# Patient Record
Sex: Female | Born: 1950 | Race: Black or African American | Hispanic: No | Marital: Married | Smoking: Former smoker
Health system: Southern US, Community
[De-identification: ages and names within clinical notes are randomized; demographics above are authoritative.]

## PROBLEM LIST (undated history)

## (undated) DIAGNOSIS — E785 Hyperlipidemia, unspecified: Secondary | ICD-10-CM

## (undated) DIAGNOSIS — I10 Essential (primary) hypertension: Secondary | ICD-10-CM

## (undated) DIAGNOSIS — I255 Ischemic cardiomyopathy: Secondary | ICD-10-CM

## (undated) DIAGNOSIS — I2510 Atherosclerotic heart disease of native coronary artery without angina pectoris: Secondary | ICD-10-CM

## (undated) DIAGNOSIS — D649 Anemia, unspecified: Secondary | ICD-10-CM

## (undated) HISTORY — DX: Essential (primary) hypertension: I10

## (undated) HISTORY — DX: Anemia, unspecified: D64.9

---

## 1998-01-05 HISTORY — PX: ANGIOPLASTY: SHX39

## 2000-05-30 ENCOUNTER — Emergency Department (HOSPITAL_COMMUNITY): Admission: EM | Admit: 2000-05-30 | Discharge: 2000-05-30 | Payer: Self-pay | Admitting: Emergency Medicine

## 2000-07-08 ENCOUNTER — Encounter: Payer: Self-pay | Admitting: Emergency Medicine

## 2000-07-08 ENCOUNTER — Emergency Department (HOSPITAL_COMMUNITY): Admission: EM | Admit: 2000-07-08 | Discharge: 2000-07-08 | Payer: Self-pay | Admitting: Emergency Medicine

## 2001-07-15 ENCOUNTER — Emergency Department (HOSPITAL_COMMUNITY): Admission: EM | Admit: 2001-07-15 | Discharge: 2001-07-15 | Payer: Self-pay | Admitting: Emergency Medicine

## 2001-07-15 ENCOUNTER — Encounter: Payer: Self-pay | Admitting: Emergency Medicine

## 2001-10-08 ENCOUNTER — Emergency Department (HOSPITAL_COMMUNITY): Admission: EM | Admit: 2001-10-08 | Discharge: 2001-10-08 | Payer: Self-pay | Admitting: Emergency Medicine

## 2002-10-24 ENCOUNTER — Encounter: Admission: RE | Admit: 2002-10-24 | Discharge: 2002-10-24 | Payer: Self-pay | Admitting: Internal Medicine

## 2002-10-24 ENCOUNTER — Encounter: Payer: Self-pay | Admitting: Internal Medicine

## 2003-12-18 ENCOUNTER — Emergency Department (HOSPITAL_COMMUNITY): Admission: EM | Admit: 2003-12-18 | Discharge: 2003-12-18 | Payer: Self-pay | Admitting: Emergency Medicine

## 2004-02-07 ENCOUNTER — Emergency Department (HOSPITAL_COMMUNITY): Admission: EM | Admit: 2004-02-07 | Discharge: 2004-02-07 | Payer: Self-pay | Admitting: Emergency Medicine

## 2004-02-08 ENCOUNTER — Ambulatory Visit (HOSPITAL_COMMUNITY): Admission: RE | Admit: 2004-02-08 | Discharge: 2004-02-08 | Payer: Self-pay | Admitting: Emergency Medicine

## 2004-08-26 ENCOUNTER — Emergency Department (HOSPITAL_COMMUNITY): Admission: EM | Admit: 2004-08-26 | Discharge: 2004-08-26 | Payer: Self-pay | Admitting: Emergency Medicine

## 2006-09-28 ENCOUNTER — Other Ambulatory Visit: Admission: RE | Admit: 2006-09-28 | Discharge: 2006-09-28 | Payer: Self-pay | Admitting: Obstetrics and Gynecology

## 2010-01-26 ENCOUNTER — Encounter: Payer: Self-pay | Admitting: Internal Medicine

## 2012-02-10 ENCOUNTER — Ambulatory Visit: Payer: BC Managed Care – PPO | Admitting: Family Medicine

## 2012-02-10 ENCOUNTER — Ambulatory Visit: Payer: BC Managed Care – PPO

## 2012-02-10 VITALS — BP 179/94 | HR 80 | Temp 98.4°F | Resp 17 | Ht 63.5 in | Wt 141.0 lb

## 2012-02-10 DIAGNOSIS — S92911A Unspecified fracture of right toe(s), initial encounter for closed fracture: Secondary | ICD-10-CM

## 2012-02-10 DIAGNOSIS — I10 Essential (primary) hypertension: Secondary | ICD-10-CM

## 2012-02-10 DIAGNOSIS — S92919A Unspecified fracture of unspecified toe(s), initial encounter for closed fracture: Secondary | ICD-10-CM

## 2012-02-10 DIAGNOSIS — M79674 Pain in right toe(s): Secondary | ICD-10-CM

## 2012-02-10 DIAGNOSIS — M79609 Pain in unspecified limb: Secondary | ICD-10-CM

## 2012-02-10 NOTE — Progress Notes (Signed)
Discussed with server Weber PA. The patient stubbed the toe pretty well. X-ray does reveal an apparent fracture of the proximal phalanx of the fifth toe. Treatment was discussed. Agree with the PAs assessment.

## 2012-02-10 NOTE — Progress Notes (Signed)
   [address], [place] [address]   Phone [phone]  Subjective:    Patient ID: Diamond Hughes, female    DOB: May 08, 1950, 62 y.o.   MRN: [id number]  HPI  Pt presents to clinic with R 5th toe pain after she ran it into her bed Sunday night at home - She was limping at work due to the pain and they sent her here because they stated her limping was a hazard.  She has done nothing for her toe. Pt has h/o HTN and is on daily medications.    Review of Systems  Musculoskeletal: Positive for gait problem (2nd to pain). Negative for joint swelling.      Objective:   Physical Exam  Constitutional: She is oriented to person, place, and time. She appears well-developed and well-nourished.  HENT:  Head: Normocephalic and atraumatic.  Right Ear: External ear normal.  Left Ear: External ear normal.  Musculoskeletal:       5th toe TTP without swelling or deformity.  Some TTP of 4th toe at the MTP.  Neurological: She is alert and oriented to person, place, and time.  Skin: Skin is warm and dry.    [facility] reading (PRIMARY) by  Dr. Alwyn Ren.  Fracture 5th proximal phalanx that does not involve the MTP joint.        Assessment & Plan:   1. Toe pain, right  DG Foot Complete Right  2. Fracture of right toe    3. HTN (hypertension)     1/2- post op shoe and buddy taping.  Note given for work today because pt is not allowed to wear open toe shoes and she is wearing rocker bottom shoes today.  She can use OTC NSAIDs. 3- pt to monitor her BP - she may need an increase in her medication.

## 2016-02-02 ENCOUNTER — Emergency Department (HOSPITAL_COMMUNITY): Payer: Self-pay

## 2016-02-02 ENCOUNTER — Encounter (HOSPITAL_COMMUNITY): Payer: Self-pay | Admitting: Emergency Medicine

## 2016-02-02 ENCOUNTER — Observation Stay (HOSPITAL_COMMUNITY)
Admission: EM | Admit: 2016-02-02 | Discharge: 2016-02-03 | Discharge status: Against Medical Advice | Payer: Self-pay | Attending: Internal Medicine | Admitting: Internal Medicine

## 2016-02-02 DIAGNOSIS — D649 Anemia, unspecified: Secondary | ICD-10-CM | POA: Insufficient documentation

## 2016-02-02 DIAGNOSIS — I10 Essential (primary) hypertension: Secondary | ICD-10-CM | POA: Insufficient documentation

## 2016-02-02 DIAGNOSIS — F17210 Nicotine dependence, cigarettes, uncomplicated: Secondary | ICD-10-CM | POA: Insufficient documentation

## 2016-02-02 DIAGNOSIS — E876 Hypokalemia: Secondary | ICD-10-CM | POA: Insufficient documentation

## 2016-02-02 DIAGNOSIS — I252 Old myocardial infarction: Secondary | ICD-10-CM | POA: Insufficient documentation

## 2016-02-02 DIAGNOSIS — R079 Chest pain, unspecified: Principal | ICD-10-CM | POA: Insufficient documentation

## 2016-02-02 DIAGNOSIS — E871 Hypo-osmolality and hyponatremia: Secondary | ICD-10-CM | POA: Insufficient documentation

## 2016-02-02 DIAGNOSIS — I2510 Atherosclerotic heart disease of native coronary artery without angina pectoris: Secondary | ICD-10-CM | POA: Insufficient documentation

## 2016-02-02 DIAGNOSIS — Z955 Presence of coronary angioplasty implant and graft: Secondary | ICD-10-CM | POA: Insufficient documentation

## 2016-02-02 LAB — CBC
HEMATOCRIT: 32.2 % — AB (ref 36.0–46.0)
HEMOGLOBIN: 10.6 g/dL — AB (ref 12.0–15.0)
MCH: 31.6 pg (ref 26.0–34.0)
MCHC: 32.9 g/dL (ref 30.0–36.0)
MCV: 96.1 fL (ref 78.0–100.0)
PLATELETS: 447 10*3/uL — AB (ref 150–400)
RBC: 3.35 MIL/uL — AB (ref 3.87–5.11)
RDW: 12.8 % (ref 11.5–15.5)
WBC: 7.6 10*3/uL (ref 4.0–10.5)

## 2016-02-02 LAB — HEPATIC FUNCTION PANEL
ALBUMIN: 4.3 g/dL (ref 3.5–5.0)
ALK PHOS: 35 U/L — AB (ref 38–126)
ALT: 14 U/L (ref 14–54)
AST: 25 U/L (ref 15–41)
Bilirubin, Direct: <0.1 mg/dL — ABNORMAL LOW (ref 0.1–0.5)
TOTAL PROTEIN: 7.8 g/dL (ref 6.5–8.1)
Total Bilirubin: <0.1 mg/dL — ABNORMAL LOW (ref 0.3–1.2)

## 2016-02-02 LAB — I-STAT TROPONIN, ED: Troponin i, poc: 0.04 ng/mL (ref 0.00–0.08)

## 2016-02-02 LAB — LIPASE, BLOOD: Lipase: 30 U/L (ref 11–51)

## 2016-02-02 LAB — BASIC METABOLIC PANEL
Anion gap: 10 (ref 5–15)
BUN: 15 mg/dL (ref 6–20)
CHLORIDE: 98 mmol/L — AB (ref 101–111)
CO2: 25 mmol/L (ref 22–32)
CREATININE: 0.78 mg/dL (ref 0.44–1.00)
Calcium: 8.9 mg/dL (ref 8.9–10.3)
GFR calc non Af Amer: >60 mL/min (ref >60)
Glucose, Bld: 103 mg/dL — ABNORMAL HIGH (ref 65–99)
POTASSIUM: 3.3 mmol/L — AB (ref 3.5–5.1)
Sodium: 133 mmol/L — ABNORMAL LOW (ref 135–145)

## 2016-02-02 MED ORDER — NITROGLYCERIN 2 % TD OINT
1.0000 [in_us] | TOPICAL_OINTMENT | Freq: Once | TRANSDERMAL | Status: AC
  Administered 2016-02-02: 1 [in_us] via TOPICAL
  Filled 2016-02-02: qty 1

## 2016-02-02 NOTE — ED Triage Notes (Addendum)
Pt reports chest pain that started yesterday. Pt reports tightness and pressure substernally that radiates into back. Pt stated she had prior stents placed after an MI 18 years ago. Pt also reported shortness of breath and dizziness. Pt reports taking 325mg  ASA at 2130

## 2016-02-02 NOTE — ED Notes (Signed)
Dr. James Kim, hospitalist, at bedside. 

## 2016-02-02 NOTE — H&P (Signed)
[facility] H&P   Patient Demographics:    Diamond Hughes, is a 66 y.o. female  MRN: [id number]   DOB - 07-Sep-1950  Admit Date - 02/02/2016  Outpatient Primary MD for the patient is No primary care provider on file.  Referring MD/NP/PA: Dr. Aileen PilotZammitt  Outpatient Specialists:   Patient coming from: home  Chief Complaint  Patient presents with  . Chest Pain      HPI:    Diamond Hughes  is a 66 y.o. female, w hx of CAD s/p stent  ([place], [place]), apparently c/o chest pain substernal, without radiation. "achy and pressure"   ? Slight back pain as well. ,pt not sure if related to chest pain.  Pt was sitting watching TV when the pain began.  Started about 9pm.  Lasted about 15 minutes. Aspirin might have helped.  Otherwise nothing appeared to make the pain better or worse. Slight dyspnea.  Denies fever, chill, cough, palp, n/v, heartburn.   In ED , CXR negative. EKG nsr at 75, nl axis, q in v1, v2, and slight st depression the lateral leads.  Trop negative.  Pt noted to have mild anemia with hgb 10.6  And hypokalemia, K=3.3.  Pt will be admitted for CP     Review of systems:    In addition to the HPI above,  No Fever-chills, No Headache, No changes with Vision or hearing, No problems swallowing food or Liquids, No Abdominal pain, No Nausea or Vommitting, Bowel movements are regular, No Blood in stool or Urine, No dysuria, No new skin rashes or bruises, No new joints pains-aches,  No new weakness, tingling, numbness in any extremity, No recent weight gain or loss, No polyuria, polydypsia or polyphagia, No significant Mental Stressors.  A full 10 point Review of Systems was done, except as stated above, all other Review of Systems were negative.   With Past History of the following :    Past Medical History:  Diagnosis Date  . Hypertension   . MI (myocardial infarction)        Past Surgical History:  Procedure Laterality Date  . ANGIOPLASTY  2000      Social History:     Social History  Substance Use Topics  . Smoking status: Current Every Day Smoker    Packs/day: 0.20    Years: 15.00    Types: Cigarettes  . Smokeless tobacco: Never Used  . Alcohol use Yes     Comment: 40oz beer     Lives - at home  Mobility - walks by self     Family History :     Family History  Problem Relation Age of Onset  . Heart disease Mother       Home Medications:   Prior to Admission medications   Not on File     Allergies:    No Known Allergies   Physical Exam:  Vitals  Blood pressure 130/78, pulse 69, temperature 97.8 F (36.6 C), temperature source Oral, resp. rate 14, height 5\' 3"  (1.6 m), weight 68.9 kg (152 lb), SpO2 95 %.   1. General  lying in bed in NAD,    2. Normal affect and insight, Not Suicidal or Homicidal, Awake Alert, Oriented X 3.  3. No F.N deficits, ALL C.Nerves Intact, Strength 5/5 all 4 extremities, Sensation intact all 4 extremities, Plantars down going.  4. Ears and Eyes appear Normal, Conjunctivae clear, PERRLA. Moist Oral Mucosa.  5. Supple Neck, No JVD, No cervical lymphadenopathy appriciated, No Carotid Bruits.  6. Symmetrical Chest wall movement, Good air movement bilaterally, CTAB.  7. RRR, No Gallops, Rubs or Murmurs, No Parasternal Heave.  8. Positive Bowel Sounds, Abdomen Soft, No tenderness, No organomegaly appriciated,No rebound -guarding or rigidity.  9.  No Cyanosis, Normal Skin Turgor, No Skin Rash or Bruise.  10. Good muscle tone,  joints appear normal , no effusions, Normal ROM.  11. No Palpable Lymph Nodes in Neck or Axillae    Data Review:    CBC  Recent Labs Lab 02/02/16 2154  WBC 7.6  HGB 10.6*  HCT 32.2*  PLT 447*  MCV 96.1  MCH 31.6  MCHC 32.9  RDW 12.8    ------------------------------------------------------------------------------------------------------------------  Chemistries   Recent Labs Lab 02/02/16 2154  NA 133*  K 3.3*  CL 98*  CO2 25  GLUCOSE 103*  BUN 15  CREATININE 0.78  CALCIUM 8.9  AST 25  ALT 14  ALKPHOS 35*  BILITOT <0.1*   ------------------------------------------------------------------------------------------------------------------ estimated creatinine clearance is 67 mL/min (by C-G formula based on SCr of 0.78 mg/dL). ------------------------------------------------------------------------------------------------------------------ No results for input(s): TSH, T4TOTAL, T3FREE, THYROIDAB in the last 72 hours.  Invalid input(s): FREET3  Coagulation profile No results for input(s): INR, PROTIME in the last 168 hours. ------------------------------------------------------------------------------------------------------------------- No results for input(s): DDIMER in the last 72 hours. -------------------------------------------------------------------------------------------------------------------  Cardiac Enzymes No results for input(s): CKMB, TROPONINI, MYOGLOBIN in the last 168 hours.  Invalid input(s): CK ------------------------------------------------------------------------------------------------------------------ No results found for: BNP   ---------------------------------------------------------------------------------------------------------------  Urinalysis No results found for: COLORURINE, APPEARANCEUR, LABSPEC, PHURINE, GLUCOSEU, HGBUR, BILIRUBINUR, KETONESUR, PROTEINUR, UROBILINOGEN, NITRITE, LEUKOCYTESUR  ----------------------------------------------------------------------------------------------------------------   Imaging Results:    Dg Chest 2 View  Result Date: 02/02/2016 CLINICAL DATA:  Shortness of breath with cough and congestion 2 days. EXAM: CHEST  2 VIEW  COMPARISON:  12/18/2003 FINDINGS: Lungs are adequately inflated without focal airspace consolidation or effusion. Borderline stable cardiomegaly. Mild degenerate change of the spine. IMPRESSION: No active cardiopulmonary disease. Electronically Signed   By: Elberta Fortis M.D.   On: 02/02/2016 22:28      Assessment & Plan:    Active Problems:   Chest pain    1. CP Tele Trop I q6h x3 Cardiac echo NPO  After midnite Nuclear stress test in am Start aspirin 325mg  poq day, carvedilol 3.125mg  po bid, lipitor 80mg  po qhs.  Check lipid panel  2. Hypokalemia Replete Check cmp in am  3. Anemia Check cbc in am  4.  Hyponatremia Check cmp in am   DVT Prophylaxis  Lovenox - SCDs   AM Labs Ordered, also please review Full Orders  Family Communication: Admission, patients condition and plan of care including tests being ordered have been discussed with the patient  who indicate understanding and agree with the plan and Code Status.  Code Status FULL CODE  Likely DC to  home  Condition GUARDED    Consults called:  Admission status: observation  Time spent in minutes :   Pearson Grippe M.D on 02/02/2016 at 11:18 PM  Between 7am to 7pm - Pager - [phone]  After 7pm go to [email or web address] - password [facility]  [facility] - Office  [phone]

## 2016-02-02 NOTE — ED Provider Notes (Signed)
[facility]-EMERGENCY DEPT Provider Note   CSN: [id number] Arrival date & time: 02/02/16  2143     History   Chief Complaint Chief Complaint  Patient presents with  . Chest Pain    HPI Diamond Hughes is a 66 y.o. female.  Patient states that she had some chest pain yesterday for a short period time and then today she had severe chest pressure for about 15 minutes. Patient states that that this was similar to the discomfort she had 18 years ago when she had a stent placed   The history is provided by the patient. No language interpreter was used.  Chest Pain   This is a new problem. The current episode started less than 1 hour ago. The problem occurs rarely. The problem has been resolved. The pain is associated with rest. The pain is present in the substernal region. The pain is at a severity of 8/10. The pain is severe. The quality of the pain is described as heavy. The pain does not radiate. Pertinent negatives include no abdominal pain, no back pain, no cough and no headaches.  Pertinent negatives for past medical history include no seizures.    Past Medical History:  Diagnosis Date  . Hypertension   . MI (myocardial infarction)     Patient Active Problem List   Diagnosis Date Noted  . Chest pain 02/02/2016  . HTN (hypertension) 02/10/2012    Past Surgical History:  Procedure Laterality Date  . ANGIOPLASTY  2000    OB History    No data available       Home Medications    Prior to Admission medications   Not on File    Family History Family History  Problem Relation Age of Onset  . Heart disease Mother     Social History Social History  Substance Use Topics  . Smoking status: Current Every Day Smoker    Packs/day: 0.20    Years: 15.00    Types: Cigarettes  . Smokeless tobacco: Never Used  . Alcohol use Yes     Comment: 40oz beer     Allergies   Patient has no known allergies.   Review of Systems Review of Systems  Constitutional: Negative  for appetite change and fatigue.  HENT: Negative for congestion, ear discharge and sinus pressure.   Eyes: Negative for discharge.  Respiratory: Negative for cough.   Cardiovascular: Positive for chest pain.  Gastrointestinal: Negative for abdominal pain and diarrhea.  Genitourinary: Negative for frequency and hematuria.  Musculoskeletal: Negative for back pain.  Skin: Negative for rash.  Neurological: Negative for seizures and headaches.  Psychiatric/Behavioral: Negative for hallucinations.     Physical Exam Updated Vital Signs BP 130/78   Pulse 69   Temp 97.8 F (36.6 C) (Oral)   Resp 14   Ht 5\' 3"  (1.6 m)   Wt 152 lb (68.9 kg)   SpO2 95%   BMI 26.93 kg/m   Physical Exam  Constitutional: She is oriented to person, place, and time. She appears well-developed.  HENT:  Head: Normocephalic.  Eyes: Conjunctivae and EOM are normal. No scleral icterus.  Neck: Neck supple. No thyromegaly present.  Cardiovascular: Normal rate and regular rhythm.  Exam reveals no gallop and no friction rub.   No murmur heard. Pulmonary/Chest: No stridor. She has no wheezes. She has no rales. She exhibits no tenderness.  Abdominal: She exhibits no distension. There is no tenderness. There is no rebound.  Musculoskeletal: Normal range of motion. She exhibits  no edema.  Lymphadenopathy:    She has no cervical adenopathy.  Neurological: She is oriented to person, place, and time. She exhibits normal muscle tone. Coordination normal.  Skin: No rash noted. No erythema.  Psychiatric: She has a normal mood and affect. Her behavior is normal.     ED Treatments / Results  Labs (all labs ordered are listed, but only abnormal results are displayed) Labs Reviewed  BASIC METABOLIC PANEL - Abnormal; Notable for the following:       Result Value   Sodium 133 (*)    Potassium 3.3 (*)    Chloride 98 (*)    Glucose, Bld 103 (*)    All other components within normal limits  CBC - Abnormal; Notable for  the following:    RBC 3.35 (*)    Hemoglobin 10.6 (*)    HCT 32.2 (*)    Platelets 447 (*)    All other components within normal limits  HEPATIC FUNCTION PANEL - Abnormal; Notable for the following:    Alkaline Phosphatase 35 (*)    Total Bilirubin <0.1 (*)    Bilirubin, Direct <0.1 (*)    All other components within normal limits  LIPASE, BLOOD  I-STAT TROPOININ, ED    EKG  EKG Interpretation  Date/Time:  Sunday February 02 2016 21:47:59 EST Ventricular Rate:  76 PR Interval:    QRS Duration: 92 QT Interval:  420 QTC Calculation: 473 R Axis:   77 Text Interpretation:  Sinus rhythm Minimal ST depression, anterolateral leads Confirmed by Pailynn Vahey  MD, Kamsiyochukwu Buist [phone]([id number]) on 02/02/2016 9:55:13 PM       Radiology Dg Chest 2 View  Result Date: 02/02/2016 CLINICAL DATA:  Shortness of breath with cough and congestion 2 days. EXAM: CHEST  2 VIEW COMPARISON:  12/18/2003 FINDINGS: Lungs are adequately inflated without focal airspace consolidation or effusion. Borderline stable cardiomegaly. Mild degenerate change of the spine. IMPRESSION: No active cardiopulmonary disease. Electronically Signed   By: Elberta Fortisaniel  Boyle M.D.   On: 02/02/2016 22:28    Procedures Procedures (including critical care time)  Medications Ordered in ED Medications  nitroGLYCERIN (NITROGLYN) 2 % ointment 1 inch (1 inch Topical Given 02/02/16 2203)     Initial Impression / Assessment and Plan / ED Course  I have reviewed the triage vital signs and the nursing notes.  Pertinent labs & imaging results that were available during my care of the patient were reviewed by me and considered in my medical decision making (see chart for details).     Patient with chest pain that has resolved. EKG shows minor ST depression V4 V5 labs normal. Patient will be admitted to observation for chest pain  Final Clinical Impressions(s) / ED Diagnoses   Final diagnoses:  Acute chest pain    New Prescriptions New Prescriptions     No medications on file     Bethann BerkshireJoseph Everest Brod, MD 02/02/16 2313

## 2016-02-02 NOTE — ED Notes (Signed)
Pt verbalized that she does not want to be hospitalized.  AMA explained--- pt insisted she wanted to go home.  Dr. Selena BattenKim was made aware of pt's wanting to go home AMA.

## 2016-02-08 ENCOUNTER — Inpatient Hospital Stay (HOSPITAL_COMMUNITY)
Admission: EM | Admit: 2016-02-08 | Discharge: 2016-02-12 | DRG: 247 | Disposition: A | Discharge status: Home / Self Care | Payer: Self-pay | Attending: Cardiology | Admitting: Cardiology

## 2016-02-08 ENCOUNTER — Encounter (HOSPITAL_COMMUNITY): Payer: Self-pay | Admitting: Emergency Medicine

## 2016-02-08 DIAGNOSIS — I10 Essential (primary) hypertension: Secondary | ICD-10-CM | POA: Diagnosis present

## 2016-02-08 DIAGNOSIS — D649 Anemia, unspecified: Secondary | ICD-10-CM

## 2016-02-08 DIAGNOSIS — Z79899 Other long term (current) drug therapy: Secondary | ICD-10-CM

## 2016-02-08 DIAGNOSIS — I2510 Atherosclerotic heart disease of native coronary artery without angina pectoris: Secondary | ICD-10-CM

## 2016-02-08 DIAGNOSIS — I214 Non-ST elevation (NSTEMI) myocardial infarction: Principal | ICD-10-CM | POA: Diagnosis present

## 2016-02-08 DIAGNOSIS — E1169 Type 2 diabetes mellitus with other specified complication: Secondary | ICD-10-CM

## 2016-02-08 DIAGNOSIS — F17210 Nicotine dependence, cigarettes, uncomplicated: Secondary | ICD-10-CM | POA: Diagnosis present

## 2016-02-08 DIAGNOSIS — I249 Acute ischemic heart disease, unspecified: Secondary | ICD-10-CM

## 2016-02-08 DIAGNOSIS — Z955 Presence of coronary angioplasty implant and graft: Secondary | ICD-10-CM

## 2016-02-08 DIAGNOSIS — I252 Old myocardial infarction: Secondary | ICD-10-CM

## 2016-02-08 DIAGNOSIS — E785 Hyperlipidemia, unspecified: Secondary | ICD-10-CM | POA: Diagnosis present

## 2016-02-08 DIAGNOSIS — I255 Ischemic cardiomyopathy: Secondary | ICD-10-CM | POA: Diagnosis present

## 2016-02-08 HISTORY — DX: Ischemic cardiomyopathy: I25.5

## 2016-02-08 HISTORY — DX: Hyperlipidemia, unspecified: E78.5

## 2016-02-08 HISTORY — DX: Atherosclerotic heart disease of native coronary artery without angina pectoris: I25.10

## 2016-02-08 LAB — I-STAT TROPONIN, ED: TROPONIN I, POC: 2.55 ng/mL — AB (ref 0.00–0.08)

## 2016-02-08 LAB — BASIC METABOLIC PANEL
Anion gap: 11 (ref 5–15)
BUN: 8 mg/dL (ref 6–20)
CALCIUM: 9.5 mg/dL (ref 8.9–10.3)
CO2: 25 mmol/L (ref 22–32)
Chloride: 97 mmol/L — ABNORMAL LOW (ref 101–111)
Creatinine, Ser: 0.81 mg/dL (ref 0.44–1.00)
GFR calc Af Amer: >60 mL/min (ref >60)
GLUCOSE: 120 mg/dL — AB (ref 65–99)
Potassium: 4.3 mmol/L (ref 3.5–5.1)
Sodium: 133 mmol/L — ABNORMAL LOW (ref 135–145)

## 2016-02-08 LAB — CBC
HCT: 35.1 % — ABNORMAL LOW (ref 36.0–46.0)
Hemoglobin: 11.6 g/dL — ABNORMAL LOW (ref 12.0–15.0)
MCH: 32 pg (ref 26.0–34.0)
MCHC: 33 g/dL (ref 30.0–36.0)
MCV: 96.7 fL (ref 78.0–100.0)
Platelets: 446 10*3/uL — ABNORMAL HIGH (ref 150–400)
RBC: 3.63 MIL/uL — ABNORMAL LOW (ref 3.87–5.11)
RDW: 13 % (ref 11.5–15.5)
WBC: 7.6 10*3/uL (ref 4.0–10.5)

## 2016-02-08 LAB — MRSA PCR SCREENING: MRSA by PCR: NEGATIVE

## 2016-02-08 LAB — TROPONIN I: Troponin I: 2.89 ng/mL (ref <0.03)

## 2016-02-08 LAB — HEPARIN LEVEL (UNFRACTIONATED): Heparin Unfractionated: 0.25 IU/mL — ABNORMAL LOW (ref 0.30–0.70)

## 2016-02-08 MED ORDER — NITROGLYCERIN IN D5W 200-5 MCG/ML-% IV SOLN
0.0000 ug/min | INTRAVENOUS | Status: DC
  Administered 2016-02-08: 5 ug/min via INTRAVENOUS
  Filled 2016-02-08 (×2): qty 250

## 2016-02-08 MED ORDER — ATORVASTATIN CALCIUM 80 MG PO TABS
80.0000 mg | ORAL_TABLET | Freq: Every day | ORAL | Status: DC
  Administered 2016-02-08 – 2016-02-11 (×4): 80 mg via ORAL
  Filled 2016-02-08 (×4): qty 1

## 2016-02-08 MED ORDER — HEPARIN BOLUS VIA INFUSION
3000.0000 [IU] | Freq: Once | INTRAVENOUS | Status: AC
  Administered 2016-02-08: 3000 [IU] via INTRAVENOUS
  Filled 2016-02-08 (×2): qty 3000

## 2016-02-08 MED ORDER — HEPARIN (PORCINE) IN NACL 100-0.45 UNIT/ML-% IJ SOLN
1100.0000 [IU]/h | INTRAMUSCULAR | Status: DC
  Administered 2016-02-09: 1100 [IU]/h via INTRAVENOUS
  Filled 2016-02-08 (×2): qty 250

## 2016-02-08 MED ORDER — NITROGLYCERIN 0.4 MG SL SUBL
0.4000 mg | SUBLINGUAL_TABLET | SUBLINGUAL | Status: DC | PRN
  Administered 2016-02-10: 0.4 mg via SUBLINGUAL
  Filled 2016-02-08: qty 1

## 2016-02-08 MED ORDER — NITROGLYCERIN IN D5W 200-5 MCG/ML-% IV SOLN: 0.0000 ug/min | INTRAVENOUS | Status: DC

## 2016-02-08 MED ORDER — NITROGLYCERIN 2 % TD OINT
1.0000 [in_us] | TOPICAL_OINTMENT | Freq: Once | TRANSDERMAL | Status: AC
  Administered 2016-02-08: 1 [in_us] via TOPICAL
  Filled 2016-02-08: qty 1

## 2016-02-08 MED ORDER — ASPIRIN EC 81 MG PO TBEC
81.0000 mg | DELAYED_RELEASE_TABLET | Freq: Every day | ORAL | Status: DC
  Administered 2016-02-09 – 2016-02-12 (×3): 81 mg via ORAL
  Filled 2016-02-08 (×4): qty 1

## 2016-02-08 MED ORDER — ONDANSETRON HCL 4 MG/2ML IJ SOLN
4.0000 mg | Freq: Four times a day (QID) | INTRAMUSCULAR | Status: DC | PRN
  Administered 2016-02-10: 4 mg via INTRAVENOUS
  Filled 2016-02-08: qty 2

## 2016-02-08 MED ORDER — HEPARIN (PORCINE) IN NACL 100-0.45 UNIT/ML-% IJ SOLN
800.0000 [IU]/h | INTRAMUSCULAR | Status: DC
  Administered 2016-02-08: 800 [IU]/h via INTRAVENOUS
  Filled 2016-02-08 (×2): qty 250

## 2016-02-08 MED ORDER — HYDROCHLOROTHIAZIDE 12.5 MG PO CAPS: 12.5000 mg | ORAL_CAPSULE | Freq: Every day | ORAL | Status: DC

## 2016-02-08 MED ORDER — NITROGLYCERIN 0.4 MG SL SUBL
0.4000 mg | SUBLINGUAL_TABLET | SUBLINGUAL | Status: AC | PRN
  Administered 2016-02-08 – 2016-02-10 (×3): 0.4 mg via SUBLINGUAL
  Filled 2016-02-08: qty 1

## 2016-02-08 MED ORDER — METOPROLOL TARTRATE 25 MG PO TABS
25.0000 mg | ORAL_TABLET | Freq: Two times a day (BID) | ORAL | Status: DC
  Administered 2016-02-08 – 2016-02-12 (×7): 25 mg via ORAL
  Filled 2016-02-08 (×9): qty 1

## 2016-02-08 MED ORDER — LISINOPRIL 20 MG PO TABS
20.0000 mg | ORAL_TABLET | Freq: Two times a day (BID) | ORAL | Status: DC
  Filled 2016-02-08: qty 1

## 2016-02-08 MED ORDER — ACETAMINOPHEN 325 MG PO TABS: 650.0000 mg | ORAL_TABLET | ORAL | Status: DC | PRN

## 2016-02-08 NOTE — ED Notes (Signed)
MD aware of patient complaints.

## 2016-02-08 NOTE — Progress Notes (Signed)
Pt arrives to rm 4n04 at this time.  Pt still c/o cp 5/10.  Applied o2 at 2l via [place] and increased ntg gtt.  Call bell in reach.

## 2016-02-08 NOTE — ED Notes (Signed)
Pt.I STAT TROPOINI 2.55 NG SHOWN TO DR.HAVILAND

## 2016-02-08 NOTE — ED Triage Notes (Signed)
Pt c/o center chest pain that radiates to back. [facility]

## 2016-02-08 NOTE — ED Notes (Signed)
Cardiology at bedside,  

## 2016-02-08 NOTE — ED Notes (Signed)
Pharmacy noted of meds needed

## 2016-02-08 NOTE — Progress Notes (Signed)
ANTICOAGULATION CONSULT NOTE  - follow up   Pharmacy Consult for Heparin Indication: chest pain/ACS  No Known Allergies  Patient Measurements: Height: 5\' 3"  (160 cm) Weight: 152 lb (68.9 kg) IBW/kg (Calculated) : 52.4  Vital Signs: Temp: 98.9 F (37.2 C) (02/03 1815) Temp Source: Oral (02/03 1815) BP: 120/82 (02/03 1815) Pulse Rate: 80 (02/03 1815)  Assessment: 66 yo F presents on 2/3 with radiating chest pain. Pharmacy consulted to start heparin gtt. No anticoag PTA. Hgb 11.6, plts 446. No s/s of bleed.   Heparin level = 0.25 on 800/hr but drawn only 4 hr after bolus & drip started - too early,  prefer 6-8 hour level .  No bleeding reported.    Goal of Therapy:  Heparin level 0.3-0.7 units/ml Monitor platelets by anticoagulation protocol: Yes   Plan:  Increase heparin gtt to 950 units/hr Monitor daily heparin level, CBC, s/s of bleed Check 6 hr heparin level  Noah Delaineuth Jessicalynn Deshong, RPh Clinical Pharmacist Pager: [phone] 02/08/2016 7:23 PM

## 2016-02-08 NOTE — Progress Notes (Signed)
ANTICOAGULATION CONSULT NOTE - Initial Consult  Pharmacy Consult for Heparin Indication: chest pain/ACS  No Known Allergies  Patient Measurements: Height: 5\' 3"  (160 cm) Weight: 152 lb (68.9 kg) IBW/kg (Calculated) : 52.4  Vital Signs: Temp: 98.2 F (36.8 C) (02/03 1030) Temp Source: Oral (02/03 1030) BP: 140/84 (02/03 1100) Pulse Rate: 71 (02/03 1115)  Assessment: 66 yo F presents on 2/3 with radiating chest pain. Pharmacy consulted to start heparin gtt. No anticoag PTA. Hgb 11.6, plts 446. No s/s of bleed.   Goal of Therapy:  Heparin level 0.3-0.7 units/ml Monitor platelets by anticoagulation protocol: Yes   Plan:  Give heparin 3,000 units bolus Start heparin gtt at 800 units/hr Monitor daily heparin level, CBC, s/s of bleed Check 6 hr heparin level  Diamond Hughes, PharmD, [facility] Clinical Pharmacist Pager [phone] 02/08/2016 11:39 AM

## 2016-02-08 NOTE — ED Provider Notes (Signed)
[facility]-EMERGENCY DEPT Provider Note   CSN: [id number] Arrival date & time: 02/08/16  1018     History   Chief Complaint Chief Complaint  Patient presents with  . Chest Pain    HPI Diamond Hughes is a 66 y.o. female.  Pt presents to the ED today with cp.  The pt was seen on 1/28 at [facility] for the same.  Admission was recommended and she initially agreed to stay.  However, pt does not have insurance and was concerned about cost, so she left AMA.  The pt said that she has had continued intermittent CP.  The pt denies sob.  The pt does have a hx of MI and this feels similar to how that felt.  Pt has not seen a cardiologist in "awhile."  The pt does see a free clinic in [place] where she gets her meds and said she's been taking them as directed.  She did quit smoking a few weeks ago.      Past Medical History:  Diagnosis Date  . Hypertension   . MI (myocardial infarction)     Patient Active Problem List   Diagnosis Date Noted  . Acute chest pain 02/02/2016  . HTN (hypertension) 02/10/2012    Past Surgical History:  Procedure Laterality Date  . ANGIOPLASTY  2000    OB History    No data available       Home Medications    Prior to Admission medications   Medication Sig Start Date End Date Taking? Authorizing Provider  aspirin 325 MG tablet Take 325 mg by mouth daily.   Yes Historical Provider, MD  hydrochlorothiazide (MICROZIDE) 12.5 MG capsule Take 12.5 mg by mouth daily.   Yes Historical Provider, MD  Ibuprofen-Diphenhydramine HCl (ADVIL PM) 200-25 MG CAPS Take 1 capsule by mouth at bedtime as needed (pain).   Yes Historical Provider, MD  lisinopril (PRINIVIL,ZESTRIL) 20 MG tablet Take 20 mg by mouth 2 (two) times daily.   Yes Historical Provider, MD  lovastatin (MEVACOR) 20 MG tablet Take 20 mg by mouth at bedtime.   Yes Historical Provider, MD  metoprolol tartrate (LOPRESSOR) 25 MG tablet Take 25 mg by mouth 2 (two) times daily.   Yes Historical  Provider, MD    Family History Family History  Problem Relation Age of Onset  . Heart disease Mother     Social History Social History  Substance Use Topics  . Smoking status: Current Every Day Smoker    Packs/day: 0.20    Years: 15.00    Types: Cigarettes  . Smokeless tobacco: Never Used  . Alcohol use Yes     Comment: 40oz beer     Allergies   Patient has no known allergies.   Review of Systems Review of Systems  Cardiovascular: Positive for chest pain.  All other systems reviewed and are negative.    Physical Exam Updated Vital Signs BP 129/86   Pulse 68   Temp 98.2 F (36.8 C) (Oral)   Resp 15   Ht 5\' 3"  (1.6 m)   Wt 152 lb (68.9 kg)   SpO2 100%   BMI 26.93 kg/m   Physical Exam  Constitutional: She is oriented to person, place, and time. She appears well-developed and well-nourished.  HENT:  Head: Normocephalic and atraumatic.  Right Ear: External ear normal.  Left Ear: External ear normal.  Nose: Nose normal.  Mouth/Throat: Oropharynx is clear and moist.  Eyes: Conjunctivae and EOM are normal. Pupils are equal, round,  and reactive to light.  Neck: Normal range of motion.  Cardiovascular: Normal rate, regular rhythm, normal heart sounds and intact distal pulses.   Pulmonary/Chest: Effort normal and breath sounds normal.  Abdominal: Soft. Bowel sounds are normal.  Musculoskeletal: Normal range of motion.  Neurological: She is alert and oriented to person, place, and time.  Skin: Skin is warm.  Psychiatric: She has a normal mood and affect. Her behavior is normal. Judgment and thought content normal.  Nursing note and vitals reviewed.    ED Treatments / Results  Labs (all labs ordered are listed, but only abnormal results are displayed) Labs Reviewed  BASIC METABOLIC PANEL - Abnormal; Notable for the following:       Result Value   Sodium 133 (*)    Chloride 97 (*)    Glucose, Bld 120 (*)    All other components within normal limits  CBC  - Abnormal; Notable for the following:    RBC 3.63 (*)    Hemoglobin 11.6 (*)    HCT 35.1 (*)    Platelets 446 (*)    All other components within normal limits  TROPONIN I - Abnormal; Notable for the following:    Troponin I 2.89 (*)    All other components within normal limits  I-STAT TROPOININ, ED - Abnormal; Notable for the following:    Troponin i, poc 2.55 (*)    All other components within normal limits  HEPARIN LEVEL (UNFRACTIONATED)    EKG  EKG Interpretation None      EKG not crossing through on MUSE:  HR:  72, t wave inversions inf and lat new since 1/28.  No st elevation.   Radiology No results found.  Procedures Procedures (including critical care time)  Medications Ordered in ED Medications  nitroGLYCERIN (NITROSTAT) SL tablet 0.4 mg (0.4 mg Sublingual Given 02/08/16 1111)  heparin bolus via infusion 3,000 Units (not administered)  heparin ADULT infusion 100 units/mL (25000 units/2750mL sodium chloride 0.45%) (not administered)  nitroGLYCERIN 50 mg in dextrose 5 % 250 mL (0.2 mg/mL) infusion (not administered)  nitroGLYCERIN (NITROGLYN) 2 % ointment 1 inch (1 inch Topical Given 02/08/16 1147)     Initial Impression / Assessment and Plan / ED Course  I have reviewed the triage vital signs and the nursing notes.  Pertinent labs & imaging results that were available during my care of the patient were reviewed by me and considered in my medical decision making (see chart for details).    Pain gone with 3rd nitro.  Pt has new EKG changes and elevated troponin.  Pt started on heparin.  Pt d/w cardiology (Dr. Shirlee LatchMcLean) who will see pt in the ED.  Cardiology will admit pt to the hospital.  They anticipate cath on the 5th.  Pt in agreement with plan.  Final Clinical Impressions(s) / ED Diagnoses   Final diagnoses:  ACS (acute coronary syndrome) [facility](HCC)    New Prescriptions New Prescriptions   No medications on file     Jacalyn LefevreJulie Magdelena Kinsella, MD 02/08/16 1302

## 2016-02-08 NOTE — H&P (Signed)
CARDIOLOGY CONSULT NOTE   Patient ID: BLAKELEIGH DOMEK MRN: [id number] DOB/AGE: 06-Apr-1950 66 y.o.  Admit date: 02/08/2016  Primary Physician   No PCP Per Patient Primary Cardiologist   None Reason for Consultation   NSTEMI Requesting Physician  Dr. Particia Nearing  HPI: Diamond Hughes is a 66 y.o. female with a history of CAD, HTN and ongoing tobacco abuse presents for chest pain.  History of MI in 2000 at [place], [place]. Status post stent placement. Unable to provide any further information. Patient has been followed by community clinic in [place], [place] for yearly checkup. No regular follow-up with cardiology. No stress test since then.  For the past few weeks patient has being having exertional substernal chest pressure with back pain. No associated shortness of breath. Patient came to the ER on 02/02/2016 for worsening symptoms. Recommended admission however left AMA. Since then, her symptoms has progressively gotten worse. Now constantly having chest pressure. Yesterday was worse today. This morning she woke up with the (worse episode) substernal chest pressure radiating to her back and shoulder. Associated with shortness of breath and diaphoresis. Chest pressure improved to 2 out of 10 currently after sublingual nitroglycerin x 2. EKG shows new T-wave inversion in inferior lateral lead. Point-of-care troponin 2.55. Hemoglobin 11.6. Serum creatinine and potassium normal. Started on IV heparin and Nitropaste.  She smokes ciggrate one pack a day for the past 20-25 years. Mother required CABG in her 67s.   Past Medical History:  Diagnosis Date  . Hypertension   . MI (myocardial infarction)      Past Surgical History:  Procedure Laterality Date  . ANGIOPLASTY  2000    No Known Allergies  I have reviewed the patient's current medications . heparin  3,000 Units Intravenous Once   . heparin     nitroGLYCERIN  Prior to Admission medications   Medication Sig  Start Date End Date Taking? Authorizing Provider  aspirin 325 MG tablet Take 325 mg by mouth daily.   Yes Historical Provider, MD  hydrochlorothiazide (MICROZIDE) 12.5 MG capsule Take 12.5 mg by mouth daily.   Yes Historical Provider, MD  Ibuprofen-Diphenhydramine HCl (ADVIL PM) 200-25 MG CAPS Take 1 capsule by mouth at bedtime as needed (pain).   Yes Historical Provider, MD  lisinopril (PRINIVIL,ZESTRIL) 20 MG tablet Take 20 mg by mouth 2 (two) times daily.   Yes Historical Provider, MD  lovastatin (MEVACOR) 20 MG tablet Take 20 mg by mouth at bedtime.   Yes Historical Provider, MD  metoprolol tartrate (LOPRESSOR) 25 MG tablet Take 25 mg by mouth 2 (two) times daily.   Yes Historical Provider, MD     Social History   Social History  . Marital status: Married    Spouse name: N/A  . Number of children: N/A  . Years of education: N/A   Occupational History  . Not on file.   Social History Main Topics  . Smoking status: Current Every Day Smoker    Packs/day: 0.20    Years: 15.00    Types: Cigarettes  . Smokeless tobacco: Never Used  . Alcohol use Yes     Comment: 40oz beer  . Drug use: No  . Sexual activity: Yes    Birth control/ protection: None   Other Topics Concern  . Not on file   Social History Narrative  . No narrative on file    Family Status  Relation Status  . Mother Alive  . Father Deceased  . Sister Alive  .  Brother Alive  . Daughter Alive  . Maternal Grandmother Deceased  . Maternal Grandfather Deceased  . Paternal Grandmother Deceased  . Paternal Grandfather Deceased   Family History  Problem Relation Age of Onset  . Heart disease Mother     ROS:  Full 14 point review of systems complete and found to be negative unless listed above.  Physical Exam: Blood pressure 113/78, pulse 71, temperature 98.2 F (36.8 C), temperature source Oral, resp. rate 13, height 5\' 3"  (1.6 m), weight 152 lb (68.9 kg), SpO2 98 %.  General: Well developed, well nourished,  female in no acute distress Head: Eyes PERRLA, No xanthomas. Normocephalic and atraumatic, oropharynx without edema or exudate.  Lungs: Resp regular and unlabored, CTA. Heart: RRR no s3, s4, or murmurs..   Neck: No carotid bruits. No lymphadenopathy.  No JVD. Abdomen: Bowel sounds present, abdomen soft and non-tender without masses or hernias noted. Msk:  No spine or cva tenderness. No weakness, no joint deformities or effusions. Extremities: No clubbing, cyanosis or edema. DP/PT/Radials 2+ and equal bilaterally. Neuro: Alert and oriented X 3. No focal deficits noted. Psych:  Good affect, responds appropriately Skin: No rashes or lesions noted.  Labs:   Lab Results  Component Value Date   WBC 7.6 02/08/2016   HGB 11.6 (L) 02/08/2016   HCT 35.1 (L) 02/08/2016   MCV 96.7 02/08/2016   PLT 446 (H) 02/08/2016   No results for input(s): INR in the last 72 hours.  Recent Labs Lab 02/02/16 2154 02/08/16 1101  NA 133* 133*  K 3.3* 4.3  CL 98* 97*  CO2 25 25  BUN 15 8  CREATININE 0.78 0.81  CALCIUM 8.9 9.5  PROT 7.8  --   BILITOT <0.1*  --   ALKPHOS 35*  --   ALT 14  --   AST 25  --   GLUCOSE 103* 120*  ALBUMIN 4.3  --    No results found for: MG No results for input(s): CKTOTAL, CKMB, TROPONINI in the last 72 hours.  Recent Labs  02/08/16 1058  TROPIPOC 2.55*   No results found for: PROBNP No results found for: CHOL, HDL, LDLCALC, TRIG No results found for: DDIMER Lipase  Date/Time Value Ref Range Status  02/02/2016 09:54 PM 30 11 - 51 U/L Final    Radiology:  No results found.  ASSESSMENT AND PLAN:     1. NSTEMI with prior history of stenting in 2000 - POC troponin of 2.55. Currently 2 out of 10 chest pressure. Continue IV heparin and Nitropaste. Admit and cycle troponin with serial EKG. Cath Monday. EKG with new TWI in inferior lateral leads compared to 02/02/16. Get echo.   2. HLD - No results found for requested labs within last 8760 hours. Will get lipid  panel. Start Lipitor 80mg  qd.  3. HTN - Stable on current regimen. Continue lopressor, lisinopril and HCTZ.   SignedManson Passey: Bhagat,Bhavinkumar, PA 02/08/2016, 12:03 PM Pager [phone]  Co-Sign MD  Patient seen with PA, agree with the above note.  Patient has prior history of CAD and is admitted with concerning chest pain.  ECG and troponin suggestive of NSTEMI.  She quit smoking 2 wks ago.  - Start heparin gtt.  - Start NTG gtt with ongoing mild pain.  - Continue metoprolol and lisinopril. - Start ASA + high dose statin.  - If pain subsides and she remains stable, cath on Monday.   Marca AnconaDalton Duan Scharnhorst 02/08/2016 12:39 PM

## 2016-02-08 NOTE — ED Notes (Signed)
Cards at bedside

## 2016-02-08 NOTE — ED Notes (Signed)
Patient refused 3rd dose.

## 2016-02-09 ENCOUNTER — Inpatient Hospital Stay (HOSPITAL_COMMUNITY): Payer: Self-pay

## 2016-02-09 DIAGNOSIS — R079 Chest pain, unspecified: Secondary | ICD-10-CM

## 2016-02-09 LAB — CBC
HCT: 30.9 % — ABNORMAL LOW (ref 36.0–46.0)
Hemoglobin: 10.2 g/dL — ABNORMAL LOW (ref 12.0–15.0)
MCH: 31.8 pg (ref 26.0–34.0)
MCHC: 33 g/dL (ref 30.0–36.0)
MCV: 96.3 fL (ref 78.0–100.0)
PLATELETS: 357 10*3/uL (ref 150–400)
RBC: 3.21 MIL/uL — ABNORMAL LOW (ref 3.87–5.11)
RDW: 12.9 % (ref 11.5–15.5)
WBC: 8.1 10*3/uL (ref 4.0–10.5)

## 2016-02-09 LAB — BASIC METABOLIC PANEL
Anion gap: 11 (ref 5–15)
BUN: 11 mg/dL (ref 6–20)
CALCIUM: 9.2 mg/dL (ref 8.9–10.3)
CO2: 26 mmol/L (ref 22–32)
CREATININE: 0.78 mg/dL (ref 0.44–1.00)
Chloride: 94 mmol/L — ABNORMAL LOW (ref 101–111)
GFR calc Af Amer: >60 mL/min (ref >60)
GFR calc non Af Amer: >60 mL/min (ref >60)
GLUCOSE: 134 mg/dL — AB (ref 65–99)
Potassium: 3.7 mmol/L (ref 3.5–5.1)
Sodium: 131 mmol/L — ABNORMAL LOW (ref 135–145)

## 2016-02-09 LAB — TROPONIN I: TROPONIN I: 6.99 ng/mL — AB (ref <0.03)

## 2016-02-09 LAB — ECHOCARDIOGRAM COMPLETE
HEIGHTINCHES: 63 in
WEIGHTICAEL: 2432 [oz_av]

## 2016-02-09 LAB — URINALYSIS, ROUTINE W REFLEX MICROSCOPIC
Bilirubin Urine: NEGATIVE
Glucose, UA: NEGATIVE mg/dL
Hgb urine dipstick: NEGATIVE
Ketones, ur: NEGATIVE mg/dL
Nitrite: NEGATIVE
PH: 8 (ref 5.0–8.0)
PROTEIN: NEGATIVE mg/dL
Specific Gravity, Urine: 1.011 (ref 1.005–1.030)

## 2016-02-09 LAB — HEPARIN LEVEL (UNFRACTIONATED)
HEPARIN UNFRACTIONATED: 0.21 [IU]/mL — AB (ref 0.30–0.70)
Heparin Unfractionated: 0.4 IU/mL (ref 0.30–0.70)
Heparin Unfractionated: 0.4 IU/mL (ref 0.30–0.70)

## 2016-02-09 LAB — LIPID PANEL
Cholesterol: 210 mg/dL — ABNORMAL HIGH (ref 0–200)
HDL: 81 mg/dL (ref >40)
LDL Cholesterol: 110 mg/dL — ABNORMAL HIGH (ref 0–99)
Total CHOL/HDL Ratio: 2.6 RATIO
Triglycerides: 95 mg/dL (ref <150)
VLDL: 19 mg/dL (ref 0–40)

## 2016-02-09 MED ORDER — LISINOPRIL 5 MG PO TABS
5.0000 mg | ORAL_TABLET | Freq: Every day | ORAL | Status: DC
  Administered 2016-02-09 – 2016-02-12 (×4): 5 mg via ORAL
  Filled 2016-02-09 (×4): qty 1

## 2016-02-09 MED ORDER — ASPIRIN 81 MG PO CHEW
81.0000 mg | CHEWABLE_TABLET | ORAL | Status: AC
  Administered 2016-02-10: 81 mg via ORAL
  Filled 2016-02-09: qty 1

## 2016-02-09 MED ORDER — SODIUM CHLORIDE 0.9 % WEIGHT BASED INFUSION
1.0000 mL/kg/h | INTRAVENOUS | Status: DC
  Administered 2016-02-10: 250 mL via INTRAVENOUS

## 2016-02-09 MED ORDER — SODIUM CHLORIDE 0.9 % IV SOLN: 250.0000 mL | INTRAVENOUS | Status: DC | PRN

## 2016-02-09 MED ORDER — SODIUM CHLORIDE 0.9% FLUSH
3.0000 mL | Freq: Two times a day (BID) | INTRAVENOUS | Status: DC
  Administered 2016-02-09 – 2016-02-10 (×2): 3 mL via INTRAVENOUS

## 2016-02-09 MED ORDER — SODIUM CHLORIDE 0.9% FLUSH: 3.0000 mL | INTRAVENOUS | Status: DC | PRN

## 2016-02-09 MED ORDER — SODIUM CHLORIDE 0.9 % WEIGHT BASED INFUSION
3.0000 mL/kg/h | INTRAVENOUS | Status: DC
  Administered 2016-02-10: 3 mL/kg/h via INTRAVENOUS

## 2016-02-09 NOTE — Progress Notes (Signed)
ANTICOAGULATION CONSULT NOTE - Follow Up Consult  Pharmacy Consult for Heparin  Indication: chest pain/ACS  No Known Allergies  Patient Measurements: Height: 5\' 3"  (160 cm) Weight: 152 lb (68.9 kg) IBW/kg (Calculated) : 52.4   Vital Signs: Temp: 97.9 F (36.6 C) (02/04 1100) Temp Source: Oral (02/04 1100) BP: 114/91 (02/04 1100) Pulse Rate: 72 (02/04 1100)  Labs:  Recent Labs  02/08/16 1101 02/08/16 1212 02/08/16 1826 02/09/16 0221 02/09/16 1104  HGB 11.6*  --   --  10.2*  --   HCT 35.1*  --   --  30.9*  --   PLT 446*  --   --  357  --   HEPARINUNFRC  --   --  0.25* 0.21* 0.40  CREATININE 0.81  --   --  0.78  --   TROPONINI  --  2.89*  --  6.99*  --     Estimated Creatinine Clearance: 67 mL/min (by C-G formula based on SCr of 0.78 mg/dL).  . heparin 1,100 Units/hr (02/09/16 1200)  . nitroGLYCERIN Stopped (02/09/16 0530)     Assessment: 66 yo female w/ hx MI on IV Heparin for NSTEMI.  Heparin level at goal.  Hgb with slight decline, but no bleeding or complications noted.  Awaiting cath tomorrow.  Goal of Therapy:  Heparin level 0.3-0.7 units/ml Monitor platelets by anticoagulation protocol: Yes   Plan:  Continue IV heparin at current rate. Confirm heparin level at 6 pm. Daily heparin level and CBC.  Tad MooreJessica Nichole Neyer, Pharm D, BCPS  Clinical Pharmacist Pager [phone]  02/09/2016 1:19 PM

## 2016-02-09 NOTE — Progress Notes (Signed)
Nitro titrated down to 10 mcg because of BP 82/48, patient resting, 0/10 chest pain.

## 2016-02-09 NOTE — Progress Notes (Signed)
Echocardiogram 2D Echocardiogram has been performed.  Diamond Hughes 02/09/2016, 1:30 PM

## 2016-02-09 NOTE — Progress Notes (Signed)
ANTICOAGULATION CONSULT NOTE - Follow Up Consult  Pharmacy Consult for Heparin  Indication: chest pain/ACS  No Known Allergies  Patient Measurements: Height: 5\' 3"  (160 cm) Weight: 152 lb (68.9 kg) IBW/kg (Calculated) : 52.4   Vital Signs: Temp: 97.8 F (36.6 C) (02/04 1500) Temp Source: Oral (02/04 1500) BP: 96/68 (02/04 1500) Pulse Rate: 85 (02/04 1500)  Labs:  Recent Labs  02/08/16 1101 02/08/16 1212  02/09/16 0221 02/09/16 1104 02/09/16 1805  HGB 11.6*  --   --  10.2*  --   --   HCT 35.1*  --   --  30.9*  --   --   PLT 446*  --   --  357  --   --   HEPARINUNFRC  --   --   < > 0.21* 0.40 0.40  CREATININE 0.81  --   --  0.78  --   --   TROPONINI  --  2.89*  --  6.99*  --   --   < > = values in this interval not displayed.  Estimated Creatinine Clearance: 67 mL/min (by C-G formula based on SCr of 0.78 mg/dL).  Diamond Hughes. [START ON 02/10/2016] sodium chloride     Followed by  . [START ON 02/10/2016] sodium chloride    . heparin 1,100 Units/hr (02/09/16 1800)  . nitroGLYCERIN Stopped (02/09/16 0530)     Assessment: 66 yo female w/ hx MI on IV Heparin for NSTEMI.  Heparin level confirmed at goal. Awaiting cath tomorrow.  Goal of Therapy:  Heparin level 0.3-0.7 units/ml Monitor platelets by anticoagulation protocol: Yes   Plan:  Continue IV heparin at current rate. Daily heparin level and CBC.  Harland GermanAndrew Mozell Haber, Pharm D 02/09/2016 7:31 PM

## 2016-02-09 NOTE — Progress Notes (Signed)
Nitro gtt turned off due to BP, 85/63, patient chest pain 0/10, resting comfortably.

## 2016-02-09 NOTE — Progress Notes (Signed)
C/o burning sensation on urination and urine is malodorous,NP made aware with order.

## 2016-02-09 NOTE — Progress Notes (Signed)
ANTICOAGULATION CONSULT NOTE - Follow Up Consult  Pharmacy Consult for Heparin  Indication: chest pain/ACS  No Known Allergies  Patient Measurements: Height: 5\' 3"  (160 cm) Weight: 152 lb (68.9 kg) IBW/kg (Calculated) : 52.4   Vital Signs: Temp: 97.9 F (36.6 C) (02/04 0300) Temp Source: Oral (02/04 0300) BP: 91/64 (02/04 0200) Pulse Rate: 64 (02/04 0200)  Labs:  Recent Labs  02/08/16 1101 02/08/16 1212 02/08/16 1826 02/09/16 0221  HGB 11.6*  --   --  10.2*  HCT 35.1*  --   --  30.9*  PLT 446*  --   --  357  HEPARINUNFRC  --   --  0.25* 0.21*  CREATININE 0.81  --   --  0.78  TROPONINI  --  2.89*  --   --     Estimated Creatinine Clearance: 67 mL/min (by C-G formula based on SCr of 0.78 mg/dL).   Assessment: Heparin for CP, heparin level sub-therapeutic despite rate increase, no issues per RN.   Goal of Therapy:  Heparin level 0.3-0.7 units/ml Monitor platelets by anticoagulation protocol: Yes   Plan:  -Inc heparin to 1100 units/hr -1200 HL  Abran DukeLedford, Samanth Mirkin 02/09/2016,4:17 AM

## 2016-02-09 NOTE — Progress Notes (Addendum)
Patient ID: Diamond Hughes, female   DOB: [birth year], 66 y.o.   MRN: [id number]   SUBJECTIVE: No further chest pain.  TnI up to 6.99.   ECG: NSR, inferior and anterolateral nonspecific changes  Scheduled Meds: . aspirin EC  81 mg Oral Daily  . atorvastatin  80 mg Oral q1800  . lisinopril  5 mg Oral Daily  . metoprolol tartrate  25 mg Oral BID   Continuous Infusions: . heparin 1,100 Units/hr (02/09/16 0700)  . nitroGLYCERIN Stopped (02/09/16 0530)   PRN Meds:.acetaminophen, nitroGLYCERIN, nitroGLYCERIN, ondansetron (ZOFRAN) IV    Vitals:   02/09/16 0528 02/09/16 0530 02/09/16 0621 02/09/16 0700  BP:   100/64 103/70  Pulse: 75 74 83 75  Resp: 13 13 16 15   Temp:    97.9 F (36.6 C)  TempSrc:    Oral  SpO2: 99% 99% 100% 100%  Weight:      Height:        Intake/Output Summary (Last 24 hours) at 02/09/16 0908 Last data filed at 02/09/16 0600  Gross per 24 hour  Intake           482.41 ml  Output              475 ml  Net             7.41 ml    LABS: Basic Metabolic Panel:  Recent Labs  40/98/1100/03/22 1101 02/09/16 0221  NA 133* 131*  K 4.3 3.7  CL 97* 94*  CO2 25 26  GLUCOSE 120* 134*  BUN 8 11  CREATININE 0.81 0.78  CALCIUM 9.5 9.2   Liver Function Tests: No results for input(s): AST, ALT, ALKPHOS, BILITOT, PROT, ALBUMIN in the last 72 hours. No results for input(s): LIPASE, AMYLASE in the last 72 hours. CBC:  Recent Labs  02/08/16 1101 02/09/16 0221  WBC 7.6 8.1  HGB 11.6* 10.2*  HCT 35.1* 30.9*  MCV 96.7 96.3  PLT 446* 357   Cardiac Enzymes:  Recent Labs  02/08/16 1212 02/09/16 0221  TROPONINI 2.89* 6.99*   BNP: Invalid input(s): POCBNP D-Dimer: No results for input(s): DDIMER in the last 72 hours. Hemoglobin A1C: No results for input(s): HGBA1C in the last 72 hours. Fasting Lipid Panel:  Recent Labs  02/09/16 0221  CHOL 210*  HDL 81  LDLCALC 110*  TRIG 95  CHOLHDL 2.6   Thyroid Function Tests: No results for input(s): TSH,  T4TOTAL, T3FREE, THYROIDAB in the last 72 hours.  Invalid input(s): FREET3 Anemia Panel: No results for input(s): VITAMINB12, FOLATE, FERRITIN, TIBC, IRON, RETICCTPCT in the last 72 hours.  RADIOLOGY: Dg Chest 2 View  Result Date: 02/02/2016 CLINICAL DATA:  Shortness of breath with cough and congestion 2 days. EXAM: CHEST  2 VIEW COMPARISON:  12/18/2003 FINDINGS: Lungs are adequately inflated without focal airspace consolidation or effusion. Borderline stable cardiomegaly. Mild degenerate change of the spine. IMPRESSION: No active cardiopulmonary disease. Electronically Signed   By: Elberta Fortisaniel  Boyle M.D.   On: 02/02/2016 22:28    PHYSICAL EXAM General: NAD Neck: No JVD, no thyromegaly or thyroid nodule.  Lungs: Clear to auscultation bilaterally with normal respiratory effort. CV: Nondisplaced PMI.  Heart regular S1/S2, no S3/S4, no murmur.  No peripheral edema.  Abdomen: Soft, nontender, no hepatosplenomegaly, no distention.  Neurologic: Alert and oriented x 3.  Psych: Normal affect. Extremities: No clubbing or cyanosis.   TELEMETRY: Reviewed telemetry pt in NSR  ASSESSMENT AND PLAN: 66 yo with history of CAD s/p  PCI in 2000 as well as HTN and smoking presented with NSTEMI.  1. NSTEMI: Troponin to 6.99.  Nonspecific inferior and anterolateral changes on ECG.  No further chest pain.  Prior PCI in 2000 in [place], no details.  - Continue heparin gtt, ASA, statin.  - Continue metoprolol.  - Off NTG gtt with fall in BP.  - Cath tomorrow.  Risks/benefits discussed with patient, she agrees to proceed.  2. HTN: BP lower today.  Will hold HCTZ and decrease lisinopril dose.  3. Smoking: She says she quit shortly before this event.   Marca Ancona 02/09/2016 9:15 AM

## 2016-02-10 ENCOUNTER — Encounter (HOSPITAL_COMMUNITY)
Admission: EM | Disposition: A | Discharge status: Home / Self Care | Payer: Self-pay | Source: Home / Self Care | Attending: Cardiology

## 2016-02-10 ENCOUNTER — Encounter (HOSPITAL_COMMUNITY): Payer: Self-pay | Admitting: Interventional Cardiology

## 2016-02-10 DIAGNOSIS — I2510 Atherosclerotic heart disease of native coronary artery without angina pectoris: Secondary | ICD-10-CM

## 2016-02-10 HISTORY — PX: LEFT HEART CATH AND CORONARY ANGIOGRAPHY: CATH118249

## 2016-02-10 HISTORY — PX: CORONARY STENT INTERVENTION: CATH118234

## 2016-02-10 LAB — CBC
HCT: 30.6 % — ABNORMAL LOW (ref 36.0–46.0)
HEMATOCRIT: 34.5 % — AB (ref 36.0–46.0)
HEMOGLOBIN: 10.2 g/dL — AB (ref 12.0–15.0)
Hemoglobin: 11.3 g/dL — ABNORMAL LOW (ref 12.0–15.0)
MCH: 31.9 pg (ref 26.0–34.0)
MCH: 32.1 pg (ref 26.0–34.0)
MCHC: 33.3 g/dL (ref 30.0–36.0)
MCHC: 32.8 g/dL (ref 30.0–36.0)
MCV: 95.6 fL (ref 78.0–100.0)
MCV: 98 fL (ref 78.0–100.0)
PLATELETS: 398 10*3/uL (ref 150–400)
Platelets: 400 10*3/uL (ref 150–400)
RBC: 3.2 MIL/uL — ABNORMAL LOW (ref 3.87–5.11)
RBC: 3.52 MIL/uL — ABNORMAL LOW (ref 3.87–5.11)
RDW: 12.8 % (ref 11.5–15.5)
RDW: 13 % (ref 11.5–15.5)
WBC: 7.8 10*3/uL (ref 4.0–10.5)
WBC: 8.7 10*3/uL (ref 4.0–10.5)

## 2016-02-10 LAB — URINE CULTURE

## 2016-02-10 LAB — HEMOGLOBIN A1C
HEMOGLOBIN A1C: 5.7 % — AB (ref 4.8–5.6)
Mean Plasma Glucose: 117 mg/dL

## 2016-02-10 LAB — PROTIME-INR
INR: 1
PROTHROMBIN TIME: 13.2 s (ref 11.4–15.2)

## 2016-02-10 LAB — POCT ACTIVATED CLOTTING TIME
ACTIVATED CLOTTING TIME: 313 s
Activated Clotting Time: 346 seconds
Activated Clotting Time: 423 seconds

## 2016-02-10 LAB — BASIC METABOLIC PANEL
ANION GAP: 8 (ref 5–15)
BUN: 12 mg/dL (ref 6–20)
CALCIUM: 9.1 mg/dL (ref 8.9–10.3)
CO2: 25 mmol/L (ref 22–32)
CREATININE: 0.77 mg/dL (ref 0.44–1.00)
Chloride: 98 mmol/L — ABNORMAL LOW (ref 101–111)
GFR calc Af Amer: >60 mL/min (ref >60)
GFR calc non Af Amer: >60 mL/min (ref >60)
Glucose, Bld: 125 mg/dL — ABNORMAL HIGH (ref 65–99)
Potassium: 3.8 mmol/L (ref 3.5–5.1)
SODIUM: 131 mmol/L — AB (ref 135–145)

## 2016-02-10 LAB — CREATININE, SERUM
Creatinine, Ser: 0.79 mg/dL (ref 0.44–1.00)
GFR calc Af Amer: >60 mL/min (ref >60)
GFR calc non Af Amer: >60 mL/min (ref >60)

## 2016-02-10 LAB — HEPARIN LEVEL (UNFRACTIONATED): HEPARIN UNFRACTIONATED: 0.55 [IU]/mL (ref 0.30–0.70)

## 2016-02-10 SURGERY — LEFT HEART CATH AND CORONARY ANGIOGRAPHY
Anesthesia: LOCAL

## 2016-02-10 MED ORDER — LIDOCAINE HCL (PF) 1 % IJ SOLN
INTRAMUSCULAR | Status: AC
  Filled 2016-02-10: qty 30

## 2016-02-10 MED ORDER — ANGIOPLASTY BOOK
Freq: Once | Status: AC
  Administered 2016-02-10: 23:00:00
  Filled 2016-02-10: qty 1

## 2016-02-10 MED ORDER — CLOPIDOGREL BISULFATE 75 MG PO TABS
75.0000 mg | ORAL_TABLET | Freq: Every day | ORAL | Status: DC
  Administered 2016-02-11 – 2016-02-12 (×2): 75 mg via ORAL
  Filled 2016-02-10 (×2): qty 1

## 2016-02-10 MED ORDER — SODIUM CHLORIDE 0.9% FLUSH
3.0000 mL | Freq: Two times a day (BID) | INTRAVENOUS | Status: DC
  Administered 2016-02-10 – 2016-02-11 (×3): 3 mL via INTRAVENOUS

## 2016-02-10 MED ORDER — HEPARIN SODIUM (PORCINE) 1000 UNIT/ML IJ SOLN
INTRAMUSCULAR | Status: DC | PRN
  Administered 2016-02-10: 3500 [IU] via INTRAVENOUS
  Administered 2016-02-10: 2000 [IU] via INTRAVENOUS
  Administered 2016-02-10: 6500 [IU] via INTRAVENOUS

## 2016-02-10 MED ORDER — HYDRALAZINE HCL 20 MG/ML IJ SOLN: 5.0000 mg | INTRAMUSCULAR | Status: DC | PRN

## 2016-02-10 MED ORDER — ASPIRIN 81 MG PO CHEW: 81.0000 mg | CHEWABLE_TABLET | Freq: Every day | ORAL | Status: DC

## 2016-02-10 MED ORDER — HEPARIN SODIUM (PORCINE) 1000 UNIT/ML IJ SOLN
INTRAMUSCULAR | Status: AC
  Filled 2016-02-10: qty 1

## 2016-02-10 MED ORDER — MIDAZOLAM HCL 2 MG/2ML IJ SOLN
INTRAMUSCULAR | Status: AC
  Filled 2016-02-10: qty 2

## 2016-02-10 MED ORDER — ONDANSETRON HCL 4 MG/2ML IJ SOLN: 4.0000 mg | Freq: Four times a day (QID) | INTRAMUSCULAR | Status: DC | PRN

## 2016-02-10 MED ORDER — HEPARIN (PORCINE) IN NACL 2-0.9 UNIT/ML-% IJ SOLN
INTRAMUSCULAR | Status: DC | PRN
  Administered 2016-02-10: 10 mL via INTRA_ARTERIAL

## 2016-02-10 MED ORDER — HEPARIN (PORCINE) IN NACL 2-0.9 UNIT/ML-% IJ SOLN
INTRAMUSCULAR | Status: AC
  Filled 2016-02-10: qty 500

## 2016-02-10 MED ORDER — ATORVASTATIN CALCIUM 80 MG PO TABS: 80.0000 mg | ORAL_TABLET | Freq: Every day | ORAL | Status: DC

## 2016-02-10 MED ORDER — HEPARIN (PORCINE) IN NACL 2-0.9 UNIT/ML-% IJ SOLN
INTRAMUSCULAR | Status: DC | PRN
  Administered 2016-02-10: 12:00:00

## 2016-02-10 MED ORDER — VERAPAMIL HCL 2.5 MG/ML IV SOLN
INTRAVENOUS | Status: AC
  Filled 2016-02-10: qty 2

## 2016-02-10 MED ORDER — NITROGLYCERIN 1 MG/10 ML FOR IR/CATH LAB
INTRA_ARTERIAL | Status: DC | PRN
  Administered 2016-02-10: 100 ug via INTRACORONARY

## 2016-02-10 MED ORDER — SODIUM CHLORIDE 0.9% FLUSH: 3.0000 mL | INTRAVENOUS | Status: DC | PRN

## 2016-02-10 MED ORDER — NITROGLYCERIN 1 MG/10 ML FOR IR/CATH LAB
INTRA_ARTERIAL | Status: AC
  Filled 2016-02-10: qty 10

## 2016-02-10 MED ORDER — HEPARIN (PORCINE) IN NACL 2-0.9 UNIT/ML-% IJ SOLN
INTRAMUSCULAR | Status: DC | PRN
  Administered 2016-02-10: 1000 mL

## 2016-02-10 MED ORDER — TIROFIBAN HCL IN NACL 5-0.9 MG/100ML-% IV SOLN: INTRAVENOUS | Status: DC | PRN

## 2016-02-10 MED ORDER — HEPARIN SODIUM (PORCINE) 5000 UNIT/ML IJ SOLN
5000.0000 [IU] | Freq: Three times a day (TID) | INTRAMUSCULAR | Status: DC
  Administered 2016-02-11: 5000 [IU] via SUBCUTANEOUS
  Filled 2016-02-10 (×2): qty 1

## 2016-02-10 MED ORDER — IOPAMIDOL (ISOVUE-370) INJECTION 76%
INTRAVENOUS | Status: DC | PRN
  Administered 2016-02-10: 240 mL

## 2016-02-10 MED ORDER — OXYCODONE-ACETAMINOPHEN 5-325 MG PO TABS
1.0000 | ORAL_TABLET | ORAL | Status: DC | PRN
  Administered 2016-02-10: 1 via ORAL
  Filled 2016-02-10: qty 1

## 2016-02-10 MED ORDER — IOPAMIDOL (ISOVUE-370) INJECTION 76%
INTRAVENOUS | Status: AC
  Filled 2016-02-10: qty 100

## 2016-02-10 MED ORDER — ACETAMINOPHEN 325 MG PO TABS: 650.0000 mg | ORAL_TABLET | ORAL | Status: DC | PRN

## 2016-02-10 MED ORDER — IOPAMIDOL (ISOVUE-370) INJECTION 76%
INTRAVENOUS | Status: AC
  Filled 2016-02-10: qty 50

## 2016-02-10 MED ORDER — MIDAZOLAM HCL 2 MG/2ML IJ SOLN
INTRAMUSCULAR | Status: DC | PRN
  Administered 2016-02-10 (×2): 1 mg via INTRAVENOUS

## 2016-02-10 MED ORDER — FENTANYL CITRATE (PF) 100 MCG/2ML IJ SOLN
INTRAMUSCULAR | Status: AC
  Filled 2016-02-10: qty 2

## 2016-02-10 MED ORDER — CLOPIDOGREL BISULFATE 300 MG PO TABS
ORAL_TABLET | ORAL | Status: AC
  Filled 2016-02-10: qty 2

## 2016-02-10 MED ORDER — LABETALOL HCL 5 MG/ML IV SOLN: 10.0000 mg | INTRAVENOUS | Status: DC | PRN

## 2016-02-10 MED ORDER — TIROFIBAN (AGGRASTAT) BOLUS VIA INFUSION
INTRAVENOUS | Status: DC | PRN
  Administered 2016-02-10: 1722.5 ug via INTRAVENOUS

## 2016-02-10 MED ORDER — TIROFIBAN HCL IN NACL 5-0.9 MG/100ML-% IV SOLN
INTRAVENOUS | Status: AC
  Filled 2016-02-10: qty 100

## 2016-02-10 MED ORDER — SODIUM CHLORIDE 0.9 % IV SOLN
INTRAVENOUS | Status: DC
  Administered 2016-02-10: 14:00:00 via INTRAVENOUS

## 2016-02-10 MED ORDER — HEPARIN (PORCINE) IN NACL 2-0.9 UNIT/ML-% IJ SOLN
INTRAMUSCULAR | Status: AC
  Filled 2016-02-10: qty 1000

## 2016-02-10 MED ORDER — CLOPIDOGREL BISULFATE 300 MG PO TABS
ORAL_TABLET | ORAL | Status: DC | PRN
  Administered 2016-02-10: 600 mg via ORAL

## 2016-02-10 MED ORDER — FENTANYL CITRATE (PF) 100 MCG/2ML IJ SOLN
INTRAMUSCULAR | Status: DC | PRN
  Administered 2016-02-10 (×2): 50 ug via INTRAVENOUS

## 2016-02-10 MED ORDER — SODIUM CHLORIDE 0.9 % IV SOLN: 250.0000 mL | INTRAVENOUS | Status: DC | PRN

## 2016-02-10 SURGICAL SUPPLY — 21 items
BALLN MOZEC 1.5X15 (BALLOONS) ×2
BALLN MOZEC 2.0X15 (BALLOONS) ×2
BALLOON MOZEC 1.5X15 (BALLOONS) ×1 IMPLANT
BALLOON MOZEC 2.0X15 (BALLOONS) ×1 IMPLANT
CATH INFINITI 5 FR JL3.5 (CATHETERS) ×2 IMPLANT
CATH INFINITI JR4 5F (CATHETERS) ×2 IMPLANT
COVER PRB 48X5XTLSCP FOLD TPE (BAG) ×1 IMPLANT
COVER PROBE 5X48 (BAG) ×1
DEVICE RAD COMP TR BAND LRG (VASCULAR PRODUCTS) ×2 IMPLANT
GLIDESHEATH SLEND A-KIT 6F 22G (SHEATH) ×2 IMPLANT
GUIDE CATH RUNWAY 6FR CLS3 (CATHETERS) ×2 IMPLANT
GUIDE CATH RUNWAY 6FR FR4 (CATHETERS) ×2 IMPLANT
GUIDEWIRE INQWIRE 1.5J.035X260 (WIRE) ×1 IMPLANT
INQWIRE 1.5J .035X260CM (WIRE) ×2
KIT ENCORE 26 ADVANTAGE (KITS) ×2 IMPLANT
KIT HEART LEFT (KITS) ×2 IMPLANT
PACK CARDIAC CATHETERIZATION (CUSTOM PROCEDURE TRAY) ×2 IMPLANT
STENT RESOLUTE ONYX 3.5X18 (Permanent Stent) ×2 IMPLANT
TRANSDUCER W/STOPCOCK (MISCELLANEOUS) ×2 IMPLANT
TUBING CIL FLEX 10 FLL-RA (TUBING) ×2 IMPLANT
WIRE ASAHI PROWATER 180CM (WIRE) ×2 IMPLANT

## 2016-02-10 NOTE — Progress Notes (Signed)
Progress Note  Patient Name: Diamond Hughes Date of Encounter: 02/10/2016  Primary Cardiologist: Shirlee Latch  Subjective   One episode of chest pain last night. No chest pain this am.   Inpatient Medications    Scheduled Meds: . aspirin EC  81 mg Oral Daily  . atorvastatin  80 mg Oral q1800  . lisinopril  5 mg Oral Daily  . metoprolol tartrate  25 mg Oral BID  . sodium chloride flush  3 mL Intravenous Q12H   Continuous Infusions: . sodium chloride    . heparin 1,100 Units/hr (02/09/16 1900)  . nitroGLYCERIN Stopped (02/09/16 0530)   PRN Meds: sodium chloride, acetaminophen, nitroGLYCERIN, ondansetron (ZOFRAN) IV, sodium chloride flush   Vital Signs    Vitals:   02/10/16 0415 02/10/16 0425 02/10/16 0430 02/10/16 0820  BP:  106/70 96/71 104/72  Pulse: 73 73 71 79  Resp: 18 13 16 12   Temp:    98.1 F (36.7 C)  TempSrc:    Oral  SpO2: 100% 100% 100% 100%  Weight:      Height:        Intake/Output Summary (Last 24 hours) at 02/10/16 0855 Last data filed at 02/10/16 0700  Gross per 24 hour  Intake              515 ml  Output             1000 ml  Net             -485 ml   Filed Weights   02/08/16 1031  Weight: 152 lb (68.9 kg)    Telemetry    Sinus - Personally Reviewed  Physical Exam   GEN: No acute distress.   Neck: No JVD Cardiac: RRR, no murmurs, rubs, or gallops.  Respiratory: Clear to auscultation bilaterally. GI: Soft, nontender, non-distended  MS: No edema; No deformity. Neuro:  Nonfocal  Psych: Normal affect   Labs    Chemistry Recent Labs Lab 02/08/16 1101 02/09/16 0221 02/10/16 0352  NA 133* 131* 131*  K 4.3 3.7 3.8  CL 97* 94* 98*  CO2 25 26 25   GLUCOSE 120* 134* 125*  BUN 8 11 12   CREATININE 0.81 0.78 0.77  CALCIUM 9.5 9.2 9.1  GFRNONAA >60 >60 >60  GFRAA >60 >60 >60  ANIONGAP 11 11 8      Hematology Recent Labs Lab 02/08/16 1101 02/09/16 0221 02/10/16 0352  WBC 7.6 8.1 7.8  RBC 3.63* 3.21* 3.20*  HGB 11.6* 10.2*  10.2*  HCT 35.1* 30.9* 30.6*  MCV 96.7 96.3 95.6  MCH 32.0 31.8 31.9  MCHC 33.0 33.0 33.3  RDW 13.0 12.9 12.8  PLT 446* 357 400    Cardiac Enzymes Recent Labs Lab 02/08/16 1212 02/09/16 0221  TROPONINI 2.89* 6.99*    Recent Labs Lab 02/08/16 1058  TROPIPOC 2.55*     BNPNo results for input(s): BNP, PROBNP in the last 168 hours.   DDimer No results for input(s): DDIMER in the last 168 hours.   Radiology    No results found.  Cardiac Studies    Patient Profile     66 y.o. female with history of CAD, HTN, tobacco abuse admitted with chest pain/NSTEMI  Assessment & Plan    1. CAD/NSTEMI: Pt with h/o CAD. Admitted with chest pain. Troponin up to 6.99. She is on ASA, statin, beta blocker, Ace-inh, heparin drip. Plans for cardiac cath today.   2. HTN: BP stable  3. Tobacco abuse: smoking cessation recommended.  Signed, Verne Carrowhristopher McAlhany, MD  02/10/2016, 8:55 AM

## 2016-02-10 NOTE — Progress Notes (Signed)
Belongings endorsed to the pt by NT.

## 2016-02-10 NOTE — Progress Notes (Signed)
Patient called RN into room, reported 4/10, pressure like, mid chest pain radiating down left arm. Patient given 0.4 mg SL nitro X2 over 10 minutes. Reported 1/10 dull pain to mid chest after 2nd dose. Patient is resting, advised to call RN if pain reoccurred. Will continue to monitor.

## 2016-02-10 NOTE — Care Management Note (Signed)
Case Management Note  Patient Details  Name: Diamond Hughes MRN: [id number] Date of Birth: 05/27/1950  Subjective/Objective:    S/p coronary stent, will be on plavix/asa, NCM will cont to follow for dc needs.                Action/Plan:   Expected Discharge Date:                  Expected Discharge Plan:  Home/Self Care  In-House Referral:     Discharge planning Services  CM Consult  Post Acute Care Choice:    Choice offered to:     DME Arranged:    DME Agency:     HH Arranged:    HH Agency:     Status of Service:  In process, will continue to follow  If discussed at Long Length of Stay Meetings, dates discussed:    Additional Comments:  Leone Havenaylor, Lidia Clavijo Clinton, RN 02/10/2016, 4:25 PM

## 2016-02-10 NOTE — Progress Notes (Signed)
ANTICOAGULATION CONSULT NOTE - Follow Up Consult  Pharmacy Consult for Heparin  Indication: chest pain/ACS  No Known Allergies  Patient Measurements: Height: 5\' 3"  (160 cm) Weight: 152 lb (68.9 kg) IBW/kg (Calculated) : 52.4   Vital Signs: Temp: 98.1 F (36.7 C) (02/05 0820) Temp Source: Oral (02/05 0820) BP: 104/72 (02/05 0820) Pulse Rate: 79 (02/05 0820)  Labs:  Recent Labs  02/08/16 1101 02/08/16 1212  02/09/16 0221 02/09/16 1104 02/09/16 1805 02/10/16 0352  HGB 11.6*  --   --  10.2*  --   --  10.2*  HCT 35.1*  --   --  30.9*  --   --  30.6*  PLT 446*  --   --  357  --   --  400  HEPARINUNFRC  --   --   < > 0.21* 0.40 0.40 0.55  CREATININE 0.81  --   --  0.78  --   --  0.77  TROPONINI  --  2.89*  --  6.99*  --   --   --   < > = values in this interval not displayed.  Estimated Creatinine Clearance: 67 mL/min (by C-G formula based on SCr of 0.77 mg/dL).  . sodium chloride 1 mL/kg/hr (02/10/16 0900)  . heparin 1,100 Units/hr (02/09/16 1900)  . nitroGLYCERIN Stopped (02/09/16 0530)    Assessment: 66 yo female w/ hx MI on IV Heparin for NSTEMI.  Heparin level confirmed at goal. Awaiting cath this afternoon.  No bleeding or complications noted.  Goal of Therapy:  Heparin level 0.3-0.7 units/ml Monitor platelets by anticoagulation protocol: Yes   Plan:  Continue IV heparin at current rate. Daily heparin level and CBC. F/u plans for heparin after cath lab.  Tad MooreJessica Yvette Loveless, Pharm D, BCPS  Clinical Pharmacist Pager [phone]  02/10/2016 9:17 AM

## 2016-02-10 NOTE — H&P (View-Only) (Signed)
Progress Note  Patient Name: Diamond Hughes Date of Encounter: 02/10/2016  Primary Cardiologist: Shirlee Latch  Subjective   One episode of chest pain last night. No chest pain this am.   Inpatient Medications    Scheduled Meds: . aspirin EC  81 mg Oral Daily  . atorvastatin  80 mg Oral q1800  . lisinopril  5 mg Oral Daily  . metoprolol tartrate  25 mg Oral BID  . sodium chloride flush  3 mL Intravenous Q12H   Continuous Infusions: . sodium chloride    . heparin 1,100 Units/hr (02/09/16 1900)  . nitroGLYCERIN Stopped (02/09/16 0530)   PRN Meds: sodium chloride, acetaminophen, nitroGLYCERIN, ondansetron (ZOFRAN) IV, sodium chloride flush   Vital Signs    Vitals:   02/10/16 0415 02/10/16 0425 02/10/16 0430 02/10/16 0820  BP:  106/70 96/71 104/72  Pulse: 73 73 71 79  Resp: 18 13 16 12   Temp:    98.1 F (36.7 C)  TempSrc:    Oral  SpO2: 100% 100% 100% 100%  Weight:      Height:        Intake/Output Summary (Last 24 hours) at 02/10/16 0855 Last data filed at 02/10/16 0700  Gross per 24 hour  Intake              515 ml  Output             1000 ml  Net             -485 ml   Filed Weights   02/08/16 1031  Weight: 152 lb (68.9 kg)    Telemetry    Sinus - Personally Reviewed  Physical Exam   GEN: No acute distress.   Neck: No JVD Cardiac: RRR, no murmurs, rubs, or gallops.  Respiratory: Clear to auscultation bilaterally. GI: Soft, nontender, non-distended  MS: No edema; No deformity. Neuro:  Nonfocal  Psych: Normal affect   Labs    Chemistry Recent Labs Lab 02/08/16 1101 02/09/16 0221 02/10/16 0352  NA 133* 131* 131*  K 4.3 3.7 3.8  CL 97* 94* 98*  CO2 25 26 25   GLUCOSE 120* 134* 125*  BUN 8 11 12   CREATININE 0.81 0.78 0.77  CALCIUM 9.5 9.2 9.1  GFRNONAA >60 >60 >60  GFRAA >60 >60 >60  ANIONGAP 11 11 8      Hematology Recent Labs Lab 02/08/16 1101 02/09/16 0221 02/10/16 0352  WBC 7.6 8.1 7.8  RBC 3.63* 3.21* 3.20*  HGB 11.6* 10.2*  10.2*  HCT 35.1* 30.9* 30.6*  MCV 96.7 96.3 95.6  MCH 32.0 31.8 31.9  MCHC 33.0 33.0 33.3  RDW 13.0 12.9 12.8  PLT 446* 357 400    Cardiac Enzymes Recent Labs Lab 02/08/16 1212 02/09/16 0221  TROPONINI 2.89* 6.99*    Recent Labs Lab 02/08/16 1058  TROPIPOC 2.55*     BNPNo results for input(s): BNP, PROBNP in the last 168 hours.   DDimer No results for input(s): DDIMER in the last 168 hours.   Radiology    No results found.  Cardiac Studies    Patient Profile     66 y.o. female with history of CAD, HTN, tobacco abuse admitted with chest pain/NSTEMI  Assessment & Plan    1. CAD/NSTEMI: Pt with h/o CAD. Admitted with chest pain. Troponin up to 6.99. She is on ASA, statin, beta blocker, Ace-inh, heparin drip. Plans for cardiac cath today.   2. HTN: BP stable  3. Tobacco abuse: smoking cessation recommended.  Signed, Verne Carrowhristopher Tempie Gibeault, MD  02/10/2016, 8:55 AM

## 2016-02-10 NOTE — Interval H&P Note (Signed)
Cath Lab Visit (complete for each Cath Lab visit)  Clinical Evaluation Leading to the Procedure:   ACS: Yes.    Non-ACS:    Anginal Classification: CCS III  Anti-ischemic medical therapy: Maximal Therapy (2 or more classes of medications)  Non-Invasive Test Results: No non-invasive testing performed  Prior CABG: No previous CABG      History and Physical Interval Note:  02/10/2016 10:56 AM  Diamond Hughes  has presented today for surgery, with the diagnosis of NSTEMI  The various methods of treatment have been discussed with the patient and family. After consideration of risks, benefits and other options for treatment, the patient has consented to  Procedure(s): Left Heart Cath and Coronary Angiography (N/A) as a surgical intervention .  The patient's history has been reviewed, patient examined, no change in status, stable for surgery.  I have reviewed the patient's chart and labs.  Questions were answered to the patient's satisfaction.     Lyn RecordsHenry W Boyd Buffalo III

## 2016-02-10 NOTE — CV Procedure (Signed)
   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 The right coronary is heavily stented from the proximal to the distal vessel. 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 There is in-stent diffuse restenosis as well as high-grade obstruction distal to the stent likely from previous balloon related intimal injury. (Stents implanted 2000).  LAD and left main all widely patent.  Mid circumflex 99% obstructed with TIMI grade 1-2 flow. There are collaterals to the right coronary and the circumflex.  After angiography completed, the patient complained of severe increase in intensity of discomfort.  Unable to perform PCI on the disease within the right coronary stents and distally because of poor support and inability to cross with the balloon.  The mid circumflex was angioplastied and stented from 99% with reduced flow to 0% with TIMI grade 3 flow and improvement in symptoms.

## 2016-02-11 LAB — BASIC METABOLIC PANEL
ANION GAP: 8 (ref 5–15)
BUN: 12 mg/dL (ref 6–20)
CALCIUM: 9 mg/dL (ref 8.9–10.3)
CO2: 25 mmol/L (ref 22–32)
Chloride: 101 mmol/L (ref 101–111)
Creatinine, Ser: 0.84 mg/dL (ref 0.44–1.00)
GFR calc Af Amer: >60 mL/min (ref >60)
GLUCOSE: 112 mg/dL — AB (ref 65–99)
Potassium: 3.8 mmol/L (ref 3.5–5.1)
Sodium: 134 mmol/L — ABNORMAL LOW (ref 135–145)

## 2016-02-11 LAB — CBC
HCT: 29 % — ABNORMAL LOW (ref 36.0–46.0)
Hemoglobin: 9.3 g/dL — ABNORMAL LOW (ref 12.0–15.0)
MCH: 31.2 pg (ref 26.0–34.0)
MCHC: 32.1 g/dL (ref 30.0–36.0)
MCV: 97.3 fL (ref 78.0–100.0)
PLATELETS: 349 10*3/uL (ref 150–400)
RBC: 2.98 MIL/uL — ABNORMAL LOW (ref 3.87–5.11)
RDW: 13 % (ref 11.5–15.5)
WBC: 6.1 10*3/uL (ref 4.0–10.5)

## 2016-02-11 LAB — TROPONIN I: Troponin I: 8.22 ng/mL (ref <0.03)

## 2016-02-11 MED ORDER — HEART ATTACK BOUNCING BOOK
Freq: Once | Status: AC
  Administered 2016-02-11: 04:00:00
  Filled 2016-02-11: qty 1

## 2016-02-11 MED ORDER — MAGNESIUM HYDROXIDE 400 MG/5ML PO SUSP
30.0000 mL | Freq: Every day | ORAL | Status: DC | PRN
  Administered 2016-02-12: 30 mL via ORAL
  Filled 2016-02-11: qty 30

## 2016-02-11 MED FILL — Midazolam HCl Inj 2 MG/2ML (Base Equivalent): INTRAMUSCULAR | Qty: 2 | Status: AC

## 2016-02-11 MED FILL — Tirofiban HCl in NaCl 0.9% IV Soln 5 MG/100ML (Base Equiv): INTRAVENOUS | Qty: 100 | Status: AC

## 2016-02-11 MED FILL — Lidocaine HCl Local Preservative Free (PF) Inj 1%: INTRAMUSCULAR | Qty: 30 | Status: AC

## 2016-02-11 NOTE — Care Management Note (Signed)
Case Management Note  Patient Details  Name: Shelbie [name] Bogus MRN: [id number] Date of Birth: August 26, 1950  Subjective/Objective:    s/p coronary stent, will be on plavix/asa, NCM assisted patient with Match letter for medications, also [facility].  Patient has transportation at discharge.                                Action/Plan:   Expected Discharge Date:                  Expected Discharge Plan:  Home/Self Care  In-House Referral:     Discharge planning Services  CM Consult, Follow-up appt scheduled, MATCH Program, [facility], Medication Assistance  Post Acute Care Choice:    Choice offered to:     DME Arranged:    DME Agency:     HH Arranged:    HH Agency:     Status of Service:  Completed, signed off  If discussed at [employer] Meetings, dates discussed:    Additional Comments:  Leone Havenaylor, Azara Gemme Clinton, RN 02/11/2016, 10:34 AM

## 2016-02-11 NOTE — Progress Notes (Signed)
Progress Note  Patient Name: Diamond Hughes Date of Encounter: 02/11/2016  Primary Cardiologist: Aundra Dubin  Subjective   No chest pain or dyspnea this am  Inpatient Medications    Scheduled Meds: . aspirin EC  81 mg Oral Daily  . atorvastatin  80 mg Oral q1800  . clopidogrel  75 mg Oral Q breakfast  . heparin  5,000 Units Subcutaneous Q8H  . lisinopril  5 mg Oral Daily  . metoprolol tartrate  25 mg Oral BID  . sodium chloride flush  3 mL Intravenous Q12H   Continuous Infusions: . nitroGLYCERIN Stopped (02/09/16 0530)   PRN Meds: sodium chloride, acetaminophen, nitroGLYCERIN, ondansetron (ZOFRAN) IV, oxyCODONE-acetaminophen, sodium chloride flush   Vital Signs    Vitals:   02/10/16 1900 02/10/16 2006 02/11/16 0629 02/11/16 0741  BP: (!) 95/52 (!) 102/54 119/68 101/60  Pulse: 70 81 91 82  Resp: _0 Temp:  98.3 F (36.8 C) 98.6 F (37 C) 97.6 F (36.4 C)  TempSrc:  Oral Oral Oral  SpO2: 99% 94% 97% 97%  Weight:   152 lb 5.4 oz (69.1 kg)   Height:        Intake/Output Summary (Last 24 hours) at 02/11/16 0816 Last data filed at 02/11/16 0741  Gross per 24 hour  Intake             1522 ml  Output             1400 ml  Net              122 ml   Filed Weights   02/08/16 1031 02/11/16 0629  Weight: 152 lb (68.9 kg) 152 lb 5.4 oz (69.1 kg)    Telemetry    Sinus - Personally Reviewed  Physical Exam   GEN: No acute distress.   Neck: No JVD Cardiac: RRR, no murmurs, rubs, or gallops.  Respiratory: Clear to auscultation bilaterally. GI: Soft, nontender, non-distended  MS: No edema; No deformity. Neuro:  Nonfocal  Psych: Normal affect   Labs    Chemistry  Recent Labs Lab 02/09/16 0221 02/10/16 0352 02/10/16 1421 02/11/16 0202  NA 131* 131*  --  134*  K 3.7 3.8  --  3.8  CL 94* 98*  --  101  CO2 26 25  --  25  GLUCOSE 134* 125*  --  112*  BUN 11 12  --  12  CREATININE 0.78 0.77 0.79 0.84  CALCIUM 9.2 9.1  --  9.0  GFRNONAA >60 >60 >60  >60  GFRAA >60 >60 >60 >60  ANIONGAP 11 8  --  8     Hematology  Recent Labs Lab 02/10/16 0352 02/10/16 1421 02/11/16 0202  WBC 7.8 8.7 6.1  RBC 3.20* 3.52* 2.98*  HGB 10.2* 11.3* 9.3*  HCT 30.6* 34.5* 29.0*  MCV 95.6 98.0 97.3  MCH 31.9 32.1 31.2  MCHC 33.3 32.8 32.1  RDW 12.8 13.0 13.0  PLT 400 398 349    Cardiac Enzymes  Recent Labs Lab 02/08/16 1212 02/09/16 0221 02/11/16 0202  TROPONINI 2.89* 6.99* 8.22*     Recent Labs Lab 02/08/16 1058  TROPIPOC 2.55*     BNPNo results for input(s): BNP, PROBNP in the last 168 hours.   DDimer No results for input(s): DDIMER in the last 168 hours.   Radiology    No results found.  Cardiac Studies   Echo 02/09/16: Left ventricle: The cavity size was normal. Wall thickness was  normal. Systolic function was mildly reduced. The estimated   ejection fraction was in the range of 45% to 50%. Basal to mid   inferior/posterior wall hypokinesis. Doppler parameters are   consistent with abnormal left ventricular relaxation (grade 1   diastolic dysfunction). The E/e&' ratio is between 8-15,   suggesting indeterminate LV filling pressure. - Mitral valve: Mildly thickened leaflets . There was trivial   regurgitation. - Left atrium: The atrium was normal in size. - Right ventricle: The cavity size was normal. Systolic function is   reduced. - Right atrium: The atrium was normal in size. - Inferior vena cava: The vessel was normal in size. The   respirophasic diameter changes were in the normal range (= 50%),   consistent with normal central venous pressure.  Impressions:  - LVEF 45-50%, normal wall thickness, basal to mid   inferior/posterior hypokinesis, grade 1 DD, indeterminate LV   filling pressure, normal LA size, normal IVC.  Cardiac cath 02/10/16: Coronary Findings   Dominance: Co-dominant  Left Circumflex  Prox Cx lesion, 100% stenosed. The lesion is type B2 and thrombotic.  Angioplasty: Lesion crossed  with guidewire using a WIRE ASAHI PROWATER 180CM. Pre-stent angioplasty was performed using a BALLOON MOZEC 2.0X15. Maximum pressure: 8 atm. A STENT RESOLUTE ONYX 3.5X18 drug eluting stent was successfully placed, and does not overlap previously placed stent. Stent strut is well apposed. The pre-interventional distal flow is decreased (TIMI 1). The post-interventional distal flow is normal (TIMI 3). The intervention was successful . No complications occurred at this lesion. Pressure wire/FFR was not performed on the lesion . IVUS was not performed on the lesion .  There is no residual stenosis post intervention.  First Obtuse Marginal Branch  Vessel is small in size.  Third Obtuse Marginal Branch  Vessel is small in size.  Right Coronary Artery  Prox RCA lesion, 70% stenosed. Aneurysmal segment noted.  Mid RCA to Dist RCA lesion, 95% stenosed. The lesion was previously treated.  Angioplasty: Lesion crossed with guidewire using a WIRE ASAHI PROWATER 180CM. Pre-stent angioplasty was not performed. A stent was not successfully placed. Post-stent angioplasty was not performed. The pre-interventional distal flow is normal (TIMI 3). The post-interventional distal flow is normal (TIMI 3). The intervention was unsuccessful due to inability to cross the lesion with balloon. No complications occurred at this lesion.  There is a 95% residual stenosis post intervention.  Dist RCA lesion, 95% stenosed.  Wall Motion              Left Heart   Left Ventricle The left ventricular size is normal. There is mild left ventricular systolic dysfunction. The left ventricular ejection fraction is 45-50% by visual estimate. There are LV function abnormalities due to segmental dysfunction.    Coronary Diagrams   Diagnostic Diagram     Post-Intervention Diagram         Patient Profile     66 y.o. female with history of CAD, HTN, tobacco abuse admitted with chest pain/NSTEMI  Assessment & Plan    1.  CAD/NSTEMI: Pt with h/o CAD. Admitted with chest pain. Troponin elevated. Cardiac cath 02/10/16 with severe stenosis in the Circumflex treated with a DES x 1. Unable to cross the severe stenosis in the aneurysmal RCA. Plans for medical management only of RCA lesion. Will continue ASA, Plavix, statin, beta blocker, Ace-inh. Will ambulate today. Given anemia, will hold on d/c home until tomorrow.   2. HTN: BP stable  3. Tobacco abuse: smoking cessation  recommended.   4. Anemia, post procedure: Will monitor today. Repeat H/H in the am.   5. Ischemic cardiomyopathy: Continue beta blocker and Ace-inh.   Will discharge 02/12/16. Follow H/H. Ambulate and make sure she has no chest pain.   Signed, Lauree Chandler, MD  02/11/2016, 8:16 AM

## 2016-02-11 NOTE — Progress Notes (Signed)
CARDIAC REHAB PHASE I   PRE:  Rate/Rhythm: 86 SR  BP:  Sitting: 101/60        SaO2: 99 RA  MODE:  Ambulation: 500 ft   POST:  Rate/Rhythm: 110 sT  BP:  Sitting: 124/67         SaO2: 98 RA  Pt ambulated 500 ft on RA, handheld assist, steady gait, tolerated well.  Pt c/o mild DOE, denies cp, dizziness, declined rest stop. Completed MI/stent education.  Reviewed risk factors, tobacco cessation (gave pt fake cigarette), MI book, anti-platelet therapy, stent card, activity restrictions, ntg, exercise, heart healthy diet, and phase 2 cardiac rehab. Pt verbalized understanding, receptive to education. Pt agrees to phase 2 cardiac rehab referral, [facility]. Pt does not have insurance, CM following for medication needs/to establish PCP. Pt to recliner after walk, call bell within reach. Will follow-up tomorrow if pt does not discharge today.    [phone] Joylene GrapesEmily C Willodean Leven, RN, BSN 02/11/2016 8:54 AM

## 2016-02-11 NOTE — Progress Notes (Signed)
TR BAND REMOVAL  LOCATION:    right radial  DEFLATED PER PROTOCOL:    Yes.    TIME BAND OFF / DRESSING APPLIED:    2030    SITE UPON ARRIVAL:    Level 0  SITE AFTER BAND REMOVAL:    Level 0  CIRCULATION SENSATION AND MOVEMENT:    Within Normal Limits   Yes.    COMMENTS:   Radial site teaching done. Teach back verified.

## 2016-02-12 ENCOUNTER — Telehealth: Payer: Self-pay | Admitting: *Deleted

## 2016-02-12 ENCOUNTER — Encounter (HOSPITAL_COMMUNITY): Payer: Self-pay | Admitting: Nurse Practitioner

## 2016-02-12 DIAGNOSIS — E785 Hyperlipidemia, unspecified: Secondary | ICD-10-CM

## 2016-02-12 DIAGNOSIS — I2510 Atherosclerotic heart disease of native coronary artery without angina pectoris: Secondary | ICD-10-CM

## 2016-02-12 DIAGNOSIS — I255 Ischemic cardiomyopathy: Secondary | ICD-10-CM

## 2016-02-12 DIAGNOSIS — I10 Essential (primary) hypertension: Secondary | ICD-10-CM

## 2016-02-12 DIAGNOSIS — D649 Anemia, unspecified: Secondary | ICD-10-CM

## 2016-02-12 DIAGNOSIS — E1169 Type 2 diabetes mellitus with other specified complication: Secondary | ICD-10-CM

## 2016-02-12 DIAGNOSIS — D500 Iron deficiency anemia secondary to blood loss (chronic): Secondary | ICD-10-CM

## 2016-02-12 LAB — CBC
HCT: 27.8 % — ABNORMAL LOW (ref 36.0–46.0)
HEMATOCRIT: 27.6 % — AB (ref 36.0–46.0)
HEMOGLOBIN: 8.8 g/dL — AB (ref 12.0–15.0)
Hemoglobin: 9 g/dL — ABNORMAL LOW (ref 12.0–15.0)
MCH: 30.7 pg (ref 26.0–34.0)
MCH: 31.8 pg (ref 26.0–34.0)
MCHC: 31.7 g/dL (ref 30.0–36.0)
MCHC: 32.6 g/dL (ref 30.0–36.0)
MCV: 96.9 fL (ref 78.0–100.0)
MCV: 97.5 fL (ref 78.0–100.0)
PLATELETS: 358 10*3/uL (ref 150–400)
Platelets: 366 10*3/uL (ref 150–400)
RBC: 2.83 MIL/uL — ABNORMAL LOW (ref 3.87–5.11)
RBC: 2.87 MIL/uL — AB (ref 3.87–5.11)
RDW: 12.7 % (ref 11.5–15.5)
RDW: 13 % (ref 11.5–15.5)
WBC: 6.7 10*3/uL (ref 4.0–10.5)
WBC: 6.8 10*3/uL (ref 4.0–10.5)

## 2016-02-12 LAB — BASIC METABOLIC PANEL
ANION GAP: 10 (ref 5–15)
BUN: 9 mg/dL (ref 6–20)
CHLORIDE: 98 mmol/L — AB (ref 101–111)
CO2: 27 mmol/L (ref 22–32)
Calcium: 9.3 mg/dL (ref 8.9–10.3)
Creatinine, Ser: 0.77 mg/dL (ref 0.44–1.00)
GFR calc non Af Amer: >60 mL/min (ref >60)
Glucose, Bld: 100 mg/dL — ABNORMAL HIGH (ref 65–99)
POTASSIUM: 3.9 mmol/L (ref 3.5–5.1)
Sodium: 135 mmol/L (ref 135–145)

## 2016-02-12 MED ORDER — ASPIRIN 81 MG PO TABS: 81.0000 mg | ORAL_TABLET | Freq: Every day | ORAL | 6 refills | Status: AC

## 2016-02-12 MED ORDER — LISINOPRIL 5 MG PO TABS: 5.0000 mg | ORAL_TABLET | Freq: Every day | ORAL | 6 refills | Status: DC

## 2016-02-12 MED ORDER — NITROGLYCERIN 0.4 MG SL SUBL: 0.4000 mg | SUBLINGUAL_TABLET | SUBLINGUAL | 3 refills | Status: DC | PRN

## 2016-02-12 MED ORDER — MAGNESIUM HYDROXIDE 400 MG/5ML PO SUSP: 30.0000 mL | Freq: Every day | ORAL | Status: DC | PRN

## 2016-02-12 MED ORDER — ATORVASTATIN CALCIUM 80 MG PO TABS: 80.0000 mg | ORAL_TABLET | Freq: Every day | ORAL | 6 refills | Status: DC

## 2016-02-12 MED ORDER — METOPROLOL TARTRATE 25 MG PO TABS: 25.0000 mg | ORAL_TABLET | Freq: Two times a day (BID) | ORAL | 6 refills | Status: DC

## 2016-02-12 MED ORDER — CLOPIDOGREL BISULFATE 75 MG PO TABS: 75.0000 mg | ORAL_TABLET | Freq: Every day | ORAL | 6 refills | Status: DC

## 2016-02-12 NOTE — Telephone Encounter (Signed)
-----   Message from Ok Anishristopher R Berge, NP sent at 02/12/2016  9:43 AM EST ----- Hi,  [name] is being d/c'd today and has f/u with D Dunn on 2/15.  Could you pls call w/in 48hrs for TCM?  Thanks,  Thayer Ohmhris

## 2016-02-12 NOTE — Discharge Summary (Signed)
Discharge Summary    Patient ID: Diamond Hughes,  MRN: [id number], DOB/AGE: [birth year] 66 y.o.  Admit date: 02/08/2016 Discharge date: 02/12/2016  Primary Care Provider: No PCP Per Patient Primary Cardiologist: C. Veleka Djordjevic, MD   Discharge Diagnoses    Principal Problem:   NSTEMI (non-ST elevated myocardial infarction) (HCC)  **S/p PCI/DES to the LCX.  Unsuccessful attempt @ PCI of the RCA. Active Problems:   CAD (coronary artery disease)   HTN (hypertension)   Normocytic anemia   Hyperlipidemia  Allergies No Known Allergies  Diagnostic Studies/Procedures    2D Echocardiogram 2.4.2018  Study Conclusions   - Left ventricle: The cavity size was normal. Wall thickness was   normal. Systolic function was mildly reduced. The estimated   ejection fraction was in the range of 45% to 50%. Basal to mid   inferior/posterior wall hypokinesis. Doppler parameters are   consistent with abnormal left ventricular relaxation (grade 1   diastolic dysfunction). The E/e&' ratio is between 8-15,   suggesting indeterminate LV filling pressure. - Mitral valve: Mildly thickened leaflets . There was trivial   regurgitation. - Left atrium: The atrium was normal in size. - Right ventricle: The cavity size was normal. Systolic function is   reduced. - Right atrium: The atrium was normal in size. - Inferior vena cava: The vessel was normal in size. The   respirophasic diameter changes were in the normal range (= 50%),   consistent with normal central venous pressure. _____________  Cardiac Catheterization and Percutaneous Coronary Intervention 2.5.2018  Coronary Findings  Dominance: Co-dominant  Left Circumflex  Prox Cx lesion, 100% stenosed. The lesion is type B2 and thrombotic.  Angioplasty: The LCX was successfully stented using a 3.5 x 18 mm Resolute Onyx DES.  There is no residual stenosis post intervention.  First Obtuse Marginal Branch  Vessel is small in size.  Third Obtuse  Marginal Branch  Vessel is small in size.  Right Coronary Artery  Prox RCA lesion, 70% stenosed. Aneurysmal segment noted.  Mid RCA to Dist RCA lesion, 95% stenosed. The lesion was previously treated.  Angioplasty: Attempt @ PCI was performed but was unsuccessful due to inability to cross the lesion with balloon. No complications occurred at this lesion.  There is a 95% residual stenosis post intervention.  Dist RCA lesion, 95% stenosed.   Left Ventricle The left ventricular size is normal. There is mild left ventricular systolic dysfunction. The left ventricular ejection fraction is 45-50% by visual estimate. There are LV function abnormalities due to segmental dysfunction.   History of Present Illness     66 year old female with a prior history of coronary artery disease status post stenting of the right coronary artery in the setting of myocardial infarction in 2000, in [place]. She also has a history of hypertension, hyperlipidemia, and normocytic anemia. She was in her usual state of health until January 28, which presented to the emergency department with chest pain. Recommendation was made for admission, however she left AMA. Unfortunately, she had recurrent chest discomfort associated with dyspnea and diaphoresis on February 3, prompting her to present back to the emergency department. There, she was noted to have new inferolateral T-wave inversion with troponin elevation to 2.55. She was treated with IV heparin and nitroglycerin with improvement in symptoms. She was admitted for further evaluation.  Hospital Course     Consultants: None  Patient ruled in for non-ST segment elevation myocardial infarction, with peak troponin of 8.22. she had no further  chest pain over the weekend. Echocardiogram was performed and showed an EF of 45-50% with basal to mid inferior and posterior wall hypokinesis. No significant valvular pathology was noted. On February 5, she underwent diagnostic  catheterization revealing a total occlusion of the left circumflex with severe in-stent restenosis throughout the mid and distal right coronary artery. The left main and LAD were normal. The left circumflex was successfully stented using a 3.5 x 18 mm resolute onyx drug-eluting stent. An attempt was made at crossing the lesion in the right coronary artery, however this was unsuccessful and therefore this will be medically managed. Post percutaneous intervention, she has been maintained on aspirin, Plavix, statin, beta blocker, and ACE inhibitor therapy. She has had no further chest pain.  She does have a history of normocytic anemia and hemoglobin and hematocrit dropped to 8.8 and 27.8 during this admission. For this reason, she was held in the hospital for 1 additional day. Labs are been stable over the past 24 hours and she will be discharged home today in good condition. She has been seen by cardiac rehabilitation and has been ambulating without difficulty. We have arranged for follow-up within the next 10 days and she will require a CBC at that time. _____________  Discharge Vitals Blood pressure (!) 109/59, pulse 89, temperature 98.4 F (36.9 C), temperature source Oral, resp. rate 16, height 5\' 3"  (1.6 m), weight 149 lb 14.6 oz (68 kg), SpO2 95 %.  Filed Weights   02/08/16 1031 02/11/16 0629 02/12/16 0635  Weight: 152 lb (68.9 kg) 152 lb 5.4 oz (69.1 kg) 149 lb 14.6 oz (68 kg)    Labs & Radiologic Studies    CBC  Recent Labs  02/11/16 2358 02/12/16 0220  WBC 6.8 6.7  HGB 9.0* 8.8*  HCT 27.6* 27.8*  MCV 97.5 96.9  PLT 358 366   Basic Metabolic Panel  Recent Labs  02/11/16 0202 02/12/16 0220  NA 134* 135  K 3.8 3.9  CL 101 98*  CO2 25 27  GLUCOSE 112* 100*  BUN 12 9  CREATININE 0.84 0.77  CALCIUM 9.0 9.3   Cardiac Enzymes  Recent Labs  02/11/16 0202  TROPONINI 8.22*   _____________  Dg Chest 2 View  Result Date: 02/02/2016 CLINICAL DATA:  Shortness of breath  with cough and congestion 2 days. EXAM: CHEST  2 VIEW COMPARISON:  12/18/2003 FINDINGS: Lungs are adequately inflated without focal airspace consolidation or effusion. Borderline stable cardiomegaly. Mild degenerate change of the spine. IMPRESSION: No active cardiopulmonary disease. Electronically Signed   By: Elberta Fortisaniel  Boyle M.D.   On: 02/02/2016 22:28   Disposition   Pt is being discharged home today in good condition.  Follow-up Plans & Appointments    Follow-up Information    [facility] Follow up on 02/13/2016.   Why:  9 am for hospital follow up Contact information: [address] [place] [id number] [phone]       Laurann Montanaayna N Dunn, PA-C Follow up on 02/20/2016.   Specialties:  Cardiology, Radiology Why:  2:30 PM Contact information: [address] [place] [address] [phone]          Discharge Instructions    Amb Referral to Cardiac Rehabilitation    Complete by:  As directed    Diagnosis:   Coronary Stents NSTEMI        Discharge Medications   Current Discharge Medication List    START taking these medications  Details  atorvastatin (LIPITOR) 80 MG tablet Take 1 tablet (80 mg total) by mouth daily at 6 PM. Qty: 30 tablet, Refills: 6    clopidogrel (PLAVIX) 75 MG tablet Take 1 tablet (75 mg total) by mouth daily with breakfast. Qty: 30 tablet, Refills: 6    nitroGLYCERIN (NITROSTAT) 0.4 MG SL tablet Place 1 tablet (0.4 mg total) under the tongue every 5 (five) minutes x 3 doses as needed for chest pain. Qty: 25 tablet, Refills: 3      CONTINUE these medications which have CHANGED   Details  aspirin 81 MG tablet Take 1 tablet (81 mg total) by mouth daily. Qty: 30 tablet, Refills: 6    lisinopril (PRINIVIL,ZESTRIL) 5 MG tablet Take 1 tablet (5 mg total) by mouth daily. Qty: 30 tablet, Refills: 6    metoprolol tartrate (LOPRESSOR) 25 MG tablet Take 1 tablet (25 mg total) by  mouth 2 (two) times daily. Qty: 60 tablet, Refills: 6      STOP taking these medications     hydrochlorothiazide (MICROZIDE) 12.5 MG capsule      Ibuprofen-Diphenhydramine HCl (ADVIL PM) 200-25 MG CAPS      lovastatin (MEVACOR) 20 MG tablet          Aspirin prescribed at discharge?  Yes High Intensity Statin Prescribed? (Lipitor 40-80mg  or Crestor 20-40mg ): Yes Beta Blocker Prescribed? Yes For EF <40%, was ACEI/ARB Prescribed? No: N/A ADP Receptor Inhibitor Prescribed? (i.e. Plavix etc.-Includes Medically Managed Patients): Yes For EF <40%, Aldosterone Inhibitor Prescribed? No: N/A Was EF assessed during THIS hospitalization? Yes Was Cardiac Rehab II ordered? (Included Medically managed Patients): Yes   Outstanding Labs/Studies   CBC @ f/u.   Lipids/lft's in 6 wks (new statin).  Duration of Discharge Encounter   Greater than 30 minutes including physician time.  Signed, Nicolasa Ducking NP 02/12/2016, 9:50 AM   I have personally seen and examined this patient. I agree with the assessment and plan as outlined above.

## 2016-02-12 NOTE — Progress Notes (Signed)
Progress Note  Patient Name: Diamond Hughes Date of Encounter: 02/12/2016  Primary Cardiologist: Diamond Hughes admitted but will be Diamond Hughes  Subjective   No chest pain or dyspnea this am  Inpatient Medications    Scheduled Meds: . aspirin EC  81 mg Oral Daily  . atorvastatin  80 mg Oral q1800  . clopidogrel  75 mg Oral Q breakfast  . heparin  5,000 Units Subcutaneous Q8H  . lisinopril  5 mg Oral Daily  . metoprolol tartrate  25 mg Oral BID  . sodium chloride flush  3 mL Intravenous Q12H   Continuous Infusions: . nitroGLYCERIN Stopped (02/09/16 0530)   PRN Meds: sodium chloride, acetaminophen, magnesium hydroxide, nitroGLYCERIN, ondansetron (ZOFRAN) IV, oxyCODONE-acetaminophen, sodium chloride flush   Vital Signs    Vitals:   02/11/16 1638 02/11/16 1708 02/11/16 1942 02/12/16 0635  BP: 115/86  122/61 (!) 109/59  Pulse: 75  85 89  Resp: '17  18 16  ' Temp: 98.7 F (37.1 C)  99 F (37.2 C) 98.4 F (36.9 C)  TempSrc: Oral  Oral Oral  SpO2:  99% 98% 95%  Weight:    149 lb 14.6 oz (68 kg)  Height:        Intake/Output Summary (Last 24 hours) at 02/12/16 0823 Last data filed at 02/12/16 0734  Gross per 24 hour  Intake             2040 ml  Output                0 ml  Net             2040 ml   Filed Weights   02/08/16 1031 02/11/16 0629 02/12/16 0635  Weight: 152 lb (68.9 kg) 152 lb 5.4 oz (69.1 kg) 149 lb 14.6 oz (68 kg)    Telemetry    Sinus - Personally Reviewed  Physical Exam   GEN: No acute distress.   Neck: No JVD Cardiac: RRR, no murmurs, rubs, or gallops.  Respiratory: Clear to auscultation bilaterally. GI: Soft, nontender, non-distended  MS: No edema; No deformity. Neuro:  Nonfocal  Psych: Normal affect   Labs    Chemistry  Recent Labs Lab 02/10/16 0352 02/10/16 1421 02/11/16 0202 02/12/16 0220  NA 131*  --  134* 135  K 3.8  --  3.8 3.9  CL 98*  --  101 98*  CO2 25  --  25 27  GLUCOSE 125*  --  112* 100*  BUN 12  --  12 9  CREATININE  0.77 0.79 0.84 0.77  CALCIUM 9.1  --  9.0 9.3  GFRNONAA >60 >60 >60 >60  GFRAA >60 >60 >60 >60  ANIONGAP 8  --  8 10     Hematology  Recent Labs Lab 02/11/16 0202 02/11/16 2358 02/12/16 0220  WBC 6.1 6.8 6.7  RBC 2.98* 2.83* 2.87*  HGB 9.3* 9.0* 8.8*  HCT 29.0* 27.6* 27.8*  MCV 97.3 97.5 96.9  MCH 31.2 31.8 30.7  MCHC 32.1 32.6 31.7  RDW 13.0 13.0 12.7  PLT 349 358 366    Cardiac Enzymes  Recent Labs Lab 02/08/16 1212 02/09/16 0221 02/11/16 0202  TROPONINI 2.89* 6.99* 8.22*     Recent Labs Lab 02/08/16 1058  TROPIPOC 2.55*     BNPNo results for input(s): BNP, PROBNP in the last 168 hours.   DDimer No results for input(s): DDIMER in the last 168 hours.   Radiology    No results found.  Cardiac Studies  Echo 02/09/16: Left ventricle: The cavity size was normal. Wall thickness was   normal. Systolic function was mildly reduced. The estimated   ejection fraction was in the range of 45% to 50%. Basal to mid   inferior/posterior wall hypokinesis. Doppler parameters are   consistent with abnormal left ventricular relaxation (grade 1   diastolic dysfunction). The E/e&' ratio is between 8-15,   suggesting indeterminate LV filling pressure. - Mitral valve: Mildly thickened leaflets . There was trivial   regurgitation. - Left atrium: The atrium was normal in size. - Right ventricle: The cavity size was normal. Systolic function is   reduced. - Right atrium: The atrium was normal in size. - Inferior vena cava: The vessel was normal in size. The   respirophasic diameter changes were in the normal range (= 50%),   consistent with normal central venous pressure.  Impressions:  - LVEF 45-50%, normal wall thickness, basal to mid   inferior/posterior hypokinesis, grade 1 DD, indeterminate LV   filling pressure, normal LA size, normal IVC.  Cardiac cath 02/10/16: Coronary Findings   Dominance: Co-dominant  Left Circumflex  Prox Cx lesion, 100% stenosed. The  lesion is type B2 and thrombotic.  Angioplasty: Lesion crossed with guidewire using a WIRE ASAHI PROWATER 180CM. Pre-stent angioplasty was performed using a BALLOON MOZEC 2.0X15. Maximum pressure: 8 atm. A STENT RESOLUTE ONYX 3.5X18 drug eluting stent was successfully placed, and does not overlap previously placed stent. Stent strut is well apposed. The pre-interventional distal flow is decreased (TIMI 1). The post-interventional distal flow is normal (TIMI 3). The intervention was successful . No complications occurred at this lesion. Pressure wire/FFR was not performed on the lesion . IVUS was not performed on the lesion .  There is no residual stenosis post intervention.  First Obtuse Marginal Branch  Vessel is small in size.  Third Obtuse Marginal Branch  Vessel is small in size.  Right Coronary Artery  Prox RCA lesion, 70% stenosed. Aneurysmal segment noted.  Mid RCA to Dist RCA lesion, 95% stenosed. The lesion was previously treated.  Angioplasty: Lesion crossed with guidewire using a WIRE ASAHI PROWATER 180CM. Pre-stent angioplasty was not performed. A stent was not successfully placed. Post-stent angioplasty was not performed. The pre-interventional distal flow is normal (TIMI 3). The post-interventional distal flow is normal (TIMI 3). The intervention was unsuccessful due to inability to cross the lesion with balloon. No complications occurred at this lesion.  There is a 95% residual stenosis post intervention.  Dist RCA lesion, 95% stenosed.  Wall Motion              Left Heart   Left Ventricle The left ventricular size is normal. There is mild left ventricular systolic dysfunction. The left ventricular ejection fraction is 45-50% by visual estimate. There are LV function abnormalities due to segmental dysfunction.    Coronary Diagrams   Diagnostic Diagram     Post-Intervention Diagram         Patient Profile     66 y.o. female with history of CAD, HTN, tobacco abuse  admitted with chest pain/NSTEMI  Assessment & Plan    1. CAD/NSTEMI: Pt with h/o CAD. Admitted with chest pain. Troponin elevated. Cardiac cath 02/10/16 with severe stenosis in the Circumflex treated with a DES x 1. Unable to cross the severe stenosis in the aneurysmal RCA. Plans for medical management only of RCA lesion. Will continue ASA, Plavix, statin, beta blocker, Ace-inh.   2. HTN: BP stable  3. Tobacco abuse:  smoking cessation recommended.   4. Anemia, post procedure: H/H overall stable. Likely due to blood losses during procedure. Will repeat H/H in one week in the office.    5. Ischemic cardiomyopathy: Continue beta blocker and Ace-inh.   Will discharge home today. She will need f/u early next week with office APP at [facility] with CBC. I will follow her long term.    Signed, Lauree Chandler, MD  02/12/2016, 8:23 AM

## 2016-02-12 NOTE — Progress Notes (Signed)
CARDIAC REHAB PHASE I   PRE:  Rate/Rhythm: 80 SR  BP:  Sitting: 130/72        SaO2: 98 RA  MODE:  Ambulation: 800 ft   POST:  Rate/Rhythm: 101 ST  BP:  Sitting: 142/80         SaO2: 100 RA  Pt states she feels good this morning, hopeful to go home. Pt ambualted 800 ft on RA, independent, steady gait, tolerated well. Pt c/o very mild DOE, denies any other complaints, declined rest stop. Pt states she has no questions regarding education at this time. Pt to recliner after walk, call bell within reach.   [phone] Diamond GrapesEmily C Senetra Dillin, RN, BSN 02/12/2016 8:39 AM

## 2016-02-12 NOTE — Discharge Instructions (Signed)
**  PLEASE REMEMBER TO BRING ALL OF YOUR MEDICATIONS TO EACH OF YOUR FOLLOW-UP OFFICE VISITS. ° °NO HEAVY LIFTING X 2 WEEKS. °NO SEXUAL ACTIVITY X 2 WEEKS. °NO DRIVING X 1 WEEK. °NO SOAKING BATHS, HOT TUBS, POOLS, ETC., X 7 DAYS. ° °Radial Site Care °Refer to this sheet in the next few weeks. These instructions provide you with information on caring for yourself after your procedure. Your caregiver may also give you more specific instructions. Your treatment has been planned according to current medical practices, but problems sometimes occur. Call your caregiver if you have any problems or questions after your procedure. °HOME CARE INSTRUCTIONS °· You may shower the day after the procedure. Remove the bandage (dressing) and gently wash the site with plain soap and water. Gently pat the site dry.  °· Do not apply powder or lotion to the site.  °· Do not submerge the affected site in water for 3 to 5 days.  °· Inspect the site at least twice daily.  °· Do not flex or bend the affected arm for 24 hours.  °· No lifting over 5 pounds (2.3 kg) for 5 days after your procedure.  °· Do not drive home if you are discharged the same day of the procedure. Have someone else drive you.  ° °What to expect: °· Any bruising will usually fade within 1 to 2 weeks.  °· Blood that collects in the tissue (hematoma) may be painful to the touch. It should usually decrease in size and tenderness within 1 to 2 weeks.  °SEEK IMMEDIATE MEDICAL CARE IF: °· You have unusual pain at the radial site.  °· You have redness, warmth, swelling, or pain at the radial site.  °· You have drainage (other than a small amount of blood on the dressing).  °· You have chills.  °· You have a fever or persistent symptoms for more than 72 hours.  °· You have a fever and your symptoms suddenly get worse.  °· Your arm becomes pale, cool, tingly, or numb.  °· You have heavy bleeding from the site. Hold pressure on the site.  ° °  ° °10 Habits of Highly Healthy  People ° °[facility] wants to help you get well and stay well.  Live a longer, healthier life by practicing healthy habits every day. ° °1.  Visit your primary care provider regularly. °2.  Make time for family and friends.  Healthy relationships are important. °3.  Take medications as directed by your provider. °4.  Maintain a healthy weight and a trim waistline. °5.  Eat healthy meals and snacks, rich in fruits, vegetables, whole grains, and lean proteins. °6.  Get moving every day - aim for 150 minutes of moderate physical activity each week. °7.  Don't smoke. °8.  Avoid alcohol or drink in moderation. °9.  Manage stress through meditation or mindful relaxation. °10.  Get seven to nine hours of quality sleep each night. ° °Want more information on healthy habits?  To learn more about these and other healthy habits, visit [email or web address]. °_____________ °  °  °

## 2016-02-13 ENCOUNTER — Encounter: Payer: Self-pay | Admitting: Physician Assistant

## 2016-02-13 ENCOUNTER — Ambulatory Visit: Payer: Self-pay | Attending: Internal Medicine | Admitting: Physician Assistant

## 2016-02-13 VITALS — BP 147/93 | HR 72 | Temp 98.2°F | Resp 16 | Wt 157.6 lb

## 2016-02-13 DIAGNOSIS — I10 Essential (primary) hypertension: Secondary | ICD-10-CM | POA: Insufficient documentation

## 2016-02-13 DIAGNOSIS — Z79899 Other long term (current) drug therapy: Secondary | ICD-10-CM | POA: Insufficient documentation

## 2016-02-13 DIAGNOSIS — Z7982 Long term (current) use of aspirin: Secondary | ICD-10-CM | POA: Insufficient documentation

## 2016-02-13 DIAGNOSIS — I214 Non-ST elevation (NSTEMI) myocardial infarction: Secondary | ICD-10-CM | POA: Insufficient documentation

## 2016-02-13 DIAGNOSIS — D649 Anemia, unspecified: Secondary | ICD-10-CM | POA: Insufficient documentation

## 2016-02-13 DIAGNOSIS — Z5189 Encounter for other specified aftercare: Secondary | ICD-10-CM | POA: Insufficient documentation

## 2016-02-13 LAB — IRON,TIBC AND FERRITIN PANEL
%SAT: 19 % (ref 11–50)
FERRITIN: 99 ng/mL (ref 20–288)
IRON: 72 ug/dL (ref 45–160)
TIBC: 387 ug/dL (ref 250–450)

## 2016-02-13 MED ORDER — LISINOPRIL 10 MG PO TABS
10.0000 mg | ORAL_TABLET | Freq: Every day | ORAL | 3 refills | Status: DC
Start: 2016-02-13 — End: 2017-09-02

## 2016-02-13 NOTE — Patient Instructions (Signed)
Check blood pressure out of office daily and record and bring to next visit

## 2016-02-13 NOTE — Progress Notes (Signed)
Diamond Hughes, is a 66 y.o. female  ZOX:[id number]  WJX:[id number]  DOB - 1950/07/29  Subjective:  Chief Complaint and HPI: Diamond Hughes is a 66 y.o. female here today to establish care and for a follow up visit after being hospitalized with NSTEMI 02/08/2016-02/12/2016.   NSTEMI (non-ST elevated myocardial infarction) (HCC)           **S/p PCI/DES to the LCX.  Unsuccessful attempt @ PCI of the RCA. Active Problems:   CAD (coronary artery disease)   HTN (hypertension)   Normocytic anemia   Hyperlipidemia  On cardiac cath: Dominance: Co-dominant  Left Circumflex  Prox Cx lesion, 100% stenosed. The lesion is type B2 and thrombotic.  Angioplasty: The LCX was successfully stented using a 3.5 x 18 mm Resolute Onyx DES.  There is no residual stenosis post intervention.  First Obtuse Marginal Branch  Vessel is small in size.  Third Obtuse Marginal Branch  Vessel is small in size.  Right Coronary Artery  Prox RCA lesion, 70% stenosed. Aneurysmal segment noted.  Mid RCA to Dist RCA lesion, 95% stenosed. The lesion was previously treated.  Angioplasty: Attempt @ PCI was performed but was unsuccessful due to inability to cross the lesion with balloon. No complications occurred at this lesion.  There is a 95% residual stenosis post intervention.  Dist RCA lesion, 95% stenosed   Today, she is here for follow-up.  She is feeling great.  Says her husband makes her BP go up.  She denies any CP/SOB today.  She sees the cardiologist next week for f/up.  She says she has had long-standing anemia and is not on iron.  She has never had colonoscopy.  BP not controlled today.  Kidney function has been normal.  ED/Hospital notes reviewed.    ROS:   Constitutional:  No f/c, No night sweats, No unexplained weight loss. EENT:  No vision changes, No blurry vision, No hearing changes. No mouth, throat, or ear problems.  Respiratory: No cough, No SOB Cardiac: No CP, no palpitations GI:  No abd  pain, No N/V/D. GU: No Urinary s/sx Musculoskeletal: No joint pain Neuro: No headache, no dizziness, no motor weakness.  Skin: No rash Endocrine:  No polydipsia. No polyuria.  Psych: Denies SI/HI  No problems updated.  ALLERGIES: No Known Allergies  PAST MEDICAL HISTORY: Past Medical History:  Diagnosis Date  . CAD (coronary artery disease)    a. 2000 s/p MI and RCA PCI [facility]);  b. 02/2016 NSTEMI/PCI: LM nl, LAD nl, LCX 100p (3.5x18 Resolute Onyx DES), OM1/3 small, RCA 70p aneurysmal, 50m/d ISR-->attempted PCI, could not cross w/ wire-->med rx, EF 45-50%.  . Hyperlipidemia   . Hypertension   . Ischemic cardiomyopathy    a. 02/2016 Echo: EF 45-50%, basal-mid inferior/posterior HK, Gr1 DD, triv MR, nl LA size, nl RV fxn.    MEDICATIONS AT HOME: Prior to Admission medications   Medication Sig Start Date End Date Taking? Authorizing Provider  aspirin 81 MG tablet Take 1 tablet (81 mg total) by mouth daily. 02/12/16   Ok Anis, NP  atorvastatin (LIPITOR) 80 MG tablet Take 1 tablet (80 mg total) by mouth daily at 6 PM. 02/12/16   Ok Anis, NP  clopidogrel (PLAVIX) 75 MG tablet Take 1 tablet (75 mg total) by mouth daily with breakfast. 02/13/16   Ok Anis, NP  lisinopril (PRINIVIL,ZESTRIL) 10 MG tablet Take 1 tablet (10 mg total) by mouth daily. 02/13/16   Anders Simmonds, PA-C  metoprolol tartrate (LOPRESSOR) 25 MG tablet Take 1 tablet (25 mg total) by mouth 2 (two) times daily. 02/12/16   Ok Anishristopher R Berge, NP  nitroGLYCERIN (NITROSTAT) 0.4 MG SL tablet Place 1 tablet (0.4 mg total) under the tongue every 5 (five) minutes x 3 doses as needed for chest pain. 02/12/16   Ok Anishristopher R Berge, NP     Objective:  EXAM:   Vitals:   02/13/16 0938  BP: (!) 147/93  Pulse: 72  Resp: 16  Temp: 98.2 F (36.8 C)  TempSrc: Oral  SpO2: 100%  Weight: 157 lb 9.6 oz (71.5 kg)    General appearance : A&OX3. NAD. Non-toxic-appearing HEENT: Atraumatic and  Normocephalic.  PERRLA. EOM intact.  TM clear B. Mouth-MMM, post pharynx WNL w/o erythema, No PND. Neck: supple, no JVD. No cervical lymphadenopathy. No thyromegaly Chest/Lungs:  Breathing-non-labored, Good air entry bilaterally, breath sounds normal without rales, rhonchi, or wheezing  CVS: S1 S2 regular, no murmurs, gallops, rubs  Abdomen: Bowel sounds present, Non tender and not distended with no gaurding, rigidity or rebound. Extremities: Bilateral Lower Ext shows no edema, both legs are warm to touch with = pulse throughout Neurology:  CN II-XII grossly intact, Non focal.   Psych:  TP linear. J/I WNL. Normal speech. Appropriate eye contact and affect.  Skin:  No Rash  Data Review Lab Results  Component Value Date   HGBA1C 5.7 (H) 02/08/2016     Assessment & Plan   1. Essential hypertension Not controlled Check blood pressure out of office daily and record and bring to next visit Increase dose- lisinopril (PRINIVIL,ZESTRIL) 10 MG tablet; Take 1 tablet (10 mg total) by mouth daily.  Dispense: 90 tablet; Refill: 3  2. Normocytic anemia Will need colonoscopy referral when she gets insurance/orange card - Iron, TIBC and Ferritin Panel  3. NSTEMI (non-ST elevated myocardial infarction) [facility](HCC) Keep cardiology appt next week to determine when she can go back to work   Patient have been counseled extensively about nutrition and exercise  Return in about 3 weeks (around 03/05/2016) for assign PCP; f/up htn and anemia.  The patient was given clear instructions to go to ER or return to medical center if symptoms don't improve, worsen or new problems develop. The patient verbalized understanding. The patient was told to call to get lab results if they haven't heard anything in the next week.     Georgian CoAngela McClung, PA-C [facility] [place], [place] [phone]   02/13/2016, 9:56 AM

## 2016-02-13 NOTE — Telephone Encounter (Signed)
Left pt a message to call back. 

## 2016-02-14 ENCOUNTER — Telehealth: Payer: Self-pay

## 2016-02-14 NOTE — Telephone Encounter (Signed)
Patient contacted [facility] on [facility].  Patient understands to follow up with provider Ronie Spiesayna Dunn, PA on 02/20/16 at 2:30 PM at [address], [place], [place] [id number].  Patient understands discharge instructions? Yes  Patient understands medications and regiment? Yes  Patient understands to bring all medications to this visit? Yes

## 2016-02-14 NOTE — Telephone Encounter (Signed)
Contacted pt to go over lab results pt is aware of results and doesn't have any questions or concerns 

## 2016-02-14 NOTE — Telephone Encounter (Signed)
Left message for patient to call back  

## 2016-02-20 ENCOUNTER — Encounter: Payer: Self-pay | Admitting: Physician Assistant

## 2016-02-20 ENCOUNTER — Ambulatory Visit (INDEPENDENT_AMBULATORY_CARE_PROVIDER_SITE_OTHER): Payer: Self-pay | Admitting: Physician Assistant

## 2016-02-20 VITALS — BP 122/84 | HR 77 | Ht 62.5 in | Wt 159.0 lb

## 2016-02-20 DIAGNOSIS — I255 Ischemic cardiomyopathy: Secondary | ICD-10-CM | POA: Insufficient documentation

## 2016-02-20 DIAGNOSIS — I2510 Atherosclerotic heart disease of native coronary artery without angina pectoris: Secondary | ICD-10-CM

## 2016-02-20 DIAGNOSIS — I10 Essential (primary) hypertension: Secondary | ICD-10-CM

## 2016-02-20 DIAGNOSIS — D649 Anemia, unspecified: Secondary | ICD-10-CM

## 2016-02-20 DIAGNOSIS — E785 Hyperlipidemia, unspecified: Secondary | ICD-10-CM

## 2016-02-20 DIAGNOSIS — I493 Ventricular premature depolarization: Secondary | ICD-10-CM | POA: Insufficient documentation

## 2016-02-20 NOTE — Progress Notes (Signed)
Cardiology Office Note    Date:  02/20/2016  ID:  Diamond Hughes, DOB [birth year], MRN [id number] PCP:  No PCP Per Patient  Cardiologist:  Dr. Clifton James   Chief Complaint: f/u heart attack  History of Present Illness:  Diamond Hughes is a 66 y.o. female with history of CAD (MI/stent to RCA 2000 in [place], NSTEMI 02/2016 s/p ), HTN, HLD, normocytic anemia, recent ischemic cardiomyopathy who presents for post-hospital follow-up.  She was recently admitted for NSTEMI (trop 8). She had initially sought care in 01/2016 for chest pain but left the ER AMA. She presented back 02/08/16 with CP/SOB and elevated troponin. 2D Echo 02/09/16: EF 45-50%, basal to mid inf/post HK, grade 1 DD. 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 She had DES placed to the Cx. There was failed angioplasty of the right coronary in-stent restenosis and distal de novo disease due to inability to cross with the balloon. She was observed an additional day due to her anemia which was deemed stable. Pertinent recent include K 3.9, Cr 0.77, most recent Hgb 8.8 (prev 10-11), LDL 110, A1c 5.7, LFTs OK 08/2954. She has since seen PCP on 02/13/16 who ordered iron tests. Lisinopril was increased due to increased BP.  She presents back to clinic today feeling "great." Denies any recurrent chest pain, pressure, nausea, vomiting. No syncope, LEE, orthopnea. She is noted to have frequent PVCs on EKG today. She does report a history of palpitations that last about an hour or so at a time then go away. These were present even before recent MI. She seems to notice them after she takes her medication, but this is not always the case. She admits she drank 3 glasses of Merlot last night and wonders if this could be the cause. She used to drink regularly but has cut down lately so this increased amount was unusual  for her.   Past Medical History:  Diagnosis Date  . CAD (coronary artery disease)    a. 2000 s/p MI and RCA PCI [facility]);  b. 02/2016 NSTEMI/PCI: LM nl, LAD nl, LCX 100p (3.5x18 Resolute Onyx DES), OM1/3 small, RCA 70p aneurysmal, 33m/d ISR-->attempted PCI, could not cross w/ wire-->med rx, EF 45-50%.  . Hyperlipidemia   . Hypertension   . Ischemic cardiomyopathy    a. 02/2016 Echo: EF 45-50%, basal-mid inferior/posterior HK, Gr1 DD, triv MR, nl LA size, nl RV fxn.  . Normocytic anemia     Past Surgical History:  Procedure Laterality Date  . ANGIOPLASTY  2000  . CORONARY STENT INTERVENTION N/A 02/10/2016   Procedure: Coronary Stent Intervention;  Surgeon: Lyn Records, MD;  Location: [facility] INVASIVE CV LAB;  Service: Cardiovascular;  Laterality: N/A;  . LEFT HEART CATH AND CORONARY ANGIOGRAPHY N/A 02/10/2016   Procedure: Left Heart Cath and Coronary Angiography;  Surgeon: Lyn Records, MD;  Location: [facility] INVASIVE CV LAB;  Service: Cardiovascular;  Laterality: N/A;    Current Medications: Current Outpatient Prescriptions  Medication Sig Dispense Refill  . aspirin 81 MG tablet Take 1 tablet (81 mg total) by mouth daily. 30 tablet 6  . atorvastatin (LIPITOR) 80 MG tablet Take 1 tablet (80 mg total) by mouth daily at 6 PM. 30 tablet 6  . clopidogrel (PLAVIX) 75 MG tablet Take 1 tablet (75 mg total) by mouth daily with breakfast.  30 tablet 6  . lisinopril (PRINIVIL,ZESTRIL) 10 MG tablet Take 1 tablet (10 mg total) by mouth daily. 90 tablet 3  . metoprolol tartrate (LOPRESSOR) 25 MG tablet Take 1 tablet (25 mg total) by mouth 2 (two) times daily. 60 tablet 6  . nitroGLYCERIN (NITROSTAT) 0.4 MG SL tablet Place 1 tablet (0.4 mg total) under the tongue every 5 (five) minutes x 3 doses as needed for chest pain. 25 tablet 3   No current facility-administered medications for this visit.      Allergies:   Patient has no known allergies.   Social History   Social History  . Marital status:  Married    Spouse name: N/A  . Number of children: N/A  . Years of education: N/A   Social History Main Topics  . Smoking status: Current Every Day Smoker    Packs/day: 0.20    Years: 15.00    Types: Cigarettes  . Smokeless tobacco: Never Used  . Alcohol use Yes     Comment: 40oz beer  . Drug use: No  . Sexual activity: Yes    Birth control/ protection: None   Other Topics Concern  . None   Social History Narrative  . None     Family History:  The patient's family history includes Heart disease in her mother.  ROS:   Please see the history of present illness.  All other systems are reviewed and otherwise negative.    PHYSICAL EXAM:   VS:  BP 122/84   Pulse 77   Ht 5' 2.5" (1.588 m)   Wt 159 lb (72.1 kg)   BMI 28.62 kg/m   BMI: Body mass index is 28.62 kg/m. GEN: Well nourished, well developed AAF, in no acute distress  HEENT: normocephalic, atraumatic Neck: no JVD, carotid bruits, or masses Cardiac: RRR; no murmurs, rubs, or gallops, no edema  Respiratory:  clear to auscultation bilaterally, normal work of breathing GI: soft, nontender, nondistended, + BS MS: no deformity or atrophy  Skin: warm and dry, no rash Neuro:  Alert and Oriented x 3, Strength and sensation are intact, follows commands Psych: euthymic mood, full affect  Wt Readings from Last 3 Encounters:  02/20/16 159 lb (72.1 kg)  02/13/16 157 lb 9.6 oz (71.5 kg)  02/12/16 149 lb 14.6 oz (68 kg)      Studies/Labs Reviewed:   EKG:  EKG was ordered today and personally reviewed by me and demonstrates NSR with frequent PVCs, TWI III, avF, nonspecific ST- changes otherwise, isorhythmic dissociation (reviewed with Dr. Anne Fu as the PVCs later in her EKG appear to have P waves beforehand.  Recent Labs: 02/02/2016: ALT 14 02/12/2016: BUN 9; Creatinine, Ser 0.77; Hemoglobin 8.8; Platelets 366; Potassium 3.9; Sodium 135   Lipid Panel    Component Value Date/Time   CHOL 210 (H) 02/09/2016 0221   TRIG  95 02/09/2016 0221   HDL 81 02/09/2016 0221   CHOLHDL 2.6 02/09/2016 0221   VLDL 19 02/09/2016 0221   LDLCALC 110 (H) 02/09/2016 0221    Additional studies/ records that were reviewed today include: Summarized above.    ASSESSMENT & PLAN:   1. CAD - doing well s/p PCI. No recurrent angina so suspect we can continue to treat her RCA lesion medically at this time. Continue ASA, BB, statin, Plavix. Will hold off on releasing to full exercise until #2 is fully evaluated. 2. Frequent PVCs - she does report h/o intermittent palpitations, more noticeable the last 2 days. She was  not having them by the time I examined her, and HR was regular. She admits she wonders if today's episode was precipitated by drinking 3 glasses of Merlot last night. She has cut down alcohol intake completely lately, so this was unusual for her. I agree with the decision to stop alcohol. We discussed risk of bleeding with alcohol and DAPT. Will check BMET, Mg, CBC today. I discussed possibility of 24-hour Holter to quantify PVCs with her today but she does not have insurance. At this time she has opted to return in 1 week for EKG after no further drinking. If still having frequent ectopy at that time I would recommend proceeding with Holter with possible beta blocker titration. 3. Ischemic cardiomyopathy - appears euvolemic. Reviewed CHF precautions with patient including 2g sodium and 2L (64oz) fluid restriction. 4. Essential HTN - controlled on present regimen. 5. Hyperlipidemia - if she continues to tolerate statin at next OV, plan to recommend f/u LFTs/lipids in about 4 weeks . 6. Normocytic anemia - f/u CBC today. PCP recently checked iron indices which were normal.  Disposition: F/u with care team APP in 1 week.  Medication Adjustments/Labs and Tests Ordered: Current medicines are reviewed at length with the patient today.  Concerns regarding medicines are outlined above. Medication changes, Labs and Tests ordered  today are summarized above and listed in the Patient Instructions accessible in Encounters.   Thomasene MohairSigned, Dayna Dunn PA-C  02/20/2016 3:10 PM    [facility] [address], [place], [place]  [address] Phone: [phone]; Fax: [phone]

## 2016-02-20 NOTE — Patient Instructions (Signed)
Medication Instructions:  Your physician recommends that you continue on your current medications as directed. Please refer to the Current Medication list given to you today.   Labwork: TODAY:  BMET, CBC, MAG, & TSH  Testing/Procedures: None ordered  Follow-Up: Your physician recommends that you schedule a follow-up appointment in: 1 WEEK WITH DR. Clifton JamesMCALHANY OR IF HE'S NOT AVAILABLE, APP ON HIS CARE TEAM IS FINE   Any Other Special Instructions Will Be Listed Below (If Applicable).     If you need a refill on your cardiac medications before your next appointment, please call your pharmacy.

## 2016-02-21 LAB — BASIC METABOLIC PANEL
BUN / CREAT RATIO: 18 (ref 12–28)
BUN: 16 mg/dL (ref 8–27)
CALCIUM: 9.9 mg/dL (ref 8.7–10.3)
CHLORIDE: 99 mmol/L (ref 96–106)
CO2: 24 mmol/L (ref 18–29)
Creatinine, Ser: 0.89 mg/dL (ref 0.57–1.00)
GFR, EST AFRICAN AMERICAN: 80 mL/min/{1.73_m2} (ref >59)
GFR, EST NON AFRICAN AMERICAN: 69 mL/min/{1.73_m2} (ref >59)
Glucose: 92 mg/dL (ref 65–99)
POTASSIUM: 4.5 mmol/L (ref 3.5–5.2)
SODIUM: 141 mmol/L (ref 134–144)

## 2016-02-21 LAB — CBC
Hematocrit: 28.4 % — ABNORMAL LOW (ref 34.0–46.6)
Hemoglobin: 9.8 g/dL — ABNORMAL LOW (ref 11.1–15.9)
MCH: 33.3 pg — ABNORMAL HIGH (ref 26.6–33.0)
MCHC: 34.5 g/dL (ref 31.5–35.7)
MCV: 97 fL (ref 79–97)
PLATELETS: 565 10*3/uL — AB (ref 150–379)
RBC: 2.94 x10E6/uL — AB (ref 3.77–5.28)
RDW: 13.8 % (ref 12.3–15.4)
WBC: 7.5 10*3/uL (ref 3.4–10.8)

## 2016-02-21 LAB — MAGNESIUM: Magnesium: 1.9 mg/dL (ref 1.6–2.3)

## 2016-02-21 LAB — TSH: TSH: 26.81 u[IU]/mL — ABNORMAL HIGH (ref 0.450–4.500)

## 2016-02-21 NOTE — Addendum Note (Signed)
Addended by: Sydnee CabalMACK, CHASITIE R on: 02/21/2016 11:32 AM   Modules accepted: Orders

## 2016-03-05 ENCOUNTER — Ambulatory Visit: Payer: Self-pay | Admitting: Family Medicine

## 2016-03-06 ENCOUNTER — Ambulatory Visit (INDEPENDENT_AMBULATORY_CARE_PROVIDER_SITE_OTHER): Payer: Self-pay | Admitting: Cardiology

## 2016-03-06 ENCOUNTER — Encounter: Payer: Self-pay | Admitting: Cardiology

## 2016-03-06 VITALS — BP 160/80 | HR 75 | Ht 63.0 in | Wt 162.4 lb

## 2016-03-06 DIAGNOSIS — I493 Ventricular premature depolarization: Secondary | ICD-10-CM

## 2016-03-06 MED ORDER — METOPROLOL TARTRATE 50 MG PO TABS: 50.0000 mg | ORAL_TABLET | Freq: Two times a day (BID) | ORAL | 3 refills | Status: DC

## 2016-03-06 NOTE — Progress Notes (Signed)
03/06/2016 Diamond Hughes   [birth year]  [id number]  Primary Physician No PCP Per Patient Primary Cardiologist: Dr. Clifton James    Reason for Visit/CC: F/u for PVCs and CAD  HPI:  The patient is a 66 year old female with a history of CAD status post prior MI with stent to the RCA in 2000 in [place]. She also has a history of hypertension and hyperlipidemia, microcytic anemia and recent history of recurrent MI and new ischemic cardiomyopathy.  She was recently admitted 02/08/16 with chest pain/shortness of breath and found to have an elevated troponin consistent with a non-STEMI. Troponin peaked at 8. 2-D echo showed an EF of 45-50% with basal to mid inferior posterior hypokinesis and grade 1 diastolic dysfunction. 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 She had DES placed to the Cx. There was failed angioplasty of the right coronary in-stent restenosis and distal de novo disease due to inability to cross with the balloon. Medical therapy was recommended for RCA lesion.   She presented back to clinic on 02/20/16 for post hospital f/u. She was seen by Ronie Spies, PA-C. She denied any angina and reported that she felt great. It was noted, however, that she had frequent PVCs on her EKG. After discussion the patient noted that she had had several glasses of wine the night prior. A 24 hr holter monitor was considered to assess PVC burden, however this was decided against given she does not have insurance. She was then instructed to f/u in 1 week for a repeat EKG. In addition, labs were obtained. CBC showed chronic stable anemia w. Hgb at 9.8. TSH was elevated. It was advised that she f/u with her PCP for further testing/ management. Mg was 1.9. K 3.9.  She is her today for f/u. EKG shows NSR. No PVCs noted, however she continues to have intermittent  palpitations at home. Her HR is 76 bpm. BP is 160/80. She reports full medication compliance. No chest pain. She is still w/o insurance. Admits to drinking red wine and coffee often.   Current Meds  Medication Sig  . aspirin 81 MG tablet Take 1 tablet (81 mg total) by mouth daily.  Marland Kitchen atorvastatin (LIPITOR) 80 MG tablet Take 1 tablet (80 mg total) by mouth daily at 6 PM.  . clopidogrel (PLAVIX) 75 MG tablet Take 1 tablet (75 mg total) by mouth daily with breakfast.  . lisinopril (PRINIVIL,ZESTRIL) 10 MG tablet Take 1 tablet (10 mg total) by mouth daily.  . nitroGLYCERIN (NITROSTAT) 0.4 MG SL tablet Place 1 tablet (0.4 mg total) under the tongue every 5 (five) minutes x 3 doses as needed for chest pain.  . [DISCONTINUED] metoprolol tartrate (LOPRESSOR) 25 MG tablet Take 1 tablet (25 mg total) by mouth 2 (two) times daily.   No Known Allergies Past Medical History:  Diagnosis Date  . CAD (coronary artery disease)    a. 2000 s/p MI and RCA PCI [facility]);  b. 02/2016 NSTEMI/PCI: LM nl, LAD nl, LCX 100p (3.5x18 Resolute Onyx DES), OM1/3 small, RCA 70p aneurysmal, 43m/d ISR-->attempted PCI, could not cross w/ wire-->med rx, EF 45-50%.  . Hyperlipidemia   . Hypertension   . Ischemic cardiomyopathy    a. 02/2016 Echo: EF 45-50%, basal-mid inferior/posterior HK, Gr1 DD, triv MR, nl LA size, nl RV fxn.  . Normocytic anemia  Family History  Problem Relation Age of Onset  . Heart disease Mother    Past Surgical History:  Procedure Laterality Date  . ANGIOPLASTY  2000  . CORONARY STENT INTERVENTION N/A 02/10/2016   Procedure: Coronary Stent Intervention;  Surgeon: Lyn Records, MD;  Location: [facility] INVASIVE CV LAB;  Service: Cardiovascular;  Laterality: N/A;  . LEFT HEART CATH AND CORONARY ANGIOGRAPHY N/A 02/10/2016   Procedure: Left Heart Cath and Coronary Angiography;  Surgeon: Lyn Records, MD;  Location: [facility] INVASIVE CV LAB;  Service: Cardiovascular;  Laterality: N/A;   Social History   Social  History  . Marital status: Married    Spouse name: N/A  . Number of children: N/A  . Years of education: N/A   Occupational History  . Not on file.   Social History Main Topics  . Smoking status: Current Every Day Smoker    Packs/day: 0.20    Years: 15.00    Types: Cigarettes  . Smokeless tobacco: Never Used  . Alcohol use Yes     Comment: 40oz beer  . Drug use: No  . Sexual activity: Yes    Birth control/ protection: None   Other Topics Concern  . Not on file   Social History Narrative  . No narrative on file     Review of Systems: General: negative for chills, fever, night sweats or weight changes.  Cardiovascular: negative for chest pain, dyspnea on exertion, edema, orthopnea, palpitations, paroxysmal nocturnal dyspnea or shortness of breath Dermatological: negative for rash Respiratory: negative for cough or wheezing Urologic: negative for hematuria Abdominal: negative for nausea, vomiting, diarrhea, bright red blood per rectum, melena, or hematemesis Neurologic: negative for visual changes, syncope, or dizziness All other systems reviewed and are otherwise negative except as noted above.   Physical Exam:  Blood pressure (!) 160/80, pulse 75, height 5\' 3"  (1.6 m), weight 162 lb 6.4 oz (73.7 kg), SpO2 97 %.  General appearance: alert, cooperative and no distress Neck: no carotid bruit and no JVD Lungs: clear to auscultation bilaterally Heart: regular rate and rhythm, S1, S2 normal, no murmur, click, rub or gallop Extremities: extremities normal, atraumatic, no cyanosis or edema Pulses: 2+ and symmetric Skin: Skin color, texture, turgor normal. No rashes or lesions Neurologic: Grossly normal  EKG NSR. No PVCs  ASSESSMENT AND PLAN:   1. PVCs: frequent PVC noted during last visit. This was after drinking 3 glasses of wine the night prior. K and Mg both WNL. No monitor ordered given lack of insurance. F/u EKG today shows NSR with ectopy however she admits to  continued palpitations at home. HR is 76. BP is 160/80. We will increase metoprolol to 50 mg BID. We discussed reducing caffeine and ETOH consumption.   2. CAD: s/p MI/stent to RCA 2000 in [place]. 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 She had DES placed to the Cx. There was failed angioplasty of the right coronary in-stent restenosis and distal de novo disease due to inability to cross with the balloon. Medical therapy elected. She is stable w/o recurrent CP. Continue current regimen. ASA, Plavix, statin and BB.   3. HTN: we will increase metoprolol to 50 mg BID.   4. Cardiomyopathy: EF 45-50%. Volume stable w/o dyspnea. Continue BB + ACE-I,   PLAN  F/u with Dr. Clifton James in 2-3  months   Robbie LisBrittainy Al Bracewell PA-C 03/06/2016 1:49 PM

## 2016-03-06 NOTE — Patient Instructions (Signed)
Medication Instructions:  1. INCREASE METOPROLOL TARTRATE TO 50 MG TWICE DAILY; NEW RX HAS BEEN SENT IN  Labwork: NONE  Testing/Procedures: NONE  Follow-Up: DR. Clifton JamesMCALHANY IN 2-3 MONTHS  Any Other Special Instructions Will Be Listed Below (If Applicable).     If you need a refill on your cardiac medications before your next appointment, please call your pharmacy.

## 2016-03-24 ENCOUNTER — Ambulatory Visit: Payer: Self-pay | Admitting: Family Medicine

## 2016-06-08 ENCOUNTER — Ambulatory Visit: Payer: Self-pay | Admitting: Cardiovascular Disease

## 2016-06-08 NOTE — Progress Notes (Deleted)
No chief complaint on file.    History of Present Illness: 66 yo female with history of tobacco abuse, CAD, HTN, ischemic cardiomyopathy who is here today for cardiac follow up. She had a stent placed in the RCA in 2000 in [place]. Admitted to [facility] February 2018 with CP/NSTEMI and was found to have a severe stenosis in the Circumflex treated with a DES and severe restenosis in the stented segment of the aneurysmal RCA which was not treated. 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 Echo February 2018 with LVEF=45-50%.   She is here today for follow up. The patient denies any chest pain, dyspnea, palpitations, lower extremity edema, orthopnea, PND, dizziness, near syncope or syncope.    Primary Care Physician: Patient, No Pcp Per   Past Medical History:  Diagnosis Date  . CAD (coronary artery disease)    a. 2000 s/p MI and RCA PCI [facility]);  b. 02/2016 NSTEMI/PCI: LM nl, LAD nl, LCX 100p (3.5x18 Resolute Onyx DES), OM1/3 small, RCA 70p aneurysmal, 95m/d ISR-->attempted PCI, could not cross w/ wire-->med rx, EF 45-50%.  . Hyperlipidemia   . Hypertension   . Ischemic cardiomyopathy    a. 02/2016 Echo: EF 45-50%, basal-mid inferior/posterior HK, Gr1 DD, triv MR, nl LA size, nl RV fxn.  . Normocytic anemia     Past Surgical History:  Procedure Laterality Date  . ANGIOPLASTY  2000  . CORONARY STENT INTERVENTION N/A 02/10/2016   Procedure: Coronary Stent Intervention;  Surgeon: Lyn Records, MD;  Location: [facility] INVASIVE CV LAB;  Service: Cardiovascular;  Laterality: N/A;  . LEFT HEART CATH AND CORONARY ANGIOGRAPHY N/A 02/10/2016   Procedure: Left Heart Cath and Coronary Angiography;  Surgeon: Lyn Records, MD;  Location: [facility] INVASIVE CV LAB;  Service: Cardiovascular;  Laterality: N/A;    Current Outpatient Prescriptions  Medication Sig Dispense Refill  . aspirin 81 MG tablet Take 1 tablet (81 mg total) by mouth daily. 30 tablet 6  . atorvastatin (LIPITOR) 80 MG  tablet Take 1 tablet (80 mg total) by mouth daily at 6 PM. 30 tablet 6  . clopidogrel (PLAVIX) 75 MG tablet Take 1 tablet (75 mg total) by mouth daily with breakfast. 30 tablet 6  . lisinopril (PRINIVIL,ZESTRIL) 10 MG tablet Take 1 tablet (10 mg total) by mouth daily. 90 tablet 3  . metoprolol (LOPRESSOR) 50 MG tablet Take 1 tablet (50 mg total) by mouth 2 (two) times daily. 180 tablet 3  . nitroGLYCERIN (NITROSTAT) 0.4 MG SL tablet Place 1 tablet (0.4 mg total) under the tongue every 5 (five) minutes x 3 doses as needed for chest pain. 25 tablet 3   No current facility-administered medications for this visit.     No Known Allergies  Social History   Social History  . Marital status: Married    Spouse name: N/A  . Number of children: N/A  . Years of education: N/A   Occupational History  . Not on file.   Social History Main Topics  . Smoking status: Current Every Day Smoker    Packs/day: 0.20    Years: 15.00    Types: Cigarettes  . Smokeless tobacco: Never Used  . Alcohol use Yes     Comment: 40oz beer  . Drug use: No  . Sexual activity: Yes    Birth control/ protection: None   Other Topics Concern  . Not on file   Social History Narrative  . No narrative on  file    Family History  Problem Relation Age of Onset  . Heart disease Mother     Review of Systems:  As stated in the HPI and otherwise negative.   There were no vitals taken for this visit.  Physical Examination: General: Well developed, well nourished, NAD  HEENT: OP clear, mucus membranes moist  SKIN: warm, dry. No rashes. Neuro: No focal deficits  Musculoskeletal: Muscle strength 5/5 all ext  Psychiatric: Mood and affect normal  Neck: No JVD, no carotid bruits, no thyromegaly, no lymphadenopathy.  Lungs:Clear bilaterally, no wheezes, rhonci, crackles Cardiovascular: Regular rate and rhythm. No murmurs, gallops or rubs. Abdomen:Soft. Bowel sounds present. Non-tender.  Extremities: No lower  extremity edema. Pulses are 2 + in the bilateral DP/PT.  EKG:  EKG {ACTION; IS/IS LKG:40102725}OT:21021397} ordered today. The ekg ordered today demonstrates ***  Recent Labs: 02/02/2016: ALT 14 02/12/2016: Hemoglobin 8.8 02/20/2016: BUN 16; Creatinine, Ser 0.89; Magnesium 1.9; Platelets 565; Potassium 4.5; Sodium 141; TSH 26.810   Lipid Panel    Component Value Date/Time   CHOL 210 (H) 02/09/2016 0221   TRIG 95 02/09/2016 0221   HDL 81 02/09/2016 0221   CHOLHDL 2.6 02/09/2016 0221   VLDL 19 02/09/2016 0221   LDLCALC 110 (H) 02/09/2016 0221     Wt Readings from Last 3 Encounters:  03/06/16 162 lb 6.4 oz (73.7 kg)  02/20/16 159 lb (72.1 kg)  02/13/16 157 lb 9.6 oz (71.5 kg)     Other studies Reviewed: Additional studies/ records that were reviewed today include: ***. Review of the above records demonstrates: ***  Assessment and Plan:   1. CAD without angina: She has undergone stenting of the Circumflex artery with a DES in February 2018. Medical management of the restenotic stented segment of RCA which could not be crossed. The vessel is aneursymal and arises from the left coronary cusp. Will continue ASA, Plavix, statin and beta blocker.   2. PVCs: no recent palpitations. ***  3. Ischemic cardiomyopathy: LVEF=45-50% by echo February 2018. Volume status is ok. Continue beta blocker and Ace-inh.   4. HTN: BP is controlled.   5. HLD: continue statin.   Current medicines are reviewed at length with the patient today.  The patient {ACTIONS; HAS/DOES NOT HAVE:19233} concerns regarding medicines.  The following changes have been made:  {PLAN; NO CHANGE:13088:s}  Labs/ tests ordered today include: *** No orders of the defined types were placed in this encounter.    Disposition:   FU with *** in {gen number 3-66:[id number]}0-10:310397} {TIME; UNITS DAY/WEEK/MONTH:19136}   Signed, Verne Carrowhristopher McAlhany, MD 06/08/2016 6:39 AM    [facility] [address], [place], [place]   [address] Phone: [phone]; Fax: [phone]

## 2016-06-12 ENCOUNTER — Encounter: Payer: Self-pay | Admitting: Cardiovascular Disease

## 2017-08-09 ENCOUNTER — Encounter: Payer: Self-pay | Admitting: Family Medicine

## 2017-08-09 ENCOUNTER — Ambulatory Visit (INDEPENDENT_AMBULATORY_CARE_PROVIDER_SITE_OTHER): Payer: Medicare Other | Admitting: Family Medicine

## 2017-08-09 VITALS — BP 122/80 | HR 77 | Temp 98.4°F | Ht 63.0 in | Wt 183.0 lb

## 2017-08-09 DIAGNOSIS — I2510 Atherosclerotic heart disease of native coronary artery without angina pectoris: Secondary | ICD-10-CM | POA: Diagnosis not present

## 2017-08-09 DIAGNOSIS — Z1211 Encounter for screening for malignant neoplasm of colon: Secondary | ICD-10-CM | POA: Diagnosis not present

## 2017-08-09 DIAGNOSIS — Z1159 Encounter for screening for other viral diseases: Secondary | ICD-10-CM | POA: Diagnosis not present

## 2017-08-09 DIAGNOSIS — Z780 Asymptomatic menopausal state: Secondary | ICD-10-CM

## 2017-08-09 DIAGNOSIS — Z1231 Encounter for screening mammogram for malignant neoplasm of breast: Secondary | ICD-10-CM

## 2017-08-09 DIAGNOSIS — Z0000 Encounter for general adult medical examination without abnormal findings: Secondary | ICD-10-CM | POA: Insufficient documentation

## 2017-08-09 DIAGNOSIS — I10 Essential (primary) hypertension: Secondary | ICD-10-CM

## 2017-08-09 DIAGNOSIS — Z1239 Encounter for other screening for malignant neoplasm of breast: Secondary | ICD-10-CM

## 2017-08-09 DIAGNOSIS — E785 Hyperlipidemia, unspecified: Secondary | ICD-10-CM

## 2017-08-09 LAB — VITAMIN D 25 HYDROXY (VIT D DEFICIENCY, FRACTURES): VITD: 13.56 ng/mL — AB (ref 30.00–100.00)

## 2017-08-09 LAB — LIPID PANEL
CHOL/HDL RATIO: 3
Cholesterol: 205 mg/dL — ABNORMAL HIGH (ref 0–200)
HDL: 76.3 mg/dL (ref >39.00)
LDL CALC: 107 mg/dL — AB (ref 0–99)
NonHDL: 129.03
Triglycerides: 112 mg/dL (ref 0.0–149.0)
VLDL: 22.4 mg/dL (ref 0.0–40.0)

## 2017-08-09 LAB — COMPREHENSIVE METABOLIC PANEL
ALT: 15 U/L (ref 0–35)
AST: 23 U/L (ref 0–37)
Albumin: 4.7 g/dL (ref 3.5–5.2)
Alkaline Phosphatase: 44 U/L (ref 39–117)
BUN: 17 mg/dL (ref 6–23)
CALCIUM: 9.8 mg/dL (ref 8.4–10.5)
CHLORIDE: 100 meq/L (ref 96–112)
CO2: 29 meq/L (ref 19–32)
CREATININE: 0.86 mg/dL (ref 0.40–1.20)
GFR: 85.09 mL/min (ref >60.00)
Glucose, Bld: 118 mg/dL — ABNORMAL HIGH (ref 70–99)
POTASSIUM: 4.4 meq/L (ref 3.5–5.1)
SODIUM: 137 meq/L (ref 135–145)
Total Bilirubin: 0.6 mg/dL (ref 0.2–1.2)
Total Protein: 8.3 g/dL (ref 6.0–8.3)

## 2017-08-09 LAB — CBC
HEMATOCRIT: 35.3 % — AB (ref 36.0–46.0)
HEMOGLOBIN: 11.7 g/dL — AB (ref 12.0–15.0)
MCHC: 33.2 g/dL (ref 30.0–36.0)
MCV: 97.1 fl (ref 78.0–100.0)
PLATELETS: 416 10*3/uL — AB (ref 150.0–400.0)
RBC: 3.64 Mil/uL — AB (ref 3.87–5.11)
RDW: 14.5 % (ref 11.5–15.5)
WBC: 5.6 10*3/uL (ref 4.0–10.5)

## 2017-08-09 LAB — TSH: TSH: 14.55 u[IU]/mL — AB (ref 0.35–4.50)

## 2017-08-09 NOTE — Patient Instructions (Signed)

## 2017-08-09 NOTE — Assessment & Plan Note (Signed)
BP is well controlled at this time. Continue current medications Update labs

## 2017-08-09 NOTE — Assessment & Plan Note (Signed)
Well adult, chronic issues addressed as well.  Orders Placed This Encounter  Procedures  . MM Digital Screening    Standing Status:   Future    Standing Expiration Date:   10/10/2018    Order Specific Question:   Reason for Exam (SYMPTOM  OR DIAGNOSIS REQUIRED)    Answer:   Breast cancer screening    Order Specific Question:   Preferred imaging location?    Answer:   [facility]  . DG Bone Density    Standing Status:   Future    Standing Expiration Date:   10/10/2018    Order Specific Question:   Reason for Exam (SYMPTOM  OR DIAGNOSIS REQUIRED)    Answer:   Screening for osteoporosis    Order Specific Question:   Preferred imaging location?    Answer:   [facility]  . Comp Met (CMET)  . CBC  . Lipid Profile  . TSH  . Vitamin D (25 hydroxy)  . Cologuard  . Hepatitis C Antibody  . Ambulatory referral to Cardiology    Referral Priority:   Routine    Referral Type:   Consultation    Referral Reason:   Specialty Services Required    Requested Specialty:   Cardiology    Number of Visits Requested:   1  Immunizations:  Provided handout regarding prevnar.  She will let me know if she decides to get this.  Screenings:  Hep C screening, cologuard, mammogram and bone density test ordered.  She declines pap, no history of abnormal paps. Anticipatory guidance/Risk factor reduction: Per AVS.

## 2017-08-09 NOTE — Assessment & Plan Note (Signed)
Doing well with atorvastatin, update lipid panel.  ?

## 2017-08-09 NOTE — Assessment & Plan Note (Signed)
She continues on plavix and asa It has been >1 year since her DES was placed, may be able to discontinue plavix as this point I have placed a referral back to cardiology.  She will continue BB and statin.

## 2017-08-09 NOTE — Progress Notes (Signed)
Diamond Hughes - 67 y.o. female MRN [id number]  Date of birth: 1950/04/07  Subjective Chief Complaint  Patient presents with  . Establish Care    est care, CPE fasting.    HPI Diamond Hughes is a 67 y.o. female with history of HTN, HLD, CAD s/p MI with RCA PCI in 2000 and DES to LCX in 2018.  Was being followed by cardiology here but moved to [facility] for a year and was lost to follow up.  Now that she has moved back she would like to re-establish with cardiology.  She continues on plavix, asa, metoprolol and lisinopril.  She denies any anginal symptoms, shortness of breath, palpitations, or edema.   She has several health maintenance items that need to be updated.  She is unsure about pneumonia vaccine.   Review of Systems  Constitutional: Negative for chills, fever, malaise/fatigue and weight loss.  HENT: Negative for congestion, ear pain and sore throat.   Eyes: Negative for blurred vision, double vision and pain.  Respiratory: Negative for cough and shortness of breath.   Cardiovascular: Negative for chest pain and palpitations.  Gastrointestinal: Negative for abdominal pain, blood in stool, constipation, heartburn and nausea.  Genitourinary: Negative for dysuria and urgency.  Musculoskeletal: Negative for joint pain and myalgias.  Neurological: Negative for dizziness and headaches.  Endo/Heme/Allergies: Does not bruise/bleed easily.  Psychiatric/Behavioral: Negative for depression. The patient is not nervous/anxious and does not have insomnia.     No Known Allergies  Past Medical History:  Diagnosis Date  . CAD (coronary artery disease)    a. 2000 s/p MI and RCA PCI [facility]);  b. 02/2016 NSTEMI/PCI: LM nl, LAD nl, LCX 100p (3.5x18 Resolute Onyx DES), OM1/3 small, RCA 70p aneurysmal, 41md ISR-->attempted PCI, could not cross w/ wire-->med rx, EF 45-50%.  . Hyperlipidemia   . Hypertension   . Ischemic cardiomyopathy    a. 02/2016 Echo: EF 45-50%, basal-mid  inferior/posterior HK, Gr1 DD, triv MR, nl LA size, nl RV fxn.  . Normocytic anemia     Past Surgical History:  Procedure Laterality Date  . ANGIOPLASTY  2000  . CORONARY STENT INTERVENTION N/A 02/10/2016   Procedure: Coronary Stent Intervention;  Surgeon: HBelva Crome MD;  [place] [facility];  Service: Cardiovascular;  Laterality: N/A;  . LEFT HEART CATH AND CORONARY ANGIOGRAPHY N/A 02/10/2016   Procedure: Left Heart Cath and Coronary Angiography;  Surgeon: HBelva Crome MD;  [facility] [facility];  Service: Cardiovascular;  Laterality: N/A;    Social History   Socioeconomic History  . Marital status: Married    Spouse name: Not on file  . Number of children: Not on file  . Years of education: Not on file  . Highest education level: Not on file  Occupational History  . Not on file  Social Needs  . Financial resource strain: Not on file  . Food insecurity:    Worry: Not on file    Inability: Not on file  . Transportation needs:    Medical: Not on file    Non-medical: Not on file  Tobacco Use  . Smoking status: Former Smoker    Packs/day: 0.20    Years: 15.00    Pack years: 3.00    Types: Cigarettes  . Smokeless tobacco: Never Used  Substance and Sexual Activity  . Alcohol use: Yes    Comment: 40oz beer  . Drug use: No  . Sexual activity: Yes    Birth  control/protection: None  Lifestyle  . Physical activity:    Days per week: Not on file    Minutes per session: Not on file  . Stress: Not on file  Relationships  . Social connections:    Talks on phone: Not on file    Gets together: Not on file    Attends religious service: Not on file    Active member of club or organization: Not on file    Attends meetings of clubs or organizations: Not on file    Relationship status: Not on file  Other Topics Concern  . Not on file  Social History Narrative  . Not on file    Family History  Problem Relation Age of Onset  . Heart disease Mother   .  Hyperlipidemia Mother   . Hypertension Mother   . Hyperlipidemia Sister   . Hypertension Sister     Health Maintenance  Topic Date Due  . Hepatitis C Screening  09/22/1950  . HIV Screening  05/03/1967  . TETANUS/TDAP  05/03/1971  . PAP SMEAR  05/02/1973  . MAMMOGRAM  05/03/2002  . COLONOSCOPY  05/03/2002  . DEXA SCAN  05/02/2017  . PNA vac Low Risk Adult (1 of 2 - PCV13) 05/02/2017  . INFLUENZA VACCINE  08/05/2017    ----------------------------------------------------------------------------------------------------------------------------------------------------------------------------------------------------------------- Physical Exam BP 122/80 (BP Location: Left Arm, Patient Position: Sitting, Cuff Size: Normal)   Pulse 77   Temp 98.4 F (36.9 C) (Oral)   Ht '5\' 3"'  (1.6 m)   Wt 183 lb (83 kg)   SpO2 97%   BMI 32.42 kg/m   Physical Exam  Constitutional: She is oriented to person, place, and time. She appears well-nourished. No distress.  HENT:  Head: Normocephalic and atraumatic.  Nose: Nose normal.  Mouth/Throat: Oropharynx is clear and moist.  Eyes: Conjunctivae are normal. No scleral icterus.  Neck: Normal range of motion. Neck supple. No thyromegaly present.  Cardiovascular: Normal rate, regular rhythm and normal heart sounds.  Pulmonary/Chest: Effort normal and breath sounds normal.  Abdominal: Soft. Bowel sounds are normal. She exhibits no distension. There is no tenderness. There is no guarding.  Musculoskeletal: Normal range of motion. She exhibits no edema.  Lymphadenopathy:    She has no cervical adenopathy.  Neurological: She is alert and oriented to person, place, and time. No cranial nerve deficit. Coordination normal.  Skin: Skin is warm and dry. No rash noted.  Psychiatric: She has a normal mood and affect. Her behavior is normal.      ------------------------------------------------------------------------------------------------------------------------------------------------------------------------------------------------------------------- Assessment and Plan  HTN (hypertension) BP is well controlled at this time. Continue current medications Update labs  CAD (coronary artery disease) She continues on plavix and asa It has been >1 year since her DES was placed, may be able to discontinue plavix as this point I have placed a referral back to cardiology.  She will continue BB and statin.   Hyperlipidemia Doing well with atorvastatin, update lipid panel.   Well adult exam Well adult, chronic issues addressed as well.  Orders Placed This Encounter  Procedures  . MM Digital Screening    Standing Status:   Future    Standing Expiration Date:   10/10/2018    Order Specific Question:   Reason for Exam (SYMPTOM  OR DIAGNOSIS REQUIRED)    Answer:   Breast cancer screening    Order Specific Question:   Preferred imaging location?    Answer:   [facility]  . DG Bone Density    Standing  Status:   Future    Standing Expiration Date:   10/10/2018    Order Specific Question:   Reason for Exam (SYMPTOM  OR DIAGNOSIS REQUIRED)    Answer:   Screening for osteoporosis    Order Specific Question:   Preferred imaging location?    Answer:   [facility]  . Comp Met (CMET)  . CBC  . Lipid Profile  . TSH  . Vitamin D (25 hydroxy)  . Cologuard  . Hepatitis C Antibody  . Ambulatory referral to Cardiology    Referral Priority:   Routine    Referral Type:   Consultation    Referral Reason:   Specialty Services Required    Requested Specialty:   Cardiology    Number of Visits Requested:   1  Immunizations:  Provided handout regarding prevnar.  She will let me know if she decides to get this.  Screenings:  Hep C screening, cologuard, mammogram and bone density test ordered.  She declines pap, no history of  abnormal paps. Anticipatory guidance/Risk factor reduction: Per AVS.

## 2017-08-10 LAB — HEPATITIS C ANTIBODY
HEP C AB: NONREACTIVE
SIGNAL TO CUT-OFF: 0.04 (ref <1.00)

## 2017-08-11 ENCOUNTER — Other Ambulatory Visit: Payer: Self-pay | Admitting: Family Medicine

## 2017-08-11 ENCOUNTER — Other Ambulatory Visit (INDEPENDENT_AMBULATORY_CARE_PROVIDER_SITE_OTHER): Payer: Medicare Other

## 2017-08-11 DIAGNOSIS — E039 Hypothyroidism, unspecified: Secondary | ICD-10-CM

## 2017-08-11 DIAGNOSIS — R7309 Other abnormal glucose: Secondary | ICD-10-CM

## 2017-08-11 DIAGNOSIS — R7989 Other specified abnormal findings of blood chemistry: Secondary | ICD-10-CM | POA: Diagnosis not present

## 2017-08-11 LAB — T4, FREE: FREE T4: 0.26 ng/dL — AB (ref 0.60–1.60)

## 2017-08-11 MED ORDER — LEVOTHYROXINE SODIUM 50 MCG PO TABS: 50.0000 ug | ORAL_TABLET | Freq: Every day | ORAL | 3 refills | Status: DC

## 2017-08-11 NOTE — Progress Notes (Signed)
-  TSH elevated with low T4 indicating she has hypothyroidism.  I have sent in a medication for her to start for this.  We'll recheck levels in about 6-8 weeks.  -Vitamin D levels are low.  I would recommend that she take an OTC vitamin d supplement 2000 IU daily -Blood sugar is a little elevated as well.  She should follow a low carbohydrate diet with regular exercise/activity

## 2017-08-11 NOTE — Progress Notes (Signed)
Please have free T4 added to recent labs

## 2017-08-11 NOTE — Progress Notes (Signed)
Free

## 2017-08-12 ENCOUNTER — Other Ambulatory Visit: Payer: Self-pay | Admitting: Family Medicine

## 2017-08-12 DIAGNOSIS — Z1239 Encounter for other screening for malignant neoplasm of breast: Secondary | ICD-10-CM

## 2017-08-12 DIAGNOSIS — E2839 Other primary ovarian failure: Secondary | ICD-10-CM

## 2017-09-01 ENCOUNTER — Encounter: Payer: Self-pay | Admitting: Cardiology

## 2017-09-02 ENCOUNTER — Telehealth: Payer: Self-pay | Admitting: Family Medicine

## 2017-09-02 DIAGNOSIS — I10 Essential (primary) hypertension: Secondary | ICD-10-CM

## 2017-09-02 MED ORDER — METOPROLOL TARTRATE 50 MG PO TABS: 50.0000 mg | ORAL_TABLET | Freq: Two times a day (BID) | ORAL | 1 refills | Status: DC

## 2017-09-02 MED ORDER — LISINOPRIL 10 MG PO TABS: 10.0000 mg | ORAL_TABLET | Freq: Every day | ORAL | 1 refills | Status: DC

## 2017-09-02 NOTE — Telephone Encounter (Signed)
Copied from CRM [id number]. Topic: Quick Communication - Rx Refill/Question >> Sep 02, 2017  3:05 PM Oneal GroutSebastian, Jennifer S wrote: Medication: lisinopril (PRINIVIL,ZESTRIL) 10 MG tablet, metoprolol tartrate (LOPRESSOR) 50 MG tablet   Has the patient contacted their pharmacy? Yes.   (Agent: If no, request that the patient contact the pharmacy for the refill.) (Agent: If yes, when and what did the pharmacy advise?)  Preferred Pharmacy (with phone number or street name): [facility] on [address]  Agent: Please be advised that RX refills may take up to 3 business days. We ask that you follow-up with your pharmacy.  Patient out of lisinopril, requesting call once sent

## 2017-09-14 ENCOUNTER — Ambulatory Visit: Payer: Medicare Other

## 2017-09-17 ENCOUNTER — Encounter: Payer: Self-pay | Admitting: Physician Assistant

## 2017-09-17 ENCOUNTER — Ambulatory Visit: Payer: Medicare Other | Admitting: Physician Assistant

## 2017-09-17 VITALS — BP 142/88 | HR 84 | Ht 63.0 in | Wt 182.8 lb

## 2017-09-17 DIAGNOSIS — I10 Essential (primary) hypertension: Secondary | ICD-10-CM

## 2017-09-17 DIAGNOSIS — E785 Hyperlipidemia, unspecified: Secondary | ICD-10-CM

## 2017-09-17 DIAGNOSIS — I255 Ischemic cardiomyopathy: Secondary | ICD-10-CM | POA: Diagnosis not present

## 2017-09-17 DIAGNOSIS — I2510 Atherosclerotic heart disease of native coronary artery without angina pectoris: Secondary | ICD-10-CM | POA: Diagnosis not present

## 2017-09-17 MED ORDER — ATORVASTATIN CALCIUM 80 MG PO TABS: 80.0000 mg | ORAL_TABLET | Freq: Every day | ORAL | 3 refills | Status: DC

## 2017-09-17 MED ORDER — CLOPIDOGREL BISULFATE 75 MG PO TABS
75.0000 mg | ORAL_TABLET | Freq: Every day | ORAL | 3 refills | Status: DC
Start: 2017-09-17 — End: 2018-04-13

## 2017-09-17 NOTE — Progress Notes (Signed)
Cardiology Office Note    Date:  09/17/2017   ID:  Diamond Hughes, DOB 1950/07/04, MRN [id number]  PCP:  Luetta Nutting, DO  Cardiologist: Dr. Angelena Form   Chief Complaint: 6 Months follow up  History of Present Illness:   Diamond Hughes is a 67 y.o. female with hx of CAD, ICM, HTN, HLD and microcytic anemia presents for follow up.   History of CAD status post prior MI with stent to the RCA in 2000 in [place]. Admitted 02/2016 with NSTEMI. 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 She had DES placed to the Cx. There was failed angioplasty of the right coronary in-stent restenosis and distal de novo disease due to inability to cross with the balloon. Medical therapy was recommended for RCA lesion. 2-D echo showed an EF of 45-50% with basal to mid inferior posterior hypokinesis and grade 1 diastolic dysfunction. Noted PVCs on prior EKGs. Advised to reduced caffeine and ETOH consumption.   Here today for follow up. Recent LDL 107. Eating lots of cheese.  He goes to gym.  No exertional limitation.  Denies chest pain, shortness of breath, palpitations, dizziness, orthopnea, lower extremity edema or melena.  Compliant with her medication.  Blood pressure was elevated here however runs normal at home.  She was rushed to come to clinic.   Past Medical History:  Diagnosis Date  . CAD (coronary artery disease)    a. 2000 s/p MI and RCA PCI [facility]);  b. 02/2016 NSTEMI/PCI: LM nl, LAD nl, LCX 100p (3.5x18 Resolute Onyx DES), OM1/3 small, RCA 70p aneurysmal, 81md ISR-->attempted PCI, could not cross w/ wire-->med rx, EF 45-50%.  . Hyperlipidemia   . Hypertension   . Ischemic cardiomyopathy    a. 02/2016 Echo: EF 45-50%, basal-mid inferior/posterior HK, Gr1 DD, triv MR, nl LA size, nl RV fxn.  . Normocytic anemia     Past Surgical History:  Procedure  Laterality Date  . ANGIOPLASTY  2000  . CORONARY STENT INTERVENTION N/A 02/10/2016   Procedure: Coronary Stent Intervention;  Surgeon: HBelva Crome MD;  [facility];  Service: Cardiovascular;  Laterality: N/A;  . LEFT HEART CATH AND CORONARY ANGIOGRAPHY N/A 02/10/2016   Procedure: Left Heart Cath and Coronary Angiography;  Surgeon: HBelva Crome MD;  [facility];  Service: Cardiovascular;  Laterality: N/A;    Current Medications: Prior to Admission medications   Medication Sig Start Date End Date Taking? Authorizing Provider  aspirin 81 MG tablet Take 1 tablet (81 mg total) by mouth daily. 02/12/16   BTheora Gianotti NP  atorvastatin (LIPITOR) 80 MG tablet Take 1 tablet (80 mg total) by mouth daily at 6 PM. 02/12/16   BTheora Gianotti NP  clopidogrel (PLAVIX) 75 MG tablet Take 1 tablet (75 mg total) by mouth daily with breakfast. 02/13/16   BTheora Gianotti NP  levothyroxine (SYNTHROID, LEVOTHROID) 50 MCG tablet Take 1 tablet (50 mcg total) by mouth daily. 08/11/17   MLuetta Nutting DO  lisinopril (PRINIVIL,ZESTRIL) 10 MG tablet Take 1 tablet (10 mg total) by mouth daily. 09/02/17   MLuetta Nutting DO  metoprolol tartrate (LOPRESSOR) 50 MG tablet Take 1 tablet (50 mg total) by mouth 2 (two) times daily. 09/02/17   MLuetta Nutting DO  nitroGLYCERIN (NITROSTAT) 0.4 MG SL tablet Place 1 tablet (0.4  mg total) under the tongue every 5 (five) minutes x 3 doses as needed for chest pain. 02/12/16   Theora Gianotti, NP    Allergies:   Patient has no known allergies.   Social History   Socioeconomic History  . Marital status: Married    Spouse name: Not on file  . Number of children: Not on file  . Years of education: Not on file  . Highest education level: Not on file  Occupational History  . Not on file  Social Needs  . Financial resource strain: Not on file  . Food insecurity:    Worry: Not on file    Inability: Not on file  .  Transportation needs:    Medical: Not on file    Non-medical: Not on file  Tobacco Use  . Smoking status: Former Smoker    Packs/day: 0.20    Years: 15.00    Pack years: 3.00    Types: Cigarettes  . Smokeless tobacco: Never Used  Substance and Sexual Activity  . Alcohol use: Yes    Comment: 40oz beer  . Drug use: No  . Sexual activity: Yes    Birth control/protection: None  Lifestyle  . Physical activity:    Days per week: Not on file    Minutes per session: Not on file  . Stress: Not on file  Relationships  . Social connections:    Talks on phone: Not on file    Gets together: Not on file    Attends religious service: Not on file    Active member of club or organization: Not on file    Attends meetings of clubs or organizations: Not on file    Relationship status: Not on file  Other Topics Concern  . Not on file  Social History Narrative  . Not on file     Family History:  The patient's family history includes Heart disease in her mother; Hyperlipidemia in her mother and sister; Hypertension in her mother and sister.   ROS:   Please see the history of present illness.    ROS All other systems reviewed and are negative.   PHYSICAL EXAM:   VS:  BP (!) 142/88   Pulse 84   Ht '5\' 3"'  (1.6 m)   Wt 182 lb 12 oz (82.9 kg)   SpO2 98%   BMI 32.37 kg/m    GEN: Well nourished, well developed, in no acute distress  HEENT: normal  Neck: no JVD, carotid bruits, or masses Cardiac: RRR; no murmurs, rubs, or gallops,no edema  Respiratory:  clear to auscultation bilaterally, normal work of breathing GI: soft, nontender, nondistended, + BS MS: no deformity or atrophy  Skin: warm and dry, no rash Neuro:  Alert and Oriented x 3, Strength and sensation are intact Psych: euthymic mood, full affect  Wt Readings from Last 3 Encounters:  09/17/17 182 lb 12 oz (82.9 kg)  08/09/17 183 lb (83 kg)  03/06/16 162 lb 6.4 oz (73.7 kg)      Studies/Labs Reviewed: No acute   EKG:   EKG is ordered today.  The ekg ordered today demonstrates normal sinus rhythm  Recent Labs: 08/09/2017: ALT 15; BUN 17; Creatinine, Ser 0.86; Hemoglobin 11.7; Platelets 416.0; Potassium 4.4; Sodium 137; TSH 14.55   Lipid Panel    Component Value Date/Time   CHOL 205 (H) 08/09/2017 1357   TRIG 112.0 08/09/2017 1357   HDL 76.30 08/09/2017 1357   CHOLHDL 3 08/09/2017 1357   VLDL  22.4 08/09/2017 1357   LDLCALC 107 (H) 08/09/2017 1357    Additional studies/ records that were reviewed today include:   Echocardiogram: 02/09/17 Study Conclusions  - Left ventricle: The cavity size was normal. Wall thickness was   normal. Systolic function was mildly reduced. The estimated   ejection fraction was in the range of 45% to 50%. Basal to mid   inferior/posterior wall hypokinesis. Doppler parameters are   consistent with abnormal left ventricular relaxation (grade 1   diastolic dysfunction). The E/e&' ratio is between 8-15,   suggesting indeterminate LV filling pressure. - Mitral valve: Mildly thickened leaflets . There was trivial   regurgitation. - Left atrium: The atrium was normal in size. - Right ventricle: The cavity size was normal. Systolic function is   reduced. - Right atrium: The atrium was normal in size. - Inferior vena cava: The vessel was normal in size. The   respirophasic diameter changes were in the normal range (= 50%),   consistent with normal central venous pressure.  Impressions:  - LVEF 45-50%, normal wall thickness, basal to mid   inferior/posterior hypokinesis, grade 1 DD, indeterminate LV   filling pressure, normal LA size, normal IVC.  Coronary Stent Intervention   02/10/16  Left Heart Cath and Coronary Angiography  Conclusion    Non-ST elevation myocardial infarction with ongoing chest discomfort upon being placed on the cath table.  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 Severe diffuse in-stent restenosis involving the  right coronary throughout the entire stented mid segment. There is also high-grade obstruction greater than 95% distal to the stent. The proximal RCA contains diffuse 50-70% stenosis with a large saccular aneurysm.  99% mid circumflex with TIMI grade 1 flow.  Widely patent LAD and large diagonal  Normal left main.  Normal LV size, with severe inferior wall hypokinesis. EF 45-50%.  Successful angioplasty and stenting of the mid circumflex from 99% with TIMI grade one flow to 0% with TIMI grade 3 flow. A Resolute Onyx 18 x 3.5 mm DES was placed and dilated to 15 atm using the stent system.  Failed angioplasty of the right coronary in-stent restenosis and distal de novo disease due to inability to cross with the balloon.  RECOMMENDATIONS:  The acute lesion is a circumflex which was treated successfully with DES as noted above.  Aspirin and Plavix for least 12 months or longer.  Whether or not intervention should be attempted on the right coronary will depend upon clinical course with the circumflex coronary artery patent. If no angina, medical therapy would be most reasonable. If continued angina, in several weeks or earlier femoral catheterization with attempted rotational atherectomy/CSI and re-stenting.  Potentially able to be discharged in a.m. if able ambulate without chest discomfort. If chest discomfort continues to be an issue, right coronary intervention will need to be dealt with sooner rather than later.      ASSESSMENT & PLAN:    1. CAD  - No angina. Continue ASA, Plavix as she does not have bleeding issue. Continue statin and BB/   2. Mild LV dysfunction - Noted by Echo 02/2016 during PCI. Euvolemic. No CHF symptoms. Continue BB and ACE.   3. HLD -08/09/2017: Cholesterol 205; HDL 76.30; LDL Cholesterol 107; Triglycerides 112.0; VLDL 22.4  - Continue statin. Diet education given.  4. HTN - continue current medications.     Medication Adjustments/Labs and Tests  Ordered: Current medicines are reviewed at length with the patient today.  Concerns regarding medicines are outlined above.  Medication changes, Labs and Tests ordered today are listed in the Patient Instructions below. Patient Instructions  Medication Instructions:  Your physician recommends that you continue on your current medications as directed. Please refer to the Current Medication list given to you today.   Labwork: None ordered  Testing/Procedures: None ordered  Follow-Up: Your physician wants you to follow-up in: 6 MONTHS with Dr. Angelena Form. You will receive a reminder letter in the mail two months in advance. If you don't receive a letter, please call our office to schedule the follow-up appointment.   Any Other Special Instructions Will Be Listed Below (If Applicable).     If you need a refill on your cardiac medications before your next appointment, please call your pharmacy.      Jarrett Soho, [place]  09/17/2017 3:41 PM    [facility] [address], [place], [place]  [address] Phone: [phone]; Fax: [phone]

## 2017-09-17 NOTE — Patient Instructions (Addendum)
Medication Instructions:  Your physician recommends that you continue on your current medications as directed. Please refer to the Current Medication list given to you today.   Labwork: None ordered  Testing/Procedures: None ordered  Follow-Up: Your physician wants you to follow-up in: 6 MONTHS with Dr. Clifton JamesMCALHANY. You will receive a reminder letter in the mail two months in advance. If you don't receive a letter, please call our office to schedule the follow-up appointment.   Any Other Special Instructions Will Be Listed Below (If Applicable).     If you need a refill on your cardiac medications before your next appointment, please call your pharmacy.

## 2017-09-22 ENCOUNTER — Ambulatory Visit: Payer: Medicare Other | Admitting: Cardiology

## 2017-10-07 ENCOUNTER — Inpatient Hospital Stay: Admission: RE | Admit: 2017-10-07 | Payer: Medicare Other | Source: Ambulatory Visit

## 2017-11-01 ENCOUNTER — Encounter: Payer: Self-pay | Admitting: Family Medicine

## 2017-11-01 DIAGNOSIS — Z1211 Encounter for screening for malignant neoplasm of colon: Secondary | ICD-10-CM | POA: Diagnosis not present

## 2017-11-01 DIAGNOSIS — Z1212 Encounter for screening for malignant neoplasm of rectum: Secondary | ICD-10-CM | POA: Diagnosis not present

## 2017-11-01 LAB — COLOGUARD

## 2017-11-05 LAB — COLOGUARD: COLOGUARD: NEGATIVE

## 2017-11-11 ENCOUNTER — Encounter: Payer: Self-pay | Admitting: Family Medicine

## 2017-11-12 ENCOUNTER — Ambulatory Visit
Admission: RE | Admit: 2017-11-12 | Discharge: 2017-11-12 | Disposition: A | Discharge status: Home / Self Care | Payer: Medicare Other | Source: Ambulatory Visit | Attending: Family Medicine | Admitting: Family Medicine

## 2017-11-12 DIAGNOSIS — Z1231 Encounter for screening mammogram for malignant neoplasm of breast: Secondary | ICD-10-CM | POA: Diagnosis not present

## 2017-11-12 DIAGNOSIS — Z1239 Encounter for other screening for malignant neoplasm of breast: Secondary | ICD-10-CM

## 2017-11-16 ENCOUNTER — Other Ambulatory Visit: Payer: Self-pay | Admitting: Family Medicine

## 2017-11-16 DIAGNOSIS — R928 Other abnormal and inconclusive findings on diagnostic imaging of breast: Secondary | ICD-10-CM

## 2017-11-19 ENCOUNTER — Ambulatory Visit
Admission: RE | Admit: 2017-11-19 | Discharge: 2017-11-19 | Disposition: A | Discharge status: Home / Self Care | Payer: Medicare Other | Source: Ambulatory Visit | Attending: Family Medicine | Admitting: Family Medicine

## 2017-11-19 ENCOUNTER — Other Ambulatory Visit: Payer: Self-pay | Admitting: Family Medicine

## 2017-11-19 DIAGNOSIS — R921 Mammographic calcification found on diagnostic imaging of breast: Secondary | ICD-10-CM | POA: Diagnosis not present

## 2017-11-19 DIAGNOSIS — R928 Other abnormal and inconclusive findings on diagnostic imaging of breast: Secondary | ICD-10-CM

## 2017-11-25 ENCOUNTER — Ambulatory Visit: Payer: Self-pay

## 2017-11-25 NOTE — Telephone Encounter (Signed)
Returned call to patient who states she wants to go off her synthroid.  She believes that it is making her gain weight especially around her waist.  Pt denies bloating and constipation.  She states that she has just moves to the area.  She has joined a gym. She lives on a 3rd floor walk up. She has had some swelling to her ankles but is not currently experiencing swelling. Pt was advised to continue her synthroid. Appointment scheduled per protocol. Pt needs to review medications and discuss weight gain with PCP. Pt agree to continue synthroid until advised by Dr Ashley RoyaltyMatthews. Pt verbalized understanding of all information. Reason for Disposition . [1] MILD swelling of both ankles (i.e., pedal edema) AND [2] new onset or worsening  Answer Assessment - Initial Assessment Questions 1. ONSET: "When did the swelling start?" (e.g., minutes, hours, days)     Ankle swelling 2 months ago 2. LOCATION: "What part of the leg is swollen?"  "Are both legs swollen or just one leg?"     ankles 3. SEVERITY: "How bad is the swelling?" (e.g., localized; mild, moderate, severe)  - Localized - small area of swelling localized to one leg  - MILD pedal edema - swelling limited to foot and ankle, pitting edema < 1/4 inch (6 mm) deep, rest and elevation eliminate most or all swelling  - MODERATE edema - swelling of lower leg to knee, pitting edema > 1/4 inch (6 mm) deep, rest and elevation only partially reduce swelling  - SEVERE edema - swelling extends above knee, facial or hand swelling present      No swelling now 4. REDNESS: "Does the swelling look red or infected?"     no 5. PAIN: "Is the swelling painful to touch?" If so, ask: "How painful is it?"   (Scale 1-10; mild, moderate or severe)     no 6. FEVER: "Do you have a fever?" If so, ask: "What is it, how was it measured, and when did it start?"      no 7. CAUSE: "What do you think is causing the leg swelling?"    Inactivity? Not sure 8. MEDICAL HISTORY: "Do you  have a history of heart failure, kidney disease, liver failure, or cancer?"     no 9. RECURRENT SYMPTOM: "Have you had leg swelling before?" If so, ask: "When was the last time?" "What happened that time?"     no 10. OTHER SYMPTOMS: "Do you have any other symptoms?" (e.g., chest pain, difficulty breathing)       Weight gain 11. PREGNANCY: "Is there any chance you are pregnant?" "When was your last menstrual period?"       NA  Protocols used: LEG SWELLING AND EDEMA-A-AH

## 2017-11-26 ENCOUNTER — Ambulatory Visit: Payer: Medicare Other | Admitting: Family Medicine

## 2017-11-29 ENCOUNTER — Ambulatory Visit: Payer: Medicare Other | Admitting: Family Medicine

## 2017-12-06 ENCOUNTER — Encounter: Payer: Self-pay | Admitting: Family Medicine

## 2017-12-06 ENCOUNTER — Ambulatory Visit (INDEPENDENT_AMBULATORY_CARE_PROVIDER_SITE_OTHER): Payer: Medicare Other | Admitting: Family Medicine

## 2017-12-06 DIAGNOSIS — I10 Essential (primary) hypertension: Secondary | ICD-10-CM | POA: Diagnosis not present

## 2017-12-06 DIAGNOSIS — R7309 Other abnormal glucose: Secondary | ICD-10-CM | POA: Diagnosis not present

## 2017-12-06 DIAGNOSIS — E039 Hypothyroidism, unspecified: Secondary | ICD-10-CM | POA: Diagnosis not present

## 2017-12-06 LAB — HEMOGLOBIN A1C: HEMOGLOBIN A1C: 6.4 % (ref 4.6–6.5)

## 2017-12-06 LAB — TSH: TSH: 6.54 u[IU]/mL — ABNORMAL HIGH (ref 0.35–4.50)

## 2017-12-06 MED ORDER — LISINOPRIL 20 MG PO TABS: 20.0000 mg | ORAL_TABLET | Freq: Every day | ORAL | 2 refills | Status: DC

## 2017-12-06 NOTE — Assessment & Plan Note (Signed)
-  BP elevated today, increase lisinopril to 20mg  -Follow low salt diet -Monitor BP at home.

## 2017-12-06 NOTE — Patient Instructions (Signed)
Great to see you! We'll be in touch with lab results Increase lisinopril to 20mg  Consider flu shot- you can have this done at anytime.  See me again in 3 months

## 2017-12-06 NOTE — Assessment & Plan Note (Signed)
-  Feels like she is having some weight gain, update TSH and adjust levothyroxine if needed.

## 2017-12-06 NOTE — Progress Notes (Signed)
Diamond Hughes - 67 y.o. female MRN [id number]  Date of birth: 1950-01-13  Subjective Chief Complaint  Patient presents with  . Follow-up    HPI Diamond [name] Hughes is a 67 y.o. female with history of CAD, HTN and hypothyroidism here today for f/u of hypothyroidism.   -Hypothyroidism:  Recently dx hypothyroidism.  Currently taking levothyroxine 50mcg.  Doing well with current dose however feels like she is gaining weight.  TSH has not been rechecked since starting levothyroxine.  Denies symptoms of hyperthyroidism.   -HTN:  BP elevated today and has had some elevated readings at home.  Current tx with lisinopril 10mg  and metoprolol 50mg  BID.  No side effects from current medications.  She denies a high salt diet.  She is going to the gym a few days per week with her husband for exercise.  She denies anginal symptoms, headache, vision changes or shortness of breath.   ROS:  A comprehensive ROS was completed and negative except as noted per HPI  No Known Allergies  Past Medical History:  Diagnosis Date  . CAD (coronary artery disease)    a. 2000 s/p MI and RCA PCI [facility]([place]);  b. 02/2016 NSTEMI/PCI: LM nl, LAD nl, LCX 100p (3.5x18 Resolute Onyx DES), OM1/3 small, RCA 70p aneurysmal, 659m/d ISR-->attempted PCI, could not cross w/ wire-->med rx, EF 45-50%.  . Hyperlipidemia   . Hypertension   . Ischemic cardiomyopathy    a. 02/2016 Echo: EF 45-50%, basal-mid inferior/posterior HK, Gr1 DD, triv MR, nl LA size, nl RV fxn.  . Normocytic anemia     Past Surgical History:  Procedure Laterality Date  . ANGIOPLASTY  2000  . CORONARY STENT INTERVENTION N/A 02/10/2016   Procedure: Coronary Stent Intervention;  Surgeon: Lyn RecordsHenry W Smith, MD;  [facility];  Service: Cardiovascular;  Laterality: N/A;  . LEFT HEART CATH AND CORONARY ANGIOGRAPHY N/A 02/10/2016   Procedure: Left Heart Cath and Coronary Angiography;  Surgeon: Lyn RecordsHenry W Smith, MD;  [facility];  Service:  Cardiovascular;  Laterality: N/A;    Social History   Socioeconomic History  . Marital status: Married    Spouse name: Not on file  . Number of children: Not on file  . Years of education: Not on file  . Highest education level: Not on file  Occupational History  . Not on file  Social Needs  . Financial resource strain: Not on file  . Food insecurity:    Worry: Not on file    Inability: Not on file  . Transportation needs:    Medical: Not on file    Non-medical: Not on file  Tobacco Use  . Smoking status: Former Smoker    Packs/day: 0.20    Years: 15.00    Pack years: 3.00    Types: Cigarettes  . Smokeless tobacco: Never Used  Substance and Sexual Activity  . Alcohol use: Yes    Comment: 40oz beer  . Drug use: No  . Sexual activity: Yes    Birth control/protection: None  Lifestyle  . Physical activity:    Days per week: Not on file    Minutes per session: Not on file  . Stress: Not on file  Relationships  . Social connections:    Talks on phone: Not on file    Gets together: Not on file    Attends religious service: Not on file    Active member of club or organization: Not on file  Attends meetings of clubs or organizations: Not on file    Relationship status: Not on file  Other Topics Concern  . Not on file  Social History Narrative  . Not on file    Family History  Problem Relation Age of Onset  . Heart disease Mother   . Hyperlipidemia Mother   . Hypertension Mother   . Hyperlipidemia Sister   . Hypertension Sister   . Breast cancer Maternal Grandmother     Health Maintenance  Topic Date Due  . HIV Screening  05/03/1967  . TETANUS/TDAP  05/03/1971  . PAP SMEAR  05/02/1973  . COLONOSCOPY  05/03/2002  . DEXA SCAN  05/02/2017  . PNA vac Low Risk Adult (1 of 2 - PCV13) 05/02/2017  . INFLUENZA VACCINE  08/05/2017  . MAMMOGRAM  11/20/2019  . Hepatitis C Screening  Completed     ----------------------------------------------------------------------------------------------------------------------------------------------------------------------------------------------------------------- Physical Exam BP (!) 140/98   Pulse 86   Ht 5\' 3"  (1.6 m)   Wt 185 lb (83.9 kg)   SpO2 97%   BMI 32.77 kg/m   Physical Exam  Constitutional: She is oriented to person, place, and time. She appears well-nourished. No distress.  HENT:  Head: Normocephalic and atraumatic.  Mouth/Throat: Oropharynx is clear and moist.  Eyes: No scleral icterus.  Neck: Neck supple. No thyromegaly present.  Cardiovascular: Normal rate, regular rhythm and normal heart sounds.  Pulmonary/Chest: Effort normal and breath sounds normal.  Lymphadenopathy:    She has no cervical adenopathy.  Neurological: She is alert and oriented to person, place, and time.  Skin: Skin is warm and dry.  Psychiatric: She has a normal mood and affect. Her behavior is normal.    ------------------------------------------------------------------------------------------------------------------------------------------------------------------------------------------------------------------- Assessment and Plan  HTN (hypertension) -BP elevated today, increase lisinopril to 20mg  -Follow low salt diet -Monitor BP at home.   Acquired hypothyroidism -Feels like she is having some weight gain, update TSH and adjust levothyroxine if needed.     Return in about 3 months (around 03/07/2018) for HTN/Hypothyroidism.

## 2017-12-07 ENCOUNTER — Other Ambulatory Visit: Payer: Self-pay | Admitting: Family Medicine

## 2017-12-07 DIAGNOSIS — E039 Hypothyroidism, unspecified: Secondary | ICD-10-CM

## 2017-12-07 MED ORDER — LEVOTHYROXINE SODIUM 75 MCG PO TABS: 75.0000 ug | ORAL_TABLET | Freq: Every day | ORAL | 2 refills | Status: DC

## 2017-12-07 NOTE — Progress Notes (Signed)
-  A1c is a little higher than before.  Follow low carb diet with regular exercise.  -TSH remains elevated, I am going to increase her levothyroxine.  This could be contributing to her weight gain. She should come back in 6-8 weeks for lab visit to have levels rechecked.

## 2018-03-18 ENCOUNTER — Encounter: Payer: Self-pay | Admitting: Family Medicine

## 2018-03-18 ENCOUNTER — Ambulatory Visit (INDEPENDENT_AMBULATORY_CARE_PROVIDER_SITE_OTHER): Payer: Medicare Other | Admitting: Family Medicine

## 2018-03-18 ENCOUNTER — Other Ambulatory Visit: Payer: Self-pay

## 2018-03-18 VITALS — BP 194/106 | HR 79 | Temp 98.2°F | Ht 63.0 in | Wt 185.2 lb

## 2018-03-18 DIAGNOSIS — I10 Essential (primary) hypertension: Secondary | ICD-10-CM

## 2018-03-18 DIAGNOSIS — E039 Hypothyroidism, unspecified: Secondary | ICD-10-CM | POA: Diagnosis not present

## 2018-03-18 LAB — TSH: TSH: 3.84 u[IU]/mL (ref 0.35–4.50)

## 2018-03-18 MED ORDER — METOPROLOL TARTRATE 50 MG PO TABS: 50.0000 mg | ORAL_TABLET | Freq: Two times a day (BID) | ORAL | 1 refills | Status: DC

## 2018-03-18 MED ORDER — AMLODIPINE BESYLATE 5 MG PO TABS: 5.0000 mg | ORAL_TABLET | Freq: Every day | ORAL | 0 refills | Status: DC

## 2018-03-18 NOTE — Assessment & Plan Note (Signed)
-  BP elevated today -We had a long discussion regarding dietary changes and eliminating processed meats as they are high in sodium -Will add amlodipine -Continue lisinopril and metoprolol Return in about 2 weeks (around 04/01/2018) for HTN.

## 2018-03-18 NOTE — Assessment & Plan Note (Signed)
Update TSH today. 

## 2018-03-18 NOTE — Patient Instructions (Signed)
Start amlodipine for blood pressure Continue metoprolol and lisinopril Follow up in 2-3 weeks for BP Work on [employer].    DASH Eating Plan DASH stands for "Dietary Approaches to Stop Hypertension." The DASH eating plan is a healthy eating plan that has been shown to reduce high blood pressure (hypertension). It may also reduce your risk for type 2 diabetes, heart disease, and stroke. The DASH eating plan may also help with weight loss. What are tips for following this plan?  General guidelines  Avoid eating more than 2,300 mg (milligrams) of salt (sodium) a day. If you have hypertension, you may need to reduce your sodium intake to 1,500 mg a day.  Limit alcohol intake to no more than 1 drink a day for nonpregnant women and 2 drinks a day for men. One drink equals 12 oz of beer, 5 oz of wine, or 1 oz of hard liquor.  Work with your health care provider to maintain a healthy body weight or to lose weight. Ask what an ideal weight is for you.  Get at least 30 minutes of exercise that causes your heart to beat faster (aerobic exercise) most days of the week. Activities may include walking, swimming, or biking.  Work with your health care provider or diet and nutrition specialist (dietitian) to adjust your eating plan to your individual calorie needs. Reading food labels   Check food labels for the amount of sodium per serving. Choose foods with less than 5 percent of the Daily Value of sodium. Generally, foods with less than 300 mg of sodium per serving fit into this eating plan.  To find whole grains, look for the word "whole" as the first word in the ingredient list. Shopping  Buy products labeled as "low-sodium" or "no salt added."  Buy fresh foods. Avoid canned foods and premade or frozen meals. Cooking  Avoid adding salt when cooking. Use salt-free seasonings or herbs instead of table salt or sea salt. Check with your health care provider or pharmacist before using salt  substitutes.  Do not fry foods. Cook foods using healthy methods such as baking, boiling, grilling, and broiling instead.  Cook with heart-healthy oils, such as olive, canola, soybean, or sunflower oil. Meal planning  Eat a balanced diet that includes: ? 5 or more servings of fruits and vegetables each day. At each meal, try to fill half of your plate with fruits and vegetables. ? Up to 6-8 servings of whole grains each day. ? Less than 6 oz of lean meat, poultry, or fish each day. A 3-oz serving of meat is about the same size as a deck of cards. One egg equals 1 oz. ? 2 servings of low-fat dairy each day. ? A serving of nuts, seeds, or beans 5 times each week. ? Heart-healthy fats. Healthy fats called Omega-3 fatty acids are found in foods such as flaxseeds and coldwater fish, like sardines, salmon, and mackerel.  Limit how much you eat of the following: ? Canned or prepackaged foods. ? Food that is high in trans fat, such as fried foods. ? Food that is high in saturated fat, such as fatty meat. ? Sweets, desserts, sugary drinks, and other foods with added sugar. ? Full-fat dairy products.  Do not salt foods before eating.  Try to eat at least 2 vegetarian meals each week.  Eat more home-cooked food and less restaurant, buffet, and fast food.  When eating at a restaurant, ask that your food be prepared with less salt  or no salt, if possible. What foods are recommended? The items listed may not be a complete list. Talk with your dietitian about what dietary choices are best for you. Grains Whole-grain or whole-wheat bread. Whole-grain or whole-wheat pasta. Brown rice. [place]. Bulgur. Whole-grain and low-sodium cereals. Pita bread. Low-fat, low-sodium crackers. Whole-wheat flour tortillas. Vegetables Fresh or frozen vegetables (raw, steamed, roasted, or grilled). Low-sodium or reduced-sodium tomato and vegetable juice. Low-sodium or reduced-sodium tomato sauce and tomato  paste. Low-sodium or reduced-sodium canned vegetables. Fruits All fresh, dried, or frozen fruit. Canned fruit in natural juice (without added sugar). Meat and other protein foods Skinless chicken or [place]. Ground chicken or [place]. Pork with fat trimmed off. Fish and seafood. Egg whites. Dried beans, peas, or lentils. Unsalted nuts, nut butters, and seeds. Unsalted canned beans. Lean cuts of beef with fat trimmed off. Low-sodium, lean deli meat. Dairy Low-fat (1%) or fat-free (skim) milk. Fat-free, low-fat, or reduced-fat cheeses. Nonfat, low-sodium ricotta or cottage cheese. Low-fat or nonfat yogurt. Low-fat, low-sodium cheese. Fats and oils Soft margarine without trans fats. Vegetable oil. Low-fat, reduced-fat, or light mayonnaise and salad dressings (reduced-sodium). Canola, safflower, olive, soybean, and sunflower oils. Avocado. Seasoning and other foods Herbs. Spices. Seasoning mixes without salt. Unsalted popcorn and pretzels. Fat-free sweets. What foods are not recommended? The items listed may not be a complete list. Talk with your dietitian about what dietary choices are best for you. Grains Baked goods made with fat, such as croissants, muffins, or some breads. Dry pasta or rice meal packs. Vegetables Creamed or fried vegetables. Vegetables in a cheese sauce. Regular canned vegetables (not low-sodium or reduced-sodium). Regular canned tomato sauce and paste (not low-sodium or reduced-sodium). Regular tomato and vegetable juice (not low-sodium or reduced-sodium). Angie Fava. Olives. Fruits Canned fruit in a light or heavy syrup. Fried fruit. Fruit in cream or butter sauce. Meat and other protein foods Fatty cuts of meat. Ribs. Fried meat. Berniece Salines. Sausage. Bologna and other processed lunch meats. Salami. Fatback. Hotdogs. Bratwurst. Salted nuts and seeds. Canned beans with added salt. Canned or smoked fish. Whole eggs or egg yolks. Chicken or [place] with skin. Dairy Whole or 2% milk,  cream, and half-and-half. Whole or full-fat cream cheese. Whole-fat or sweetened yogurt. Full-fat cheese. Nondairy creamers. Whipped toppings. Processed cheese and cheese spreads. Fats and oils Butter. Stick margarine. Lard. Shortening. Ghee. Bacon fat. Tropical oils, such as coconut, palm kernel, or palm oil. Seasoning and other foods Salted popcorn and pretzels. Onion salt, garlic salt, seasoned salt, table salt, and sea salt. Worcestershire sauce. Tartar sauce. Barbecue sauce. Teriyaki sauce. Soy sauce, including reduced-sodium. Steak sauce. Canned and packaged gravies. Fish sauce. Oyster sauce. Cocktail sauce. Horseradish that you find on the shelf. Ketchup. Mustard. Meat flavorings and tenderizers. Bouillon cubes. Hot sauce and Tabasco sauce. Premade or packaged marinades. Premade or packaged taco seasonings. Relishes. Regular salad dressings. Where to find more information:  National Heart, Lung, and [place]: https://wilson-eaton.com/  American Heart Association: www.heart.org Summary  The DASH eating plan is a healthy eating plan that has been shown to reduce high blood pressure (hypertension). It may also reduce your risk for type 2 diabetes, heart disease, and stroke.  With the DASH eating plan, you should limit salt (sodium) intake to 2,300 mg a day. If you have hypertension, you may need to reduce your sodium intake to 1,500 mg a day.  When on the DASH eating plan, aim to eat more fresh fruits and vegetables, whole grains, lean proteins, low-fat dairy,  and heart-healthy fats.  Work with your health care provider or diet and nutrition specialist (dietitian) to adjust your eating plan to your individual calorie needs. This information is not intended to replace advice given to you by your health care provider. Make sure you discuss any questions you have with your health care provider. Document Released: 12/11/2010 Document Revised: 12/16/2015 Document Reviewed: 12/16/2015 Elsevier  Interactive Patient Education  2019 [employer].

## 2018-03-18 NOTE — Progress Notes (Signed)
Diamond Hughes - 68 y.o. female MRN [id number]  Date of birth: 03-Feb-1950  Subjective Chief Complaint  Patient presents with  . Follow-up    TSH, HTN, Hyperlipid ?     HPI Diamond Hughes is a 68 y.o. female here today for follow up of HTN and hypothyroidism.    -HTN:  BP elevated today.  She reports taking her medications as directed.  She reports feeling a little bit dizzy but denies anginal symptoms, shortness of breath, headache, vision changes or palpitations.  She admits to eating A LOT of processed lunch meats.  May eat a large amount up to three times per day.  She will make dinner for her husband and still eat processed lunch meats rather than what she fixes him.   -Hypothyroidism:  She recently increased levothyroxine.  Doing well with current dose, denies symptoms of hypo/hyperthyroidism.  ROS:  A comprehensive ROS was completed and negative except as noted per HPI     No Known Allergies  Past Medical History:  Diagnosis Date  . CAD (coronary artery disease)    a. 2000 s/p MI and RCA PCI [facility]);  b. 02/2016 NSTEMI/PCI: LM nl, LAD nl, LCX 100p (3.5x18 Resolute Onyx DES), OM1/3 small, RCA 70p aneurysmal, 88m/d ISR-->attempted PCI, could not cross w/ wire-->med rx, EF 45-50%.  . Hyperlipidemia   . Hypertension   . Ischemic cardiomyopathy    a. 02/2016 Echo: EF 45-50%, basal-mid inferior/posterior HK, Gr1 DD, triv MR, nl LA size, nl RV fxn.  . Normocytic anemia     Past Surgical History:  Procedure Laterality Date  . ANGIOPLASTY  2000  . CORONARY STENT INTERVENTION N/A 02/10/2016   Procedure: Coronary Stent Intervention;  Surgeon: Lyn Records, MD;  Location: [facility] INVASIVE CV LAB;  Service: Cardiovascular;  Laterality: N/A;  . LEFT HEART CATH AND CORONARY ANGIOGRAPHY N/A 02/10/2016   Procedure: Left Heart Cath and Coronary Angiography;  Surgeon: Lyn Records, MD;  Location[facility]VAS[facility] CV LAB;  Service: Cardiovascular;  Laterality: N/A;    Social History    Socioeconomic History  . Marital status: Married    Spouse name: Not on file  . Number of children: Not on file  . Years of education: Not on file  . Highest education level: Not on file  Occupational History  . Not on file  Social Needs  . Financial resource strain: Not on file  . Food insecurity:    Worry: Not on file    Inability: Not on file  . Transportation needs:    Medical: Not on file    Non-medical: Not on file  Tobacco Use  . Smoking status: Former Smoker    Packs/day: 0.20    Years: 15.00    Pack years: 3.00    Types: Cigarettes  . Smokeless tobacco: Never Used  Substance and Sexual Activity  . Alcohol use: Yes    Comment: 40oz beer  . Drug use: No  . Sexual activity: Yes    Birth control/protection: None  Lifestyle  . Physical activity:    Days per week: Not on file    Minutes per session: Not on file  . Stress: Not on file  Relationships  . Social connections:    Talks on phone: Not on file    Gets together: Not on file    Attends religious service: Not on file    Active member of club or organization: Not on file    Attends meetings of  clubs or organizations: Not on file    Relationship status: Not on file  Other Topics Concern  . Not on file  Social History Narrative  . Not on file    Family History  Problem Relation Age of Onset  . Heart disease Mother   . Hyperlipidemia Mother   . Hypertension Mother   . Hyperlipidemia Sister   . Hypertension Sister   . Breast cancer Maternal Grandmother     Health Maintenance  Topic Date Due  . HIV Screening  05/03/1967  . TETANUS/TDAP  05/03/1971  . PAP SMEAR-Modifier  05/02/1973  . COLONOSCOPY  05/03/2002  . DEXA SCAN  05/02/2017  . PNA vac Low Risk Adult (1 of 2 - PCV13) 05/02/2017  . INFLUENZA VACCINE  08/05/2017  . MAMMOGRAM  11/20/2019  . Hepatitis C Screening  Completed     ----------------------------------------------------------------------------------------------------------------------------------------------------------------------------------------------------------------- Physical Exam BP (!) 194/106 (BP Location: Left Arm, Patient Position: Sitting, Cuff Size: Normal)   Pulse 79   Temp 98.2 F (36.8 C) (Oral)   Ht 5\' 3"  (1.6 m)   Wt 185 lb 3.2 oz (84 kg)   SpO2 97%   BMI 32.81 kg/m   Physical Exam Constitutional:      Appearance: Normal appearance.  HENT:     Head: Normocephalic and atraumatic.     Left Ear: Tympanic membrane normal.     Mouth/Throat:     Mouth: Mucous membranes are moist.  Eyes:     General: No scleral icterus. Neck:     Musculoskeletal: Neck supple.  Cardiovascular:     Rate and Rhythm: Normal rate and regular rhythm.     Heart sounds: Normal heart sounds.  Pulmonary:     Effort: Pulmonary effort is normal.     Breath sounds: Normal breath sounds.  Skin:    General: Skin is warm and dry.  Neurological:     General: No focal deficit present.     Mental Status: She is alert.  Psychiatric:        Mood and Affect: Mood normal.        Behavior: Behavior normal.     ------------------------------------------------------------------------------------------------------------------------------------------------------------------------------------------------------------------- Assessment and Plan  HTN (hypertension) -BP elevated today -We had a long discussion regarding dietary changes and eliminating processed meats as they are high in sodium -Will add amlodipine -Continue lisinopril and metoprolol Return in about 2 weeks (around 04/01/2018) for HTN.   Acquired hypothyroidism Update TSH today.    >25 minutes face to face time spent with patient with 50% of time spent counseling patient regarding importance of BP control and lifestyle/dietary changes as outlined above.

## 2018-04-01 ENCOUNTER — Ambulatory Visit: Payer: Medicare Other | Admitting: Family Medicine

## 2018-04-04 ENCOUNTER — Other Ambulatory Visit: Payer: Self-pay

## 2018-04-04 ENCOUNTER — Ambulatory Visit (INDEPENDENT_AMBULATORY_CARE_PROVIDER_SITE_OTHER): Payer: Medicare Other | Admitting: Family Medicine

## 2018-04-04 ENCOUNTER — Telehealth: Payer: Self-pay | Admitting: Family Medicine

## 2018-04-04 ENCOUNTER — Encounter: Payer: Self-pay | Admitting: Family Medicine

## 2018-04-04 DIAGNOSIS — I2510 Atherosclerotic heart disease of native coronary artery without angina pectoris: Secondary | ICD-10-CM | POA: Diagnosis not present

## 2018-04-04 DIAGNOSIS — I10 Essential (primary) hypertension: Secondary | ICD-10-CM | POA: Diagnosis not present

## 2018-04-04 MED ORDER — LEVOTHYROXINE SODIUM 75 MCG PO TABS: 75.0000 ug | ORAL_TABLET | Freq: Every day | ORAL | 1 refills | Status: DC

## 2018-04-04 NOTE — Patient Instructions (Signed)

## 2018-04-04 NOTE — Telephone Encounter (Signed)
This encounter was created in error - please disregard.

## 2018-04-04 NOTE — Addendum Note (Signed)
Addended by: Maryelizabeth Rowan A on: 04/04/2018 02:17 PM   Modules accepted: Orders

## 2018-04-04 NOTE — Progress Notes (Signed)
Diamond Hughes - 68 y.o. female MRN [id number]  Date of birth: 09-06-1950  Subjective Chief Complaint  Patient presents with  . Follow-up    HTN  Face to face OV , Meds good , @ [facility]. 137/83    HPI Diamond Hughes is a 68 y.o. female is here today for follow up of HTN.  Seen a couple of weeks ago and BP noted to be elevated.  She had been compliant with medications however had been eating a large amount of processed lunch meats.  She states that she has cut back significantly on processed meat consumption.  BP at home this morning 137/83 with similar reading here in office today.  She denies chest pain, shortness of breath, palpitations, headache or vision change.   ROS:  A comprehensive ROS was completed and negative except as noted per HPI  No Known Allergies  Past Medical History:  Diagnosis Date  . CAD (coronary artery disease)    a. 2000 s/p MI and RCA PCI [facility]);  b. 02/2016 NSTEMI/PCI: LM nl, LAD nl, LCX 100p (3.5x18 Resolute Onyx DES), OM1/3 small, RCA 70p aneurysmal, 3m/d ISR-->attempted PCI, could not cross w/ wire-->med rx, EF 45-50%.  . Hyperlipidemia   . Hypertension   . Ischemic cardiomyopathy    a. 02/2016 Echo: EF 45-50%, basal-mid inferior/posterior HK, Gr1 DD, triv MR, nl LA size, nl RV fxn.  . Normocytic anemia     Past Surgical History:  Procedure Laterality Date  . ANGIOPLASTY  2000  . CORONARY STENT INTERVENTION N/A 02/10/2016   Procedure: Coronary Stent Intervention;  Surgeon: Lyn Records, MD;  Location: [facility] INVASIVE CV LAB;  Service: Cardiovascular;  Laterality: N/A;  . LEFT HEART CATH AND CORONARY ANGIOGRAPHY N/A 02/10/2016   Procedure: Left Heart Cath and Coronary Angiography;  Surgeon: Lyn Records, MD;  Location: [facility] INVASIVE CV LAB;  Service: Cardiovascular;  Laterality: N/A;    Social History   Socioeconomic History  . Marital status: Married    Spouse name: Not on file  . Number of children: Not on file  . Years of education: Not on file   . Highest education level: Not on file  Occupational History  . Not on file  Social Needs  . Financial resource strain: Not on file  . Food insecurity:    Worry: Not on file    Inability: Not on file  . Transportation needs:    Medical: Not on file    Non-medical: Not on file  Tobacco Use  . Smoking status: Former Smoker    Packs/day: 0.20    Years: 15.00    Pack years: 3.00    Types: Cigarettes  . Smokeless tobacco: Never Used  Substance and Sexual Activity  . Alcohol use: Yes    Comment: 40oz beer  . Drug use: No  . Sexual activity: Yes    Birth control/protection: None  Lifestyle  . Physical activity:    Days per week: Not on file    Minutes per session: Not on file  . Stress: Not on file  Relationships  . Social connections:    Talks on phone: Not on file    Gets together: Not on file    Attends religious service: Not on file    Active member of club or organization: Not on file    Attends meetings of clubs or organizations: Not on file    Relationship status: Not on file  Other Topics Concern  . Not  on file  Social History Narrative  . Not on file    Family History  Problem Relation Age of Onset  . Heart disease Mother   . Hyperlipidemia Mother   . Hypertension Mother   . Hyperlipidemia Sister   . Hypertension Sister   . Breast cancer Maternal Grandmother     Health Maintenance  Topic Date Due  . HIV Screening  05/03/1967  . TETANUS/TDAP  05/03/1971  . PAP SMEAR-Modifier  05/02/1973  . COLONOSCOPY  05/03/2002  . DEXA SCAN  05/02/2017  . PNA vac Low Risk Adult (1 of 2 - PCV13) 05/02/2017  . INFLUENZA VACCINE  08/05/2017  . MAMMOGRAM  11/20/2019  . Hepatitis C Screening  Completed    ----------------------------------------------------------------------------------------------------------------------------------------------------------------------------------------------------------------- Physical Exam BP 132/78 (BP Location: Right Arm, Patient  Position: Sitting, Cuff Size: Normal)   Temp 98.5 F (36.9 C) (Oral)   Ht 5\' 3"  (1.6 m)   Wt 185 lb 3.2 oz (84 kg)   SpO2 95%   BMI 32.81 kg/m   Physical Exam Constitutional:      Appearance: Normal appearance.  HENT:     Head: Normocephalic and atraumatic.     Mouth/Throat:     Mouth: Mucous membranes are moist.  Eyes:     General: No scleral icterus. Neck:     Musculoskeletal: Neck supple.  Cardiovascular:     Rate and Rhythm: Normal rate and regular rhythm.  Pulmonary:     Effort: Pulmonary effort is normal.     Breath sounds: Normal breath sounds.  Skin:    General: Skin is warm and dry.  Neurological:     General: No focal deficit present.     Mental Status: She is alert.  Psychiatric:        Mood and Affect: Mood normal.        Behavior: Behavior normal.     ------------------------------------------------------------------------------------------------------------------------------------------------------------------------------------------------------------------- Assessment and Plan  HTN (hypertension) -BP better controlled today -Discussed following low salt diet.  -Continue current medications.   CAD (coronary artery disease) -Denies anginal symptoms.  -Discussed importance of good BP control.

## 2018-04-04 NOTE — Assessment & Plan Note (Signed)
-  Denies anginal symptoms.  -Discussed importance of good BP control.

## 2018-04-04 NOTE — Telephone Encounter (Signed)
Pt states she saw Dr. Ashley Royalty today and told him she did not need any medication refills. She states she does need her levothyroxine refilled. Request 3 month supply to go to [facility] on [address].

## 2018-04-04 NOTE — Assessment & Plan Note (Signed)
-  BP better controlled today -Discussed following low salt diet.  -Continue current medications.

## 2018-04-05 ENCOUNTER — Telehealth: Payer: Self-pay

## 2018-04-05 NOTE — Telephone Encounter (Signed)
   Cardiac Questionnaire:    Since your last visit or hospitalization:    1. Have you been having new or worsening chest pain? NO   2. Have you been having new or worsening shortness of breath? NO 3. Have you been having new or worsening leg swelling, wt gain, or increase in abdominal girth (pants fitting more tightly)?  NO   4. Have you had any passing out spells? NO    *A YES to any of these questions would result in the appointment being kept. *If all the answers to these questions are NO, we should indicate that given the current situation regarding the worldwide coronarvirus pandemic, at the recommendation of the CDC, we are looking to limit gatherings in our waiting area, and thus will reschedule their appointment beyond four weeks from today.   _____________   Pt denies cardiac symptoms and agreeable to postpone upcoming appt. Advised pt to call back with any other needs. She verbalized understanding. Will route to cancel pool                  .

## 2018-04-06 ENCOUNTER — Ambulatory Visit: Payer: Self-pay

## 2018-04-06 NOTE — Telephone Encounter (Signed)
Pt called to verify what pharmacy her Levothyroxine was called to. Pt informed and verbalized understanding. No triage needed.  Reason for Disposition . Pharmacy calling with prescription question and triager answers question  Answer Assessment - Initial Assessment Questions 1. SYMPTOMS: "Do you have any symptoms?"     no 2. SEVERITY: If symptoms are present, ask "Are they mild, moderate or severe?" n/a  Protocols used: MEDICATION QUESTION CALL-A-AH

## 2018-04-11 NOTE — Telephone Encounter (Signed)
Please set this patient up for an evisit with me. preferably doximity if possible. thanks

## 2018-04-12 NOTE — Telephone Encounter (Signed)
Called pt to set up possible evisit, left message asking her to call the office.  

## 2018-04-12 NOTE — Telephone Encounter (Signed)
Called pt and left message asking her to call the office.

## 2018-04-12 NOTE — Telephone Encounter (Signed)
Patient scheduled for DOXIMITY [place] Visit with Jacolyn Reedy, PA on 4/8

## 2018-04-12 NOTE — Telephone Encounter (Signed)
Pt prefers phone call vs webex unless you can use doximity. Pt is aware i'll call her 30 mins prior to her visit and have her bp ready.     Virtual Visit Pre-Appointment Phone Call  Steps For Call:  1. Confirm consent - "In the setting of the current Covid19 crisis, you are scheduled for a (phone or video) visit with your provider on (date) at (time).  Just as we do with many in-office visits, in order for you to participate in this visit, we must obtain consent.  If you'd like, I can send this to your mychart (if signed up) or email for you to review.  Otherwise, I can obtain your verbal consent now.  All virtual visits are billed to your insurance company just like a normal visit would be.  By agreeing to a virtual visit, we'd like you to understand that the technology does not allow for your provider to perform an examination, and thus may limit your provider's ability to fully assess your condition.  Finally, though the technology is pretty good, we cannot assure that it will always work on either your or our end, and in the setting of a video visit, we may have to convert it to a phone-only visit.  In either situation, we cannot ensure that we have a secure connection.  Are you willing to proceed?"  2. Give patient instructions for WebEx download to smartphone as below if video visit  3. Advise patient to be prepared with any vital sign or heart rhythm information, their current medicines, and a piece of paper and pen handy for any instructions they may receive the day of their visit  4. Inform patient they will receive a phone call 15 minutes prior to their appointment time (may be from unknown caller ID) so they should be prepared to answer  5. Confirm that appointment type is correct in Epic appointment notes (video vs telephone)    TELEPHONE CALL NOTE  Diamond Hughes Hughes has been deemed a candidate for a follow-up tele-health visit to limit community exposure during the Covid-19  pandemic. I spoke with the patient via phone to ensure availability of phone/video source, confirm preferred email & phone number, and discuss instructions and expectations.  I reminded Diamond Hughes to be prepared with any vital sign and/or heart rhythm information that could potentially be obtained via home monitoring, at the time of her visit. I reminded Diamond Hughes to expect a phone call at the time of her visit if her visit.  Did the patient verbally acknowledge consent to treatment? yes  Adele SchilderDanielle M Avalon Coppinger, CMA 04/12/2018 3:25 PM   DOWNLOADING THE WEBEX SOFTWARE TO SMARTPHONE  - If Apple, go to [employer] Store and type in WebEx in the search bar. Download [employer] Meetings, the blue/green circle. The app is free but as with any other app downloads, their phone may require them to verify saved payment information or Apple password. The patient does NOT have to create an account.  - If Android, ask patient to go to [employer] Play Store and type in WebEx in the search bar. Download [employer] Meetings, the blue/green circle. The app is free but as with any other app downloads, their phone may require them to verify saved payment information or Android password. The patient does NOT have to create an account.   CONSENT FOR TELE-HEALTH VISIT - PLEASE REVIEW  I hereby voluntarily request, consent and authorize [facility] and its employed or contracted  physicians, physician assistants, nurse practitioners or other licensed health care professionals (the Practitioner), to provide me with telemedicine health care services (the "Services") as deemed necessary by the treating Practitioner. I acknowledge and consent to receive the Services by the Practitioner via telemedicine. I understand that the telemedicine visit will involve communicating with the Practitioner through live audiovisual communication technology and the disclosure of certain medical information by electronic transmission. I  acknowledge that I have been given the opportunity to request an in-person assessment or other available alternative prior to the telemedicine visit and am voluntarily participating in the telemedicine visit.  I understand that I have the right to withhold or withdraw my consent to the use of telemedicine in the course of my care at any time, without affecting my right to future care or treatment, and that the Practitioner or I may terminate the telemedicine visit at any time. I understand that I have the right to inspect all information obtained and/or recorded in the course of the telemedicine visit and may receive copies of available information for a reasonable fee.  I understand that some of the potential risks of receiving the Services via telemedicine include:  Marland Kitchen Delay or interruption in medical evaluation due to technological equipment failure or disruption; . Information transmitted may not be sufficient (e.g. poor resolution of images) to allow for appropriate medical decision making by the Practitioner; and/or  . In rare instances, security protocols could fail, causing a breach of personal health information.  Furthermore, I acknowledge that it is my responsibility to provide information about my medical history, conditions and care that is complete and accurate to the best of my ability. I acknowledge that Practitioner's advice, recommendations, and/or decision may be based on factors not within their control, such as incomplete or inaccurate data provided by me or distortions of diagnostic images or specimens that may result from electronic transmissions. I understand that the practice of medicine is not an exact science and that Practitioner makes no warranties or guarantees regarding treatment outcomes. I acknowledge that I will receive a copy of this consent concurrently upon execution via email to the email address I last provided but may also request a printed copy by calling the office of  [facility].    I understand that my insurance will be billed for this visit.   I have read or had this consent read to me. . I understand the contents of this consent, which adequately explains the benefits and risks of the Services being provided via telemedicine.  . I have been provided ample opportunity to ask questions regarding this consent and the Services and have had my questions answered to my satisfaction. . I give my informed consent for the services to be provided through the use of telemedicine in my medical care  By participating in this telemedicine visit I agree to the above.

## 2018-04-12 NOTE — Telephone Encounter (Signed)
Received a message from [place] saying pt returned my call and is interested in a evisit.

## 2018-04-13 ENCOUNTER — Telehealth (INDEPENDENT_AMBULATORY_CARE_PROVIDER_SITE_OTHER): Payer: Medicare Other | Admitting: Physician Assistant

## 2018-04-13 ENCOUNTER — Encounter: Payer: Self-pay | Admitting: Physician Assistant

## 2018-04-13 ENCOUNTER — Other Ambulatory Visit: Payer: Self-pay

## 2018-04-13 VITALS — BP 144/86 | Ht 63.0 in | Wt 185.0 lb

## 2018-04-13 DIAGNOSIS — E785 Hyperlipidemia, unspecified: Secondary | ICD-10-CM

## 2018-04-13 DIAGNOSIS — I10 Essential (primary) hypertension: Secondary | ICD-10-CM

## 2018-04-13 DIAGNOSIS — I2510 Atherosclerotic heart disease of native coronary artery without angina pectoris: Secondary | ICD-10-CM

## 2018-04-13 DIAGNOSIS — I255 Ischemic cardiomyopathy: Secondary | ICD-10-CM

## 2018-04-13 MED ORDER — ATORVASTATIN CALCIUM 80 MG PO TABS: 80.0000 mg | ORAL_TABLET | Freq: Every day | ORAL | 3 refills | Status: DC

## 2018-04-13 MED ORDER — CLOPIDOGREL BISULFATE 75 MG PO TABS: 75.0000 mg | ORAL_TABLET | Freq: Every day | ORAL | 3 refills | Status: DC

## 2018-04-13 MED ORDER — NITROGLYCERIN 0.4 MG SL SUBL: 0.4000 mg | SUBLINGUAL_TABLET | SUBLINGUAL | 3 refills | Status: DC | PRN

## 2018-04-13 NOTE — Patient Instructions (Addendum)
Medication Instructions:  Your physician recommends that you continue on your current medications as directed. Please refer to the Current Medication list given to you today.  If you need a refill on your cardiac medications before your next appointment, please call your pharmacy.   Lab work: Your physician recommends that you return for a FASTING labs (CBC, CMET, LIPIDS) on 08/15/18   If you have labs (blood work) drawn today and your tests are completely normal, you will receive your results only by: Marland Kitchen MyChart Message (if you have MyChart) OR . A paper copy in the mail If you have any lab test that is abnormal or we need to change your treatment, we will call you to review the results.  Testing/Procedures: Your physician has requested that you have an echocardiogram on 08/15/18 at 7:30 AM. Arrive at 7:00 AM. Echocardiography is a painless test that uses sound waves to create images of your heart. It provides your doctor with information about the size and shape of your heart and how well your heart's chambers and valves are working. This procedure takes approximately one hour. There are no restrictions for this procedure.   Follow-Up: At [facility], you and your health needs are our priority.  As part of our continuing mission to provide you with exceptional heart care, we have created designated Provider Care Teams.  These Care Teams include your primary Cardiologist (physician) and Advanced Practice Providers (APPs -  Physician Assistants and Nurse Practitioners) who all work together to provide you with the care you need, when you need it. . You will need a follow up appointment in 1 year.  Please call our office 2 months in advance to schedule this appointment.  You may see Earney Hamburg, MD or one of the following Advanced Practice Providers on your designated Care Team:   . Robbie Lis, PA-C . Dayna Dunn, PA-C . Jacolyn Reedy, PA-C  Any Other Special Instructions Will Be  Listed Below (If Applicable).  Your provider recommends that you maintain 150 minutes per week of moderate aerobic activity.  Two Gram Sodium Diet 2000 mg  What is Sodium? Sodium is a mineral found naturally in many foods. The most significant source of sodium in the diet is table salt, which is about 40% sodium.  Processed, convenience, and preserved foods also contain a large amount of sodium.  The body needs only 500 mg of sodium daily to function,  A normal diet provides more than enough sodium even if you do not use salt.  Why Limit Sodium? A build up of sodium in the body can cause thirst, increased blood pressure, shortness of breath, and water retention.  Decreasing sodium in the diet can reduce edema and risk of heart attack or stroke associated with high blood pressure.  Keep in mind that there are many other factors involved in these health problems.  Heredity, obesity, lack of exercise, cigarette smoking, stress and what you eat all play a role.  General Guidelines:  Do not add salt at the table or in cooking.  One teaspoon of salt contains over 2 grams of sodium.  Read food labels  Avoid processed and convenience foods  Ask your dietitian before eating any foods not dicussed in the menu planning guidelines  Consult your physician if you wish to use a salt substitute or a sodium containing medication such as antacids.  Limit milk and milk products to 16 oz (2 cups) per day.  Shopping Hints:  READ LABELS!! "Dietetic" does  not necessarily mean low sodium.  Salt and other sodium ingredients are often added to foods during processing.   Menu Planning Guidelines Food Group Choose More Often Avoid  Beverages (see also the milk group All fruit juices, low-sodium, salt-free vegetables juices, low-sodium carbonated beverages Regular vegetable or tomato juices, commercially softened water used for drinking or cooking  Breads and Cereals Enriched white, wheat, rye and pumpernickel  bread, hard rolls and dinner rolls; muffins, cornbread and waffles; most dry cereals, cooked cereal without added salt; unsalted crackers and breadsticks; low sodium or homemade bread crumbs Bread, rolls and crackers with salted tops; quick breads; instant hot cereals; pancakes; commercial bread stuffing; self-rising flower and biscuit mixes; regular bread crumbs or cracker crumbs  Desserts and Sweets Desserts and sweets mad with mild should be within allowance Instant pudding mixes and cake mixes  Fats Butter or margarine; vegetable oils; unsalted salad dressings, regular salad dressings limited to 1 Tbs; light, sour and heavy cream Regular salad dressings containing bacon fat, bacon bits, and salt pork; snack dips made with instant soup mixes or processed cheese; salted nuts  Fruits Most fresh, frozen and canned fruits Fruits processed with salt or sodium-containing ingredient (some dried fruits are processed with sodium sulfites        Vegetables Fresh, frozen vegetables and low- sodium canned vegetables Regular canned vegetables, sauerkraut, pickled vegetables, and others prepared in brine; frozen vegetables in sauces; vegetables seasoned with ham, bacon or salt pork  Condiments, Sauces, Miscellaneous  Salt substitute with physician's approval; pepper, herbs, spices; vinegar, lemon or lime juice; hot pepper sauce; garlic powder, onion powder, low sodium soy sauce (1 Tbs.); low sodium condiments (ketchup, chili sauce, mustard) in limited amounts (1 tsp.) fresh ground horseradish; unsalted tortilla chips, pretzels, potato chips, popcorn, salsa (1/4 cup) Any seasoning made with salt including garlic salt, celery salt, onion salt, and seasoned salt; sea salt, rock salt, kosher salt; meat tenderizers; monosodium glutamate; mustard, regular soy sauce, barbecue, sauce, chili sauce, teriyaki sauce, steak sauce, Worcestershire sauce, and most flavored vinegars; canned gravy and mixes; regular condiments;  salted snack foods, olives, picles, relish, horseradish sauce, catsup   Food preparation: Try these seasonings Meats:    Pork Sage, onion Serve with applesauce  Chicken Poultry seasoning, thyme, parsley Serve with cranberry sauce  Lamb Curry powder, rosemary, garlic, thyme Serve with mint sauce or jelly  Veal Marjoram, basil Serve with current jelly, cranberry sauce  Beef Pepper, bay leaf Serve with dry mustard, unsalted chive butter  Fish [place] leaf, dill Serve with unsalted lemon butter, unsalted parsley butter  Vegetables:    Asparagus Lemon juice   Broccoli Lemon juice   Carrots Mustard dressing parsley, mint, nutmeg, glazed with unsalted butter and sugar   Green beans Marjoram, lemon juice, nutmeg,dill seed   Tomatoes Basil, marjoram, onion   Spice /blend for [employer]" 4 tsp ground thyme 1 tsp ground sage 3 tsp ground rosemary 4 tsp ground marjoram   Test your knowledge 1. A product that says "Salt Free" may still contain sodium. True or False 2. Garlic Powder and Hot Pepper Sauce an be used as alternative seasonings.True or False 3. Processed foods have more sodium than fresh foods.  True or False 4. Canned Vegetables have less sodium than froze True or False  WAYS TO DECREASE YOUR SODIUM INTAKE 1. Avoid the use of added salt in cooking and at the table.  Table salt (and other prepared seasonings which contain salt) is probably one of the  greatest sources of sodium in the diet.  Unsalted foods can gain flavor from the sweet, sour, and butter taste sensations of herbs and spices.  Instead of using salt for seasoning, try the following seasonings with the foods listed.  Remember: how you use them to enhance natural food flavors is limited only by your creativity... Allspice-Meat, fish, eggs, fruit, peas, red and yellow vegetables Almond Extract-Fruit baked goods Anise Seed-Sweet breads, fruit, carrots, beets, cottage cheese, cookies (tastes like licorice) Basil-Meat, fish, eggs,  vegetables, rice, vegetables salads, soups, sauces Bay Leaf-Meat, fish, stews, poultry Burnet-Salad, vegetables (cucumber-like flavor) Caraway Seed-Bread, cookies, cottage cheese, meat, vegetables, cheese, rice Cardamon-Baked goods, fruit, soups Celery Powder or seed-Salads, salad dressings, sauces, meatloaf, soup, bread.Do not use  celery salt Chervil-Meats, salads, fish, eggs, vegetables, cottage cheese (parsley-like flavor) Chili Power-Meatloaf, chicken cheese, corn, eggplant, egg dishes Chives-Salads cottage cheese, egg dishes, soups, vegetables, sauces Cilantro-Salsa, casseroles Cinnamon-Baked goods, fruit, pork, lamb, chicken, carrots Cloves-Fruit, baked goods, fish, pot roast, green beans, beets, carrots Coriander-Pastry, cookies, meat, salads, cheese (lemon-orange flavor) Cumin-Meatloaf, fish,cheese, eggs, cabbage,fruit pie (caraway flavor) [employer]Curry Powder-Meat, fruit, eggs, fish, poultry, cottage cheese, vegetables Dill Seed-Meat, cottage cheese, poultry, vegetables, fish, salads, bread Fennel Seed-Bread, cookies, apples, pork, eggs, fish, beets, cabbage, cheese, Licorice-like flavor Garlic-(buds or powder) Salads, meat, poultry, fish, bread, butter, vegetables, potatoes.Do not  use garlic salt Ginger-Fruit, vegetables, baked goods, meat, fish, poultry Horseradish Root-Meet, vegetables, butter Lemon Juice or Extract-Vegetables, fruit, tea, baked goods, fish salads Mace-Baked goods fruit, vegetables, fish, poultry (taste like nutmeg) Maple Extract-Syrups Marjoram-Meat, chicken, fish, vegetables, breads, green salads (taste like Sage) Mint-Tea, lamb, sherbet, vegetables, desserts, carrots, cabbage Mustard, Dry or Seed-Cheese, eggs, meats, vegetables, poultry Nutmeg-Baked goods, fruit, chicken, eggs, vegetables, desserts Onion Powder-Meat, fish, poultry, vegetables, cheese, eggs, bread, rice salads (Do not use   Onion salt) Orange Extract-Desserts, baked goods Oregano-Pasta, eggs,  cheese, onions, pork, lamb, fish, chicken, vegetables, green salads Paprika-Meat, fish, poultry, eggs, cheese, vegetables Parsley Flakes-Butter, vegetables, meat fish, poultry, eggs, bread, salads (certain forms may   Contain sodium Pepper-Meat fish, poultry, vegetables, eggs Peppermint Extract-Desserts, baked goods Poppy Seed-Eggs, bread, cheese, fruit dressings, baked goods, noodles, vegetables, cottage  [employer] RxCheese Poultry Seasoning-Poultry,veal Rosemary-Lamb, poultry, meat, fish, cauliflower, turnips,eggs bread Saffron-Rice, bread, veal, chicken, fish, eggs Sage-Meat, fish, poultry, onions, eggplant, tomateos, pork, stews Savory-Eggs, salads, poultry, meat, rice, vegetables, soups, pork Tarragon-Meat, poultry, fish, eggs, butter, vegetables (licorice-like flavor)  Thyme-Meat, poultry, fish, eggs, vegetables, (clover-like flavor), sauces, soups Tumeric-Salads, butter, eggs, fish, rice, vegetables (saffron-like flavor) Vanilla Extract-Baked goods, candy Vinegar-Salads, vegetables, meat marinades Walnut Extract-baked goods, candy  2. Choose your Foods Wisely   The following is a list of foods to avoid which are high in sodium:  Meats-Avoid all smoked, canned, salt cured, dried and kosher meat and fish as well as Anchovies   Lox F[employer] SemiconductorBacon    Luncheon meats:Bologna, Liverwurst, Pastrami Canned meat or fish  Marinated herring Caviar    Pepperoni Corned Beef   Pizza Dried chipped beef  Salami Frozen breaded fish or meat Salt pork Frankfurters or hot dogs  Sardines Gefilte fish   Sausage Ham (boiled ham, Proscuitto Smoked butt    spiced ham)   Spam      TV Dinners Vegetables Canned vegetables (Regular) Relish Canned mushrooms  Sauerkraut Olives    Tomato juice Pickles  Bakery and Dessert Products Canned puddings  Cream pies Cheesecake   Decorated cakes Cookies  Beverages/Juices Tomato juice, regular  Gatorade   V-8 vegetable juice, regular  Breads and Cereals Biscuit  mixes   Salted potato chips, corn chips, pretzels Bread stuffing mixes  Salted crackers and rolls Pancake and waffle mixes Self-rising flour  Seasonings Accent    Meat sauces Barbecue sauce  Meat tenderizer Catsup    Monosodium glutamate (MSG) Celery salt   Onion salt Chili sauce   Prepared mustard Garlic salt   Salt, seasoned salt, sea salt Gravy mixes   Soy sauce Horseradish   Steak sauce Ketchup   Tartar sauce Lite salt    Teriyaki sauce Marinade mixes   Worcestershire sauce  Others Baking powder   Cocoa and cocoa mixes Baking soda   Commercial casserole mixes Candy-caramels, chocolate  Dehydrated soups    Bars, fudge,nougats  Instant rice and pasta mixes Canned broth or soup  Maraschino cherries Cheese, aged and processed cheese and cheese spreads  Learning Assessment Quiz  Indicated T (for True) or F (for False) for each of the following statements:  1. _____ Fresh fruits and vegetables and unprocessed grains are generally low in sodium 2. _____ Water may contain a considerable amount of sodium, depending on the source 3. _____ You can always tell if a food is high in sodium by tasting it 4. _____ Certain laxatives my be high in sodium and should be avoided unless prescribed   by a physician or pharmacist 5. _____ Salt substitutes may be used freely by anyone on a sodium restricted diet 6. _____ Sodium is present in table salt, food additives and as a natural component of   most foods 7. _____ Table salt is approximately 90% sodium 8. _____ Limiting sodium intake may help prevent excess fluid accumulation in the body 9. _____ On a sodium-restricted diet, seasonings such as bouillon soy sauce, and    cooking wine should be used in place of table salt 10. _____ On an ingredient list, a product which lists monosodium glutamate as the first   ingredient is an appropriate food to include on a low sodium diet  Circle the best answer(s) to the following statements (Hint: there  may be more than one correct answer)  11. On a low-sodium diet, some acceptable snack items are:    A. Olives  F. Bean dip   K. Grapefruit juice    B. Salted Pretzels G. Commercial Popcorn   L. Canned peaches    C. Carrot Sticks  H. Bouillon   M. Unsalted nuts   D. [place] fries  I. Peanut butter crackers N. Salami   E. Sweet pickles J. Tomato Juice   O. Pizza  12.  Seasonings that may be used freely on a reduced - sodium diet include   A. Lemon wedges F.Monosodium glutamate K. Celery seed    B.Soysauce   G. Pepper   L. Mustard powder   C. Sea salt  H. Cooking wine  M. Onion flakes   D. Vinegar  E. Prepared horseradish N. Salsa   E. Sage   J. Worcestershire sauce  O. Chutney

## 2018-04-13 NOTE — Progress Notes (Signed)
Virtual Visit via Video Note   This visit type was conducted due to national recommendations for restrictions regarding the COVID-19 Pandemic (e.g. social distancing) in an effort to limit this patient's exposure and mitigate transmission in our community.  Due to her co-morbid illnesses, this patient is at least at moderate risk for complications without adequate follow up.  This format is felt to be most appropriate for this patient at this time.  All issues noted in this document were discussed and addressed.  A limited physical exam was performed with this format.  Please refer to the patient's chart for her consent to telehealth for [facility].   Evaluation Performed:  Follow-up visit  Date:  04/13/2018   ID:  Diamond Hughes, DOB 08-13-1950, MRN [id number]  Patient Location: Home  Provider Location: Home  PCP:  Diamond Nutting, DO  Cardiologist:  Diamond Chandler, MD   Electrophysiologist:  None   Chief Complaint:  f/u  History of Present Illness:    Diamond Hughes is a 68 y.o. female who presents via audio/video conferencing for a telehealth visit today.    The patient has history of CAD status post MI treated with stent to the RCA in 2000 in [place].  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  She had DES to the circumflex and failed angioplasty to the RCA in-stent restenosis and distal de novo disease due to inability to cross the balloon.  Medical therapy recommended for the RCA lesion.  2D echo 02/2017 showed LVEF 45 to 50% with basal to mid inferior posterior hypokinesis and grade 1 DD.  Frequent PVCs on prior EKGs and was advised to reduce caffeine and  alcohol consumption.  Last seen in our office 09/2017 and was doing well.  No changes made.  Patient feels well. Denies chest pain, shortness  of breath, dizziness, edema or syncope. Lives on 3rd floor so does stairs but stopped going to the gym. Has gained some weight. Eating a lot of lunch meat and getting extra   The patient does not have symptoms concerning for COVID-19 infection (fever, chills, cough, or new shortness of breath).    Past Medical History:  Diagnosis Date  . CAD (coronary artery disease)    a. 2000 s/p MI and RCA PCI [facility]);  b. 02/2016 NSTEMI/PCI: LM nl, LAD nl, LCX 100p (3.5x18 Resolute Onyx DES), OM1/3 small, RCA 70p aneurysmal, 57md ISR-->attempted PCI, could not cross w/ wire-->med rx, EF 45-50%.  . Hyperlipidemia   . Hypertension   . Ischemic cardiomyopathy    a. 02/2016 Echo: EF 45-50%, basal-mid inferior/posterior HK, Gr1 DD, triv MR, nl LA size, nl RV fxn.  . Normocytic anemia    Past Surgical History:  Procedure Laterality Date  . ANGIOPLASTY  2000  . CORONARY STENT INTERVENTION N/A 02/10/2016   Procedure: Coronary Stent Intervention;  Surgeon: HBelva Crome MD;  [facility];  Service: Cardiovascular;  Laterality: N/A;  . LEFT HEART CATH AND CORONARY ANGIOGRAPHY N/A 02/10/2016   Procedure: Left Heart Cath and Coronary Angiography;  Surgeon: HBelva Crome MD;  [facility];  Service: Cardiovascular;  Laterality: N/A;     Current Meds  Medication Sig  . amLODipine (NORVASC) 5 MG tablet Take 1 tablet (5 mg total) by mouth daily.  .Marland Kitchen  aspirin 81 MG tablet Take 1 tablet (81 mg total) by mouth daily.  Marland Kitchen atorvastatin (LIPITOR) 80 MG tablet Take 1 tablet (80 mg total) by mouth daily at 6 PM.  . clopidogrel (PLAVIX) 75 MG tablet Take 1 tablet (75 mg total) by mouth daily with breakfast.  . levothyroxine (SYNTHROID, LEVOTHROID) 75 MCG tablet Take 1 tablet (75 mcg total) by mouth daily.  Marland Kitchen lisinopril (PRINIVIL,ZESTRIL) 20 MG tablet Take 1 tablet (20 mg total) by mouth daily.  . metoprolol tartrate (LOPRESSOR) 50 MG tablet Take 1 tablet (50 mg total) by mouth 2 (two) times  daily.  . nitroGLYCERIN (NITROSTAT) 0.4 MG SL tablet Place 1 tablet (0.4 mg total) under the tongue every 5 (five) minutes x 3 doses as needed for chest pain.     Allergies:   Patient has no known allergies.   Social History   Tobacco Use  . Smoking status: Former Smoker    Packs/day: 0.20    Years: 15.00    Pack years: 3.00    Types: Cigarettes  . Smokeless tobacco: Never Used  Substance Use Topics  . Alcohol use: Yes    Comment: 40oz beer  . Drug use: No     Family Hx: The patient's family history includes Breast cancer in her maternal grandmother; Heart disease in her mother; Hyperlipidemia in her mother and sister; Hypertension in her mother and sister.  ROS:   Please see the history of present illness.    Review of Systems  Constitution: Positive for weight gain.  HENT: Negative.   Eyes: Negative.   Cardiovascular: Positive for leg swelling.  Respiratory: Negative.   Hematologic/Lymphatic: Negative.   Musculoskeletal: Negative.  Negative for joint pain.  Gastrointestinal: Negative.   Genitourinary: Negative.   Neurological: Negative.     All other systems reviewed and are negative.   Prior CV studies:   The following studies were reviewed today:  Echocardiogram: 02/10/16 Study Conclusions   - Left ventricle: The cavity size was normal. Wall thickness was   normal. Systolic function was mildly reduced. The estimated   ejection fraction was in the range of 45% to 50%. Basal to mid   inferior/posterior wall hypokinesis. Doppler parameters are   consistent with abnormal left ventricular relaxation (grade 1   diastolic dysfunction). The E/e&' ratio is between 8-15,   suggesting indeterminate LV filling pressure. - Mitral valve: Mildly thickened leaflets . There was trivial   regurgitation. - Left atrium: The atrium was normal in size. - Right ventricle: The cavity size was normal. Systolic function is   reduced. - Right atrium: The atrium was normal in size.  - Inferior vena cava: The vessel was normal in size. The   respirophasic diameter changes were in the normal range (= 50%),   consistent with normal central venous pressure.   Impressions:   - LVEF 45-50%, normal wall thickness, basal to mid   inferior/posterior hypokinesis, grade 1 DD, indeterminate LV   filling pressure, normal LA size, normal IVC.   Coronary Stent Intervention   02/10/16  Left Heart Cath and Coronary Angiography  Conclusion     Non-ST elevation myocardial infarction with ongoing chest discomfort upon being placed on the cath table.  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 Severe diffuse in-stent restenosis involving the right coronary throughout the entire stented mid segment. There is also high-grade obstruction greater than 95% distal to the stent. The proximal RCA contains diffuse 50-70% stenosis with a large  saccular aneurysm.  99% mid circumflex with TIMI grade 1 flow.  Widely patent LAD and large diagonal  Normal left main.  Normal LV size, with severe inferior wall hypokinesis. EF 45-50%.  Successful angioplasty and stenting of the mid circumflex from 99% with TIMI grade one flow to 0% with TIMI grade 3 flow. A Resolute Onyx 18 x 3.5 mm DES was placed and dilated to 15 atm using the stent system.  Failed angioplasty of the right coronary in-stent restenosis and distal de novo disease due to inability to cross with the balloon.   RECOMMENDATIONS:  The acute lesion is a circumflex which was treated successfully with DES as noted above.  Aspirin and Plavix for least 12 months or longer.  Whether or not intervention should be attempted on the right coronary will depend upon clinical course with the circumflex coronary artery patent. If no angina, medical therapy would be most reasonable. If continued angina, in several weeks or earlier femoral catheterization with attempted rotational atherectomy/CSI and re-stenting.   Potentially able to be discharged in a.m. if able ambulate without chest discomfort. If chest discomfort continues to be an issue, right coronary intervention will need to be dealt with sooner rather than later.        Labs/Other Tests and Data Reviewed:    EKG:  An ECG dated 09/17/17 was personally reviewed today and demonstrated:  NSR, no acute change  Recent Labs: 08/09/2017: ALT 15; BUN 17; Creatinine, Ser 0.86; Hemoglobin 11.7; Platelets 416.0; Potassium 4.4; Sodium 137 03/18/2018: TSH 3.84   Recent Lipid Panel Lab Results  Component Value Date/Time   CHOL 205 (H) 08/09/2017 01:57 PM   TRIG 112.0 08/09/2017 01:57 PM   HDL 76.30 08/09/2017 01:57 PM   CHOLHDL 3 08/09/2017 01:57 PM   LDLCALC 107 (H) 08/09/2017 01:57 PM    Wt Readings from Last 3 Encounters:  04/13/18 185 lb (83.9 kg)  04/04/18 185 lb 3.2 oz (84 kg)  03/18/18 185 lb 3.2 oz (84 kg)     Objective:    Vital Signs:  BP (!) 144/86   Ht 5' 3" (1.6 m)   Wt 185 lb (83.9 kg)   SpO2 95%   BMI 32.77 kg/m    Well nourished, well developed female in no  acute distress. No JVD , mild ankle edema    ASSESSMENT & PLAN:    CADstatus post MI treated with stent to the RCA in 2000 in [place].  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  She had DES to the circumflex and failed angioplasty to the RCA in-stent restenosis and distal de novo disease due to inability to cross the balloon.  Medical therapy recommended for the RCA lesion.No angina.  Continue Plavix & ASA, & lipitor.150 min exercise weekly.  ICM LVEF 45-50% on echo 02/2016-should repeat later this summer. No CHF symptoms but trace of ankle edema. 2 gm sodium diet discussed..  Essential HTN stable on lisinopril and metoprolol  HLD-LDL 107 08/09/17 on lipitor 80 mg daily repeat FLP and surveillance  labs this summer.  COVID-19 Education: The signs and symptoms of COVID-19 were discussed with the patient and how to seek care for testing (follow up with PCP or arrange E-visit).  The importance of social distancing was discussed today.  Time:   Today, I have spent  15 minutes with the patient with telehealth technology discussing the above problems.     Medication Adjustments/Labs and Tests Ordered: Current medicines are reviewed at length with the patient today.  Concerns regarding medicines are outlined above.  Tests Ordered: No orders of the defined types were placed in this encounter.  Medication Changes: No orders of the defined types were placed in this encounter.   Disposition:  Follow up in 1 year(s) with Dr. Angelena Form  Signed, Ermalinda Barrios, PA-C  04/13/2018 1:37 PM    [place]

## 2018-04-19 ENCOUNTER — Telehealth: Payer: Medicare Other | Admitting: Physician Assistant

## 2018-04-20 ENCOUNTER — Telehealth: Payer: Self-pay

## 2018-04-20 ENCOUNTER — Ambulatory Visit: Payer: Medicare Other | Admitting: Family Medicine

## 2018-04-20 NOTE — Telephone Encounter (Signed)
Copied from CRM [id number]. Topic: General - Inquiry >> Apr 19, 2018  4:56 PM Dalphine Handing A wrote: Reason for CRM: Patient said that she wants a nurse to call her back. She is unsure if she should have family come to her home for a few weeks due to the covid and her health conditions.

## 2018-04-20 NOTE — Telephone Encounter (Signed)
Called Pt back. Per Dr. Ashley Royalty. Its ok for family to visit as long as no COVID19 exposures/Sx/Sy. Task completed.

## 2018-04-21 ENCOUNTER — Ambulatory Visit: Payer: Medicare Other | Admitting: Cardiovascular Disease

## 2018-06-09 ENCOUNTER — Other Ambulatory Visit: Payer: Self-pay

## 2018-06-09 NOTE — Patient Outreach (Signed)
[facility]) Care Management  06/09/2018  Diamond Hughes July 02, 1950 [id number]   Medication Adherence call to Diamond Hughes HIPPA Compliant Voice message left with a call back number. Diamond Hughes is showing past due on Lisinopril 20 mg and Atorvastatin 80 mg under [facility] Ins.   Diamond Hughes CPhT Pharmacy Technician [facility] Management Direct Dial [phone]  Fax [phone] [name]

## 2018-06-16 ENCOUNTER — Telehealth: Payer: Self-pay | Admitting: Family Medicine

## 2018-06-16 DIAGNOSIS — I10 Essential (primary) hypertension: Secondary | ICD-10-CM

## 2018-06-16 MED ORDER — AMLODIPINE BESYLATE 5 MG PO TABS: 5.0000 mg | ORAL_TABLET | Freq: Every day | ORAL | 0 refills | Status: DC

## 2018-06-16 NOTE — Telephone Encounter (Signed)
Copied from [place] [id number]. Topic: Quick Communication - Rx Refill/Question >> Jun 16, 2018  9:53 AM Rayann Heman wrote: Medication: amLODipine (NORVASC) 5 MG tablet [[id number]  Has the patient contacted their pharmacy?yes  Preferred Pharmacy (with phone number or street name): [id number] - [place], [address] AT [address] [phone] (Phone) [phone] (Fax)    Agent: Please be advised that RX refills may take up to 3 business days. We ask that you follow-up with your pharmacy.

## 2018-06-16 NOTE — Telephone Encounter (Signed)
Pt requested Rx Refill. Pt has F/U appt in July.

## 2018-06-17 ENCOUNTER — Other Ambulatory Visit: Payer: Self-pay | Admitting: Family Medicine

## 2018-06-17 DIAGNOSIS — I10 Essential (primary) hypertension: Secondary | ICD-10-CM

## 2018-08-15 ENCOUNTER — Other Ambulatory Visit: Payer: Medicare Other

## 2018-08-15 ENCOUNTER — Other Ambulatory Visit (HOSPITAL_COMMUNITY): Payer: Medicare Other

## 2018-08-17 ENCOUNTER — Encounter (INDEPENDENT_AMBULATORY_CARE_PROVIDER_SITE_OTHER): Payer: Self-pay

## 2018-08-17 ENCOUNTER — Other Ambulatory Visit: Payer: Medicare Other | Admitting: *Deleted

## 2018-08-17 ENCOUNTER — Ambulatory Visit (HOSPITAL_COMMUNITY): Payer: Medicare Other | Attending: Cardiovascular Disease

## 2018-08-17 ENCOUNTER — Other Ambulatory Visit: Payer: Self-pay

## 2018-08-17 DIAGNOSIS — I10 Essential (primary) hypertension: Secondary | ICD-10-CM | POA: Diagnosis not present

## 2018-08-17 DIAGNOSIS — E785 Hyperlipidemia, unspecified: Secondary | ICD-10-CM

## 2018-08-17 DIAGNOSIS — I2510 Atherosclerotic heart disease of native coronary artery without angina pectoris: Secondary | ICD-10-CM | POA: Diagnosis not present

## 2018-08-17 DIAGNOSIS — I255 Ischemic cardiomyopathy: Secondary | ICD-10-CM

## 2018-08-17 LAB — LIPID PANEL
Chol/HDL Ratio: 2.5 ratio (ref 0.0–4.4)
Cholesterol, Total: 190 mg/dL (ref 100–199)
HDL: 77 mg/dL (ref >39)
LDL Calculated: 85 mg/dL (ref 0–99)
Triglycerides: 140 mg/dL (ref 0–149)
VLDL Cholesterol Cal: 28 mg/dL (ref 5–40)

## 2018-08-17 LAB — COMPREHENSIVE METABOLIC PANEL
ALT: 17 IU/L (ref 0–32)
AST: 24 IU/L (ref 0–40)
Albumin/Globulin Ratio: 1.6 (ref 1.2–2.2)
Albumin: 4.8 g/dL (ref 3.8–4.8)
Alkaline Phosphatase: 54 IU/L (ref 39–117)
BUN/Creatinine Ratio: 19 (ref 12–28)
BUN: 14 mg/dL (ref 8–27)
Bilirubin Total: 0.5 mg/dL (ref 0.0–1.2)
CO2: 24 mmol/L (ref 20–29)
Calcium: 9.9 mg/dL (ref 8.7–10.3)
Chloride: 98 mmol/L (ref 96–106)
Creatinine, Ser: 0.75 mg/dL (ref 0.57–1.00)
GFR calc Af Amer: 96 mL/min/{1.73_m2} (ref >59)
GFR calc non Af Amer: 83 mL/min/{1.73_m2} (ref >59)
Globulin, Total: 3 g/dL (ref 1.5–4.5)
Glucose: 122 mg/dL — ABNORMAL HIGH (ref 65–99)
Potassium: 4.2 mmol/L (ref 3.5–5.2)
Sodium: 138 mmol/L (ref 134–144)
Total Protein: 7.8 g/dL (ref 6.0–8.5)

## 2018-08-17 LAB — CBC
Hematocrit: 37 % (ref 34.0–46.6)
Hemoglobin: 12 g/dL (ref 11.1–15.9)
MCH: 30.3 pg (ref 26.6–33.0)
MCHC: 32.4 g/dL (ref 31.5–35.7)
MCV: 93 fL (ref 79–97)
Platelets: 445 10*3/uL (ref 150–450)
RBC: 3.96 x10E6/uL (ref 3.77–5.28)
RDW: 13 % (ref 11.7–15.4)
WBC: 6.4 10*3/uL (ref 3.4–10.8)

## 2018-08-19 ENCOUNTER — Telehealth: Payer: Self-pay

## 2018-08-19 DIAGNOSIS — E785 Hyperlipidemia, unspecified: Secondary | ICD-10-CM

## 2018-08-19 MED ORDER — ATORVASTATIN CALCIUM 80 MG PO TABS: 80.0000 mg | ORAL_TABLET | Freq: Every day | ORAL | 3 refills | Status: DC

## 2018-08-19 MED ORDER — NITROGLYCERIN 0.4 MG SL SUBL: 0.4000 mg | SUBLINGUAL_TABLET | SUBLINGUAL | 3 refills | Status: DC | PRN

## 2018-08-19 MED ORDER — EZETIMIBE 10 MG PO TABS: 10.0000 mg | ORAL_TABLET | Freq: Every day | ORAL | 3 refills | Status: DC

## 2018-08-19 NOTE — Telephone Encounter (Signed)
Called and made patient aware of results and recommendations. Rx sent to preferred pharmacy and labs ordered for 11/16.

## 2018-08-19 NOTE — Telephone Encounter (Signed)
-----   Message from Imogene Burn, [place] sent at 08/18/2018  8:40 AM EDT ----- CBC and cmet normal . LDL 85 which is better than a year ago but still above goal of 70 with her history of CAD. Please add zetia 10 mg once daily. Repeat flp and lft's in 3 months

## 2018-09-08 ENCOUNTER — Other Ambulatory Visit: Payer: Self-pay | Admitting: Family Medicine

## 2018-09-08 ENCOUNTER — Other Ambulatory Visit: Payer: Self-pay | Admitting: Physician Assistant

## 2018-09-08 DIAGNOSIS — I10 Essential (primary) hypertension: Secondary | ICD-10-CM

## 2018-09-08 MED ORDER — CLOPIDOGREL BISULFATE 75 MG PO TABS: ORAL_TABLET | ORAL | 2 refills | Status: DC

## 2018-09-08 NOTE — Addendum Note (Signed)
Addended by: Derl Barrow on: 09/08/2018 02:40 PM   Modules accepted: Orders

## 2018-10-04 ENCOUNTER — Telehealth: Payer: Self-pay

## 2018-10-04 NOTE — Telephone Encounter (Signed)
Questions for Screening COVID-19  Symptom onset:   Travel or Contacts: none  During this illness, did/does the patient experience any of the following symptoms? Fever >100.67F []   Yes [x]   No []   Unknown Subjective fever (felt feverish) []   Yes [x]   No []   Unknown Chills []   Yes [x]   No []   Unknown Muscle aches (myalgia) []   Yes [x]   No []   Unknown Runny nose (rhinorrhea) []   Yes [x]   No []   Unknown Sore throat []   Yes [x]   No []   Unknown Cough (new onset or worsening of chronic cough) []   Yes [x]   No []   Unknown Shortness of breath (dyspnea) []   Yes [x]   No []   Unknown Nausea or vomiting []   Yes [x]   No []   Unknown Headache []   Yes [x]   No []   Unknown Abdominal pain  [x]   Yes []   No []   Unknown Diarrhea (?3 loose/looser than normal stools/24hr period) []   Yes [x]   No []   Unknown Other, specify:  Though pt noted diarrhea, states she thinks it is due to her taking her medication with an empty stomach. Pt was cleared to come in by MD

## 2018-10-05 ENCOUNTER — Encounter: Payer: Self-pay | Admitting: Family Medicine

## 2018-10-05 ENCOUNTER — Ambulatory Visit (INDEPENDENT_AMBULATORY_CARE_PROVIDER_SITE_OTHER): Payer: Medicare Other | Admitting: Family Medicine

## 2018-10-05 ENCOUNTER — Other Ambulatory Visit: Payer: Self-pay

## 2018-10-05 DIAGNOSIS — I10 Essential (primary) hypertension: Secondary | ICD-10-CM

## 2018-10-05 DIAGNOSIS — E039 Hypothyroidism, unspecified: Secondary | ICD-10-CM

## 2018-10-05 DIAGNOSIS — I2510 Atherosclerotic heart disease of native coronary artery without angina pectoris: Secondary | ICD-10-CM | POA: Diagnosis not present

## 2018-10-05 DIAGNOSIS — E785 Hyperlipidemia, unspecified: Secondary | ICD-10-CM

## 2018-10-05 NOTE — Assessment & Plan Note (Signed)
-  BP well controlled with current medication.  Continue at current dose.

## 2018-10-05 NOTE — Assessment & Plan Note (Signed)
-  Followed by cardiology.  Denies anginal symptoms.

## 2018-10-05 NOTE — Patient Instructions (Signed)
Managing Your Hypertension Hypertension is commonly called high blood pressure. This is when the force of your blood pressing against the walls of your arteries is too strong. Arteries are blood vessels that carry blood from your heart throughout your body. Hypertension forces the heart to work harder to pump blood, and may cause the arteries to become narrow or stiff. Having untreated or uncontrolled hypertension can cause heart attack, stroke, kidney disease, and other problems. What are blood pressure readings? A blood pressure reading consists of a higher number over a lower number. Ideally, your blood pressure should be below 120/80. The first ("top") number is called the systolic pressure. It is a measure of the pressure in your arteries as your heart beats. The second ("bottom") number is called the diastolic pressure. It is a measure of the pressure in your arteries as the heart relaxes. What does my blood pressure reading mean? Blood pressure is classified into four stages. Based on your blood pressure reading, your health care provider may use the following stages to determine what type of treatment you need, if any. Systolic pressure and diastolic pressure are measured in a unit called mm Hg. Normal  Systolic pressure: below 120.  Diastolic pressure: below 80. Elevated  Systolic pressure: 120-129.  Diastolic pressure: below 80. Hypertension stage 1  Systolic pressure: 130-139.  Diastolic pressure: 80-89. Hypertension stage 2  Systolic pressure: 140 or above.  Diastolic pressure: 90 or above. What health risks are associated with hypertension? Managing your hypertension is an important responsibility. Uncontrolled hypertension can lead to:  A heart attack.  A stroke.  A weakened blood vessel (aneurysm).  Heart failure.  Kidney damage.  Eye damage.  Metabolic syndrome.  Memory and concentration problems. What changes can I make to manage my  hypertension? Hypertension can be managed by making lifestyle changes and possibly by taking medicines. Your health care provider will help you make a plan to bring your blood pressure within a normal range. Eating and drinking   Eat a diet that is high in fiber and potassium, and low in salt (sodium), added sugar, and fat. An example eating plan is called the DASH (Dietary Approaches to Stop Hypertension) diet. To eat this way: ? Eat plenty of fresh fruits and vegetables. Try to fill half of your plate at each meal with fruits and vegetables. ? Eat whole grains, such as whole wheat pasta, brown rice, or whole grain bread. Fill about one quarter of your plate with whole grains. ? Eat low-fat diary products. ? Avoid fatty cuts of meat, processed or cured meats, and poultry with skin. Fill about one quarter of your plate with lean proteins such as fish, chicken without skin, beans, eggs, and tofu. ? Avoid premade and processed foods. These tend to be higher in sodium, added sugar, and fat.  Reduce your daily sodium intake. Most people with hypertension should eat less than 1,500 mg of sodium a day.  Limit alcohol intake to no more than 1 drink a day for nonpregnant women and 2 drinks a day for men. One drink equals 12 oz of beer, 5 oz of wine, or 1 oz of hard liquor. Lifestyle  Work with your health care provider to maintain a healthy body weight, or to lose weight. Ask what an ideal weight is for you.  Get at least 30 minutes of exercise that causes your heart to beat faster (aerobic exercise) most days of the week. Activities may include walking, swimming, or biking.  Include exercise   to strengthen your muscles (resistance exercise), such as weight lifting, as part of your weekly exercise routine. Try to do these types of exercises for 30 minutes at least 3 days a week.  Do not use any products that contain nicotine or tobacco, such as cigarettes and e-cigarettes. If you need help quitting,  ask your health care provider.  Control any long-term (chronic) conditions you have, such as high cholesterol or diabetes. Monitoring  Monitor your blood pressure at home as told by your health care provider. Your personal target blood pressure may vary depending on your medical conditions, your age, and other factors.  Have your blood pressure checked regularly, as often as told by your health care provider. Working with your health care provider  Review all the medicines you take with your health care provider because there may be side effects or interactions.  Talk with your health care provider about your diet, exercise habits, and other lifestyle factors that may be contributing to hypertension.  Visit your health care provider regularly. Your health care provider can help you create and adjust your plan for managing hypertension. Will I need medicine to control my blood pressure? Your health care provider may prescribe medicine if lifestyle changes are not enough to get your blood pressure under control, and if:  Your systolic blood pressure is 130 or higher.  Your diastolic blood pressure is 80 or higher. Take medicines only as told by your health care provider. Follow the directions carefully. Blood pressure medicines must be taken as prescribed. The medicine does not work as well when you skip doses. Skipping doses also puts you at risk for problems. Contact a health care provider if:  You think you are having a reaction to medicines you have taken.  You have repeated (recurrent) headaches.  You feel dizzy.  You have swelling in your ankles.  You have trouble with your vision. Get help right away if:  You develop a severe headache or confusion.  You have unusual weakness or numbness, or you feel faint.  You have severe pain in your chest or abdomen.  You vomit repeatedly.  You have trouble breathing. Summary  Hypertension is when the force of blood pumping  through your arteries is too strong. If this condition is not controlled, it may put you at risk for serious complications.  Your personal target blood pressure may vary depending on your medical conditions, your age, and other factors. For most people, a normal blood pressure is less than 120/80.  Hypertension is managed by lifestyle changes, medicines, or both. Lifestyle changes include weight loss, eating a healthy, low-sodium diet, exercising more, and limiting alcohol. This information is not intended to replace advice given to you by your health care provider. Make sure you discuss any questions you have with your health care provider. Document Released: 09/16/2011 Document Revised: 04/15/2018 Document Reviewed: 11/20/2015 Elsevier Patient Education  2020 [employer]vier Inc.  

## 2018-10-05 NOTE — Assessment & Plan Note (Signed)
-  Recent LDL not at goal.  Zetia added and cardiology plans to recheck in 3 months.

## 2018-10-05 NOTE — Assessment & Plan Note (Signed)
-  TSH has been stable and she is asymptomatic.  Recheck at f/uin 6 months.

## 2018-10-05 NOTE — Progress Notes (Signed)
Diamond Hughes - 68 y.o. female MRN [id number]  Date of birth: [birth year]  Subjective Chief Complaint  Patient presents with  . Follow-up  . Hypertension    HPI Diamond Hughes is a 68 y.o. female with history of HTN, HLD, hypothyroidism and CAD here for f/u of HTN.    -HTN:  Current treatment with lisinopril, amlodipine and metoprolol.  She had been consuming a large amount of processed deli meats and has cut back on this significantly.  She denies headache, vision changes or chest pain.    -Hypothyroidism:  She has been on a stable dose of levothyroxine for several months.  Last TSH 03/2018 was normal. She denies symptoms at this time.   -CAD:  Continues to follow with cardiology.  Denies anginal symptoms.  Recent LDL of 85, zetia added to statin for goal of <70.  She remains on dual antiplatelet therapy.   ROS:  A comprehensive ROS was completed and negative except as noted per HPI  No Known Allergies  Past Medical History:  Diagnosis Date  . CAD (coronary artery disease)    a. 2000 s/p MI and RCA PCI [facility]);  b. 02/2016 NSTEMI/PCI: LM nl, LAD nl, LCX 100p (3.5x18 Resolute Onyx DES), OM1/3 small, RCA 70p aneurysmal, 68m/d ISR-->attempted PCI, could not cross w/ wire-->med rx, EF 45-50%.  . Hyperlipidemia   . Hypertension   . Ischemic cardiomyopathy    a. 02/2016 Echo: EF 45-50%, basal-mid inferior/posterior HK, Gr1 DD, triv MR, nl LA size, nl RV fxn.  . Normocytic anemia     Past Surgical History:  Procedure Laterality Date  . ANGIOPLASTY  2000  . CORONARY STENT INTERVENTION N/A 02/10/2016   Procedure: Coronary Stent Intervention;  Surgeon: Lyn Records, MD;  Location: [facility] INVASIVE CV LAB;  Service: Cardiovascular;  Laterality: N/A;  . LEFT HEART CATH AND CORONARY ANGIOGRAPHY N/A 02/10/2016   Procedure: Left Heart Cath and Coronary Angiography;  Surgeon: Lyn Records, MD;  Location: [facility] INVASIVE CV LAB;  Service: Cardiovascular;  Laterality: N/A;    Social History    Socioeconomic History  . Marital status: Married    Spouse name: Not on file  . Number of children: Not on file  . Years of education: Not on file  . Highest education level: Not on file  Occupational History  . Not on file  Social Needs  . Financial resource strain: Not on file  . Food insecurity    Worry: Not on file    Inability: Not on file  . Transportation needs    Medical: Not on file    Non-medical: Not on file  Tobacco Use  . Smoking status: Former Smoker    Packs/day: 0.20    Years: 15.00    Pack years: 3.00    Types: Cigarettes  . Smokeless tobacco: Never Used  Substance and Sexual Activity  . Alcohol use: Yes    Comment: 40oz beer  . Drug use: No  . Sexual activity: Yes    Birth control/protection: None  Lifestyle  . Physical activity    Days per week: Not on file    Minutes per session: Not on file  . Stress: Not on file  Relationships  . Social Musician on phone: Not on file    Gets together: Not on file    Attends religious service: Not on file    Active member of club or organization: Not on file    Attends meetings  of clubs or organizations: Not on file    Relationship status: Not on file  Other Topics Concern  . Not on file  Social History Narrative  . Not on file    Family History  Problem Relation Age of Onset  . Heart disease Mother   . Hyperlipidemia Mother   . Hypertension Mother   . Hyperlipidemia Sister   . Hypertension Sister   . Breast cancer Maternal Grandmother     Health Maintenance  Topic Date Due  . TETANUS/TDAP  05/03/1971  . COLONOSCOPY  05/03/2002  . DEXA SCAN  05/02/2017  . PNA vac Low Risk Adult (1 of 2 - PCV13) 05/02/2017  . INFLUENZA VACCINE  04/05/2019 (Originally 08/06/2018)  . MAMMOGRAM  11/20/2019  . Hepatitis C Screening  Completed     ----------------------------------------------------------------------------------------------------------------------------------------------------------------------------------------------------------------- Physical Exam BP 118/88 (BP Location: Left Arm)   Pulse 87   Temp 98.6 F (37 C)   Ht 5\' 3"  (1.6 m)   Wt 185 lb (83.9 kg)   SpO2 98%   BMI 32.77 kg/m   Physical Exam Constitutional:      Appearance: Normal appearance.  HENT:     Head: Normocephalic and atraumatic.     Mouth/Throat:     Mouth: Mucous membranes are moist.  Eyes:     General: No scleral icterus. Neck:     Musculoskeletal: Neck supple.  Cardiovascular:     Rate and Rhythm: Normal rate and regular rhythm.  Pulmonary:     Effort: Pulmonary effort is normal.     Breath sounds: Normal breath sounds.  Skin:    General: Skin is warm and dry.     Findings: No rash.  Neurological:     General: No focal deficit present.     Mental Status: She is alert.  Psychiatric:        Mood and Affect: Mood normal.        Behavior: Behavior normal.     ------------------------------------------------------------------------------------------------------------------------------------------------------------------------------------------------------------------- Assessment and Plan  HTN (hypertension) -BP well controlled with current medication.  Continue at current dose.   CAD (coronary artery disease) -Followed by cardiology.  Denies anginal symptoms.   Acquired hypothyroidism -TSH has been stable and she is asymptomatic.  Recheck at f/uin 6 months.   Hyperlipidemia -Recent LDL not at goal.  Zetia added and cardiology plans to recheck in 3 months.

## 2018-11-03 ENCOUNTER — Telehealth: Payer: Self-pay

## 2018-11-03 NOTE — Telephone Encounter (Signed)
Copied from [place] [id number]. Topic: General - Other >> Nov 03, 2018  8:19 AM Celene Kras A wrote: Reason for CRM: Pt called stating she picked up a supplement called coq10 to help with her heart. Pt would like to know if she can take that with her current meds. Please advise.

## 2018-11-03 NOTE — Telephone Encounter (Signed)
Informed patient that it was okay to take. Patient did not have any questions or concerns prior to call ending.

## 2018-11-03 NOTE — Telephone Encounter (Signed)
Yes, that should be ok for her to take.

## 2018-11-09 ENCOUNTER — Other Ambulatory Visit: Payer: Self-pay | Admitting: Family Medicine

## 2018-11-09 MED ORDER — LEVOTHYROXINE SODIUM 75 MCG PO TABS: 75.0000 ug | ORAL_TABLET | Freq: Every day | ORAL | 1 refills | Status: DC

## 2018-11-09 NOTE — Telephone Encounter (Signed)
Medication Refill - Medication:  levothyroxine (SYNTHROID, LEVOTHROID) 75 MCG tablet    Has the patient contacted their pharmacy? Yes advised to call PCP. Pt is requesting a 90 day supply.   Preferred Pharmacy (with phone number or street name):  [id number] - [place], [address] AT [address] [phone] (Phone) [phone] (Fax)     Agent: Please be advised that RX refills may take up to 3 business days. We ask that you follow-up with your pharmacy.

## 2018-11-21 ENCOUNTER — Other Ambulatory Visit: Payer: Medicare Other

## 2018-11-30 ENCOUNTER — Other Ambulatory Visit: Payer: Self-pay

## 2018-11-30 ENCOUNTER — Other Ambulatory Visit: Payer: Medicare Other | Admitting: *Deleted

## 2018-11-30 DIAGNOSIS — E785 Hyperlipidemia, unspecified: Secondary | ICD-10-CM | POA: Diagnosis not present

## 2018-11-30 LAB — HEPATIC FUNCTION PANEL
ALT: 21 IU/L (ref 0–32)
AST: 30 IU/L (ref 0–40)
Albumin: 4.7 g/dL (ref 3.8–4.8)
Alkaline Phosphatase: 63 IU/L (ref 39–117)
Bilirubin Total: 0.4 mg/dL (ref 0.0–1.2)
Bilirubin, Direct: 0.12 mg/dL (ref 0.00–0.40)
Total Protein: 8.2 g/dL (ref 6.0–8.5)

## 2018-11-30 LAB — LIPID PANEL
Chol/HDL Ratio: 2.2 ratio (ref 0.0–4.4)
Cholesterol, Total: 161 mg/dL (ref 100–199)
HDL: 72 mg/dL (ref >39)
LDL Chol Calc (NIH): 70 mg/dL (ref 0–99)
Triglycerides: 109 mg/dL (ref 0–149)
VLDL Cholesterol Cal: 19 mg/dL (ref 5–40)

## 2018-12-05 ENCOUNTER — Other Ambulatory Visit: Payer: Self-pay | Admitting: Family Medicine

## 2018-12-05 DIAGNOSIS — I10 Essential (primary) hypertension: Secondary | ICD-10-CM

## 2018-12-05 MED ORDER — AMLODIPINE BESYLATE 5 MG PO TABS: 5.0000 mg | ORAL_TABLET | Freq: Every day | ORAL | 0 refills | Status: DC

## 2018-12-05 MED ORDER — METOPROLOL TARTRATE 50 MG PO TABS: 50.0000 mg | ORAL_TABLET | Freq: Two times a day (BID) | ORAL | 1 refills | Status: DC

## 2018-12-05 MED ORDER — LISINOPRIL 20 MG PO TABS: 20.0000 mg | ORAL_TABLET | Freq: Every day | ORAL | 0 refills | Status: DC

## 2018-12-05 NOTE — Telephone Encounter (Signed)
Medication Refill - Medication: lisinopril (ZESTRIL) 20 MG tablet amLODipine (NORVASC) 5 MG tablet metoprolol tartrate (LOPRESSOR) 50 MG tablet  Has the patient contacted their pharmacy? No. (Agent: If no, request that the patient contact the pharmacy for the refill.) (Agent: If yes, when and what did the pharmacy advise?)  Preferred Pharmacy (with phone number or street name): [facility] [address]  New pharmacy    Agent: Please be advised that RX refills may take up to 3 business days. We ask that you follow-up with your pharmacy.

## 2019-01-12 ENCOUNTER — Telehealth: Payer: Self-pay | Admitting: Family Medicine

## 2019-01-12 NOTE — Telephone Encounter (Signed)
Patient is requesting to [facility] from Dr. Ashley Royalty to [facility] due to him leaving the practice. Pls Advise.

## 2019-01-12 NOTE — Telephone Encounter (Signed)
ok 

## 2019-01-12 NOTE — Telephone Encounter (Signed)
Pt will call back later when she wants to set up TOC date

## 2019-01-24 NOTE — Progress Notes (Signed)
Virtual Visit via Audio Note  I connected with patient on 01/25/19 at  8:45 AM EST by audio enabled telemedicine application and verified that I am speaking with the correct person using two identifiers.   THIS ENCOUNTER IS A VIRTUAL VISIT DUE TO COVID-19 - PATIENT WAS NOT SEEN IN THE OFFICE. PATIENT HAS CONSENTED TO VIRTUAL VISIT / TELEMEDICINE VISIT   Location of patient: home  Location of provider: office  I discussed the limitations of evaluation and management by telemedicine and the availability of in person appointments. The patient expressed understanding and agreed to proceed.   Subjective:   Diamond Hughes is a 69 y.o. female who presents for an Initial Medicare Annual Wellness Visit.  The Patient was informed that the wellness visit is to identify future health risk and educate and initiate measures that can reduce risk for increased disease through the lifespan.   Describes health as fair, good or great? Great!   Review of Systems   Home Safety/Smoke Alarms: Feels safe in home. Smoke alarms in place.  Lives in 3rd floor apt with husband. No trouble w/ stairs.  Female:    Mammo-11/15/17. ordered      Dexa scan- declines. Will discuss w/ PCP        CCS-Cologuard 11/01/17-negative     Objective:    Today's Vitals   01/25/19 0849  BP: 125/78   There is no height or weight on file to calculate BMI.  Advanced Directives 01/25/2019 02/13/2016 02/08/2016 02/02/2016  Does Patient Have a Medical Advance Directive? No No No No  Would patient like information on creating a medical advance directive? No - Patient declined No - Patient declined No - Patient declined -    Current Medications (verified) Outpatient Encounter Medications as of 01/25/2019  Medication Sig  . amLODipine (NORVASC) 5 MG tablet Take 1 tablet (5 mg total) by mouth daily.  Marland Kitchen aspirin 81 MG tablet Take 1 tablet (81 mg total) by mouth daily.  Marland Kitchen atorvastatin (LIPITOR) 80 MG tablet Take 1 tablet (80 mg  total) by mouth daily at 6 PM.  . clopidogrel (PLAVIX) 75 MG tablet Take 1 tablet by mouth once daily with breakfast  . ezetimibe (ZETIA) 10 MG tablet Take 1 tablet (10 mg total) by mouth daily.  Marland Kitchen levothyroxine (SYNTHROID) 75 MCG tablet Take 1 tablet (75 mcg total) by mouth daily.  Marland Kitchen lisinopril (ZESTRIL) 20 MG tablet Take 1 tablet (20 mg total) by mouth daily.  . metoprolol tartrate (LOPRESSOR) 50 MG tablet Take 1 tablet (50 mg total) by mouth 2 (two) times daily.  . nitroGLYCERIN (NITROSTAT) 0.4 MG SL tablet Place 1 tablet (0.4 mg total) under the tongue every 5 (five) minutes x 3 doses as needed for chest pain. (Patient not taking: Reported on 10/05/2018)   No facility-administered encounter medications on file as of 01/25/2019.    Allergies (verified) Patient has no known allergies.   History: Past Medical History:  Diagnosis Date  . CAD (coronary artery disease)    a. 2000 s/p MI and RCA PCI [facility]);  b. 02/2016 NSTEMI/PCI: LM nl, LAD nl, LCX 100p (3.5x18 Resolute Onyx DES), OM1/3 small, RCA 70p aneurysmal, 80m/d ISR-->attempted PCI, could not cross w/ wire-->med rx, EF 45-50%.  . Hyperlipidemia   . Hypertension   . Ischemic cardiomyopathy    a. 02/2016 Echo: EF 45-50%, basal-mid inferior/posterior HK, Gr1 DD, triv MR, nl LA size, nl RV fxn.  . Normocytic anemia    Past Surgical History:  Procedure Laterality Date  . ANGIOPLASTY  2000  . CORONARY STENT INTERVENTION N/A 02/10/2016   Procedure: Coronary Stent Intervention;  Surgeon: Lyn Records, MD;  Location: [facility] INVASIVE CV LAB;  Service: Cardiovascular;  Laterality: N/A;  . LEFT HEART CATH AND CORONARY ANGIOGRAPHY N/A 02/10/2016   Procedure: Left Heart Cath and Coronary Angiography;  Surgeon: Lyn Records, MD;  Location: [facility] INVASIVE CV LAB;  Service: Cardiovascular;  Laterality: N/A;   Family History  Problem Relation Age of Onset  . Heart disease Mother   . Hyperlipidemia Mother   . Hypertension Mother   . Hyperlipidemia  Sister   . Hypertension Sister   . Breast cancer Maternal Grandmother    Social History   Socioeconomic History  . Marital status: Married    Spouse name: Not on file  . Number of children: Not on file  . Years of education: Not on file  . Highest education level: Not on file  Occupational History  . Not on file  Tobacco Use  . Smoking status: Former Smoker    Packs/day: 0.20    Years: 15.00    Pack years: 3.00    Types: Cigarettes  . Smokeless tobacco: Never Used  Substance and Sexual Activity  . Alcohol use: Yes    Comment: 40oz beer  . Drug use: No  . Sexual activity: Yes    Birth control/protection: None  Other Topics Concern  . Not on file  Social History Narrative  . Not on file   Social Determinants of Health   Financial Resource Strain:   . Difficulty of Paying Living Expenses: Not on file  Food Insecurity:   . Worried About Programme researcher, broadcasting/film/video in the Last Year: Not on file  . Ran Out of Food in the Last Year: Not on file  Transportation Needs:   . Lack of Transportation (Medical): Not on file  . Lack of Transportation (Non-Medical): Not on file  Physical Activity:   . Days of Exercise per Week: Not on file  . Minutes of Exercise per Session: Not on file  Stress:   . Feeling of Stress : Not on file  Social Connections:   . Frequency of Communication with Friends and Family: Not on file  . Frequency of Social Gatherings with Friends and Family: Not on file  . Attends Religious Services: Not on file  . Active Member of Clubs or Organizations: Not on file  . Attends Banker Meetings: Not on file  . Marital Status: Not on file    Tobacco Counseling Counseling given: Not Answered   Clinical Intake:  Pain : No/denies pain     Activities of Daily Living In your present state of health, do you have any difficulty performing the following activities: 01/25/2019  Hearing? N  Vision? N  Difficulty concentrating or making decisions? N    Walking or climbing stairs? N  Dressing or bathing? N  Doing errands, shopping? N  Preparing Food and eating ? N  Using the Toilet? N  In the past six months, have you accidently leaked urine? N  Do you have problems with loss of bowel control? N  Managing your Medications? N  Managing your Finances? N  Housekeeping or managing your Housekeeping? N  Some recent data might be hidden     Immunizations and Health Maintenance  There is no immunization history on file for this patient. Health Maintenance Due  Topic Date Due  . TETANUS/TDAP  05/03/1971  .  DEXA SCAN  05/02/2017  . PNA vac Low Risk Adult (1 of 2 - PCV13) 05/02/2017    Patient Care Team: Everrett Coombe, DO as PCP - General (Family Medicine) Kathleene Hazel, MD as PCP - Cardiology (Cardiology)  Indicate any recent Medical Services you may have received from other than [facility] providers in the past year (date may be approximate).     Assessment:   This is a routine wellness examination for Auset. Physical assessment deferred to PCP.  Hearing/Vision screen Unable to assess. This visit is enabled though telemedicine due to Covid 19.   Dietary issues and exercise activities discussed: Current Exercise Habits: The patient does not participate in regular exercise at present, Exercise limited by: None identified Diet (meal preparation, eat out, water intake, caffeinated beverages, dairy products, fruits and vegetables): 24 hr recall Breakfast: activia Lunch: ham and cheese sandwich, BBQ pork skins Dinner:    Cookies, beef jerky  Goals    . Increase physical activity     3x/ week for 30 min.  (Use my 5 min rule)      Depression Screen PHQ 2/9 Scores 01/25/2019 03/18/2018 08/09/2017 02/13/2016  PHQ - 2 Score 0 0 0 0    Fall Risk Fall Risk  01/25/2019 03/18/2018 08/09/2017  Falls in the past year? 0 0 No  Number falls in past yr: 0 - -  Injury with Fall? 0 - -  Follow up Education provided;Falls prevention  discussed - -   Cognitive Function: Ad8 score reviewed for issues:  Issues making decisions:no  Less interest in hobbies / activities:no  Repeats questions, stories (family complaining):no  Trouble using ordinary gadgets (microwave, computer, phone):no  Forgets the month or year: no  Mismanaging finances: no  Remembering appts:no  Daily problems with thinking and/or memory:no Ad8 score is=0        Screening Tests Health Maintenance  Topic Date Due  . TETANUS/TDAP  05/03/1971  . DEXA SCAN  05/02/2017  . PNA vac Low Risk Adult (1 of 2 - PCV13) 05/02/2017  . INFLUENZA VACCINE  04/05/2019 (Originally 08/06/2018)  . MAMMOGRAM  11/20/2019  . COLONOSCOPY  11/02/2027  . Hepatitis C Screening  Completed     Plan:    Please schedule your next medicare wellness visit with me in 1 yr.  Continue to eat heart healthy diet (full of fruits, vegetables, whole grains, lean protein, water--limit salt, fat, and sugar intake) and increase physical activity as tolerated.  Continue doing brain stimulating activities (puzzles, reading, adult coloring books, staying active) to keep memory sharp.   Bring a copy of your living will and/or healthcare power of attorney to your next office visit.   I have personally reviewed and noted the following in the patient's chart:   . Medical and social history . Use of alcohol, tobacco or illicit drugs  . Current medications and supplements . Functional ability and status . Nutritional status . Physical activity . Advanced directives . List of other physicians . Hospitalizations, surgeries, and ER visits in previous 12 months . Vitals . Screenings to include cognitive, depression, and falls . Referrals and appointments  In addition, I have reviewed and discussed with patient certain preventive protocols, quality metrics, and best practice recommendations. A written personalized care plan for preventive services as well as general preventive  health recommendations were provided to patient.     [place]on Gully, [place]   01/25/2019

## 2019-01-25 ENCOUNTER — Encounter: Payer: Self-pay | Admitting: *Deleted

## 2019-01-25 ENCOUNTER — Ambulatory Visit (INDEPENDENT_AMBULATORY_CARE_PROVIDER_SITE_OTHER): Payer: Medicare Other | Admitting: *Deleted

## 2019-01-25 VITALS — BP 125/78

## 2019-01-25 DIAGNOSIS — Z0000 Encounter for general adult medical examination without abnormal findings: Secondary | ICD-10-CM | POA: Diagnosis not present

## 2019-01-25 NOTE — Patient Instructions (Addendum)
Please schedule your next medicare wellness visit with me in 1 yr.  Continue to eat heart healthy diet (full of fruits, vegetables, whole grains, lean protein, water--limit salt, fat, and sugar intake) and increase physical activity as tolerated.  Continue doing brain stimulating activities (puzzles, reading, adult coloring books, staying active) to keep memory sharp.   Bring a copy of your living will and/or healthcare power of attorney to your next office visit.   Diamond Hughes , Thank you for taking time to come for your Medicare Wellness Visit. I appreciate your ongoing commitment to your health goals. Please review the following plan we discussed and let me know if I can assist you in the future.   These are the goals we discussed: Goals    . DIET - INCREASE WATER INTAKE    . Increase physical activity     3x/ week for 30 min.  (Use my 5 min rule)       This is a list of the screening recommended for you and due dates:  Health Maintenance  Topic Date Due  . Tetanus Vaccine  05/03/1971  . DEXA scan (bone density measurement)  05/02/2017  . Pneumonia vaccines (1 of 2 - PCV13) 05/02/2017  . Flu Shot  04/05/2019*  . Mammogram  11/20/2019  . Colon Cancer Screening  11/02/2027  .  Hepatitis C: One time screening is recommended by Center for Disease Control  (CDC) for  adults born from [birth year] through 1963.   Completed  *Topic was postponed. The date shown is not the original due date.     Preventive Care 77 Years and Older, Female Preventive care refers to lifestyle choices and visits with your health care provider that can promote health and wellness. This includes:  A yearly physical exam. This is also called an annual well check.  Regular dental and eye exams.  Immunizations.  Screening for certain conditions.  Healthy lifestyle choices, such as diet and exercise. What can I expect for my preventive care visit? Physical exam Your health care provider will  check:  Height and weight. These may be used to calculate body mass index (BMI), which is a measurement that tells if you are at a healthy weight.  Heart rate and blood pressure.  Your skin for abnormal spots. Counseling Your health care provider may ask you questions about:  Alcohol, tobacco, and drug use.  Emotional well-being.  Home and relationship well-being.  Sexual activity.  Eating habits.  History of falls.  Memory and ability to understand (cognition).  Work and work Statistician.  Pregnancy and menstrual history. What immunizations do I need?  Influenza (flu) vaccine  This is recommended every year. Tetanus, diphtheria, and pertussis (Tdap) vaccine  You may need a Td booster every 10 years. Varicella (chickenpox) vaccine  You may need this vaccine if you have not already been vaccinated. Zoster (shingles) vaccine  You may need this after age 86. Pneumococcal conjugate (PCV13) vaccine  One dose is recommended after age 19. Pneumococcal polysaccharide (PPSV23) vaccine  One dose is recommended after age 40. Measles, mumps, and rubella (MMR) vaccine  You may need at least one dose of MMR if you were born in 1957 or later. You may also need a second dose. Meningococcal conjugate (MenACWY) vaccine  You may need this if you have certain conditions. Hepatitis A vaccine  You may need this if you have certain conditions or if you travel or work in places where you may be exposed to hepatitis  A. Hepatitis B vaccine  You may need this if you have certain conditions or if you travel or work in places where you may be exposed to hepatitis B. Haemophilus influenzae type b (Hib) vaccine  You may need this if you have certain conditions. You may receive vaccines as individual doses or as more than one vaccine together in one shot (combination vaccines). Talk with your health care provider about the risks and benefits of combination vaccines. What tests do I  need? Blood tests  Lipid and cholesterol levels. These may be checked every 5 years, or more frequently depending on your overall health.  Hepatitis C test.  Hepatitis B test. Screening  Lung cancer screening. You may have this screening every year starting at age 8 if you have a 30-pack-year history of smoking and currently smoke or have quit within the past 15 years.  Colorectal cancer screening. All adults should have this screening starting at age 107 and continuing until age 20. Your health care provider may recommend screening at age 80 if you are at increased risk. You will have tests every 1-10 years, depending on your results and the type of screening test.  Diabetes screening. This is done by checking your blood sugar (glucose) after you have not eaten for a while (fasting). You may have this done every 1-3 years.  Mammogram. This may be done every 1-2 years. Talk with your health care provider about how often you should have regular mammograms.  BRCA-related cancer screening. This may be done if you have a family history of breast, ovarian, tubal, or peritoneal cancers. Other tests  Sexually transmitted disease (STD) testing.  Bone density scan. This is done to screen for osteoporosis. You may have this done starting at age 13. Follow these instructions at home: Eating and drinking  Eat a diet that includes fresh fruits and vegetables, whole grains, lean protein, and low-fat dairy products. Limit your intake of foods with high amounts of sugar, saturated fats, and salt.  Take vitamin and mineral supplements as recommended by your health care provider.  Do not drink alcohol if your health care provider tells you not to drink.  If you drink alcohol: ? Limit how much you have to 0-1 drink a day. ? Be aware of how much alcohol is in your drink. In the [place] one drink equals one 12 oz bottle of beer (355 mL), one 5 oz glass of wine (148 mL), or one 1 oz glass of hard liquor  (44 mL). Lifestyle  Take daily care of your teeth and gums.  Stay active. Exercise for at least 30 minutes on 5 or more days each week.  Do not use any products that contain nicotine or tobacco, such as cigarettes, e-cigarettes, and chewing tobacco. If you need help quitting, ask your health care provider.  If you are sexually active, practice safe sex. Use a condom or other form of protection in order to prevent STIs (sexually transmitted infections).  Talk with your health care provider about taking a low-dose aspirin or statin. What's next?  Go to your health care provider once a year for a well check visit.  Ask your health care provider how often you should have your eyes and teeth checked.  Stay up to date on all vaccines. This information is not intended to replace advice given to you by your health care provider. Make sure you discuss any questions you have with your health care provider. Document Revised: 12/16/2017 Document Reviewed: 12/16/2017  Elsevier Patient Education  [employer].

## 2019-01-31 ENCOUNTER — Other Ambulatory Visit: Payer: Self-pay

## 2019-02-01 ENCOUNTER — Ambulatory Visit (INDEPENDENT_AMBULATORY_CARE_PROVIDER_SITE_OTHER): Payer: Medicare Other | Admitting: Family Medicine

## 2019-02-01 ENCOUNTER — Encounter: Payer: Self-pay | Admitting: Family Medicine

## 2019-02-01 DIAGNOSIS — M5430 Sciatica, unspecified side: Secondary | ICD-10-CM

## 2019-02-01 NOTE — Patient Instructions (Signed)
Sciatica Rehab Ask your health care provider which exercises are safe for you. Do exercises exactly as told by your health care provider and adjust them as directed. It is normal to feel mild stretching, pulling, tightness, or discomfort as you do these exercises. Stop right away if you feel sudden pain or your pain gets worse. Do not begin these exercises until told by your health care provider. Stretching and range-of-motion exercises These exercises warm up your muscles and joints and improve the movement and flexibility of your hips and back. These exercises also help to relieve pain, numbness, and tingling. Sciatic nerve glide 1. Sit in a chair with your head facing down toward your chest. Place your hands behind your back. Let your shoulders slump forward. 2. Slowly straighten one of your legs while you tilt your head back as if you are looking toward the ceiling. Only straighten your leg as far as you can without making your symptoms worse. 3. Hold this position for __________ seconds. 4. Slowly return to the starting position. 5. Repeat with your other leg. Repeat __________ times. Complete this exercise __________ times a day. Knee to chest with hip adduction and internal rotation  1. Lie on your back on a firm surface with both legs straight. 2. Bend one of your knees and move it up toward your chest until you feel a gentle stretch in your lower back and buttock. Then, move your knee toward the shoulder that is on the opposite side from your leg. This is hip adduction and internal rotation. ? Hold your leg in this position by holding on to the front of your knee. 3. Hold this position for __________ seconds. 4. Slowly return to the starting position. 5. Repeat with your other leg. Repeat __________ times. Complete this exercise __________ times a day. Prone extension on elbows  1. Lie on your abdomen on a firm surface. A bed may be too soft for this exercise. 2. Prop yourself up on  your elbows. 3. Use your arms to help lift your chest up until you feel a gentle stretch in your abdomen and your lower back. ? This will place some of your body weight on your elbows. If this is uncomfortable, try stacking pillows under your chest. ? Your hips should stay down, against the surface that you are lying on. Keep your hip and back muscles relaxed. 4. Hold this position for __________ seconds. 5. Slowly relax your upper body and return to the starting position. Repeat __________ times. Complete this exercise __________ times a day. Strengthening exercises These exercises build strength and endurance in your back. Endurance is the ability to use your muscles for a long time, even after they get tired. Pelvic tilt This exercise strengthens the muscles that lie deep in the abdomen. 1. Lie on your back on a firm surface. Bend your knees and keep your feet flat on the floor. 2. Tense your abdominal muscles. Tip your pelvis up toward the ceiling and flatten your lower back into the floor. ? To help with this exercise, you may place a small towel under your lower back and try to push your back into the towel. 3. Hold this position for __________ seconds. 4. Let your muscles relax completely before you repeat this exercise. Repeat __________ times. Complete this exercise __________ times a day. Alternating arm and leg raises  1. Get on your hands and knees on a firm surface. If you are on a hard floor, you may want to use   padding, such as an exercise mat, to cushion your knees. 2. Line up your arms and legs. Your hands should be directly below your shoulders, and your knees should be directly below your hips. 3. Lift your left leg behind you. At the same time, raise your right arm and straighten it in front of you. ? Do not lift your leg higher than your hip. ? Do not lift your arm higher than your shoulder. ? Keep your abdominal and back muscles tight. ? Keep your hips facing the  ground. ? Do not arch your back. ? Keep your balance carefully, and do not hold your breath. 4. Hold this position for __________ seconds. 5. Slowly return to the starting position. 6. Repeat with your right leg and your left arm. Repeat __________ times. Complete this exercise __________ times a day. Posture and body mechanics Good posture and healthy body mechanics can help to relieve stress in your body's tissues and joints. Body mechanics refers to the movements and positions of your body while you do your daily activities. Posture is part of body mechanics. Good posture means:  Your spine is in its natural S-curve position (neutral).  Your shoulders are pulled back slightly.  Your head is not tipped forward. Follow these guidelines to improve your posture and body mechanics in your everyday activities. Standing   When standing, keep your spine neutral and your feet about hip width apart. Keep a slight bend in your knees. Your ears, shoulders, and hips should line up.  When you do a task in which you stand in one place for a long time, place one foot up on a stable object that is 2-4 inches (5-10 cm) high, such as a footstool. This helps keep your spine neutral. Sitting   When sitting, keep your spine neutral and keep your feet flat on the floor. Use a footrest, if necessary, and keep your thighs parallel to the floor. Avoid rounding your shoulders, and avoid tilting your head forward.  When working at a desk or a computer, keep your desk at a height where your hands are slightly lower than your elbows. Slide your chair under your desk so you are close enough to maintain good posture.  When working at a computer, place your monitor at a height where you are looking straight ahead and you do not have to tilt your head forward or downward to look at the screen. Resting  When lying down and resting, avoid positions that are most painful for you.  If you have pain with activities  such as sitting, bending, stooping, or squatting, lie in a position in which your body does not bend very much. For example, avoid curling up on your side with your arms and knees near your chest (fetal position).  If you have pain with activities such as standing for a long time or reaching with your arms, lie with your spine in a neutral position and bend your knees slightly. Try the following positions: ? Lying on your side with a pillow between your knees. ? Lying on your back with a pillow under your knees. Lifting   When lifting objects, keep your feet at least shoulder width apart and tighten your abdominal muscles.  Bend your knees and hips and keep your spine neutral. It is important to lift using the strength of your legs, not your back. Do not lock your knees straight out.  Always ask for help to lift heavy or awkward objects. This information is not   intended to replace advice given to you by your health care provider. Make sure you discuss any questions you have with your health care provider. Document Revised: 04/15/2018 Document Reviewed: 01/13/2018 Elsevier Patient Education  2020 [employer]  

## 2019-02-01 NOTE — Progress Notes (Signed)
Diamond Hughes - 69 y.o. female MRN [id number]  Date of birth: [birth year]  Subjective Chief Complaint  Patient presents with  . Leg Pain    Pt c/o leg pain, back of both legs, starting from buttock dow to the back of thighs. x 2 weeks now.  Pain is kind of throbbing consistant pain and sometimes there is a sharpe pain that runs through.  Pt thinks that it is from inactivity,  she sits a lot.    HPI Diamond Hughes is a 69 y.o. female here today with complaint of leg pain.  She reports that she has had intermittent episodes of pain that radiates from her buttocks down the back of each leg.  This tends to worsen if she is sitting for longer periods of time.  It improves with walking and movement.  She denies associated swelling, numbness, tingling or weakness.  Denies back pain.    ROS:  A comprehensive ROS was completed and negative except as noted per HPI  No Known Allergies  Past Medical History:  Diagnosis Date  . CAD (coronary artery disease)    a. 2000 s/p MI and RCA PCI [facility]);  b. 02/2016 NSTEMI/PCI: LM nl, LAD nl, LCX 100p (3.5x18 Resolute Onyx DES), OM1/3 small, RCA 70p aneurysmal, 71m/d ISR-->attempted PCI, could not cross w/ wire-->med rx, EF 45-50%.  . Hyperlipidemia   . Hypertension   . Ischemic cardiomyopathy    a. 02/2016 Echo: EF 45-50%, basal-mid inferior/posterior HK, Gr1 DD, triv MR, nl LA size, nl RV fxn.  . Normocytic anemia     Past Surgical History:  Procedure Laterality Date  . ANGIOPLASTY  2000  . CORONARY STENT INTERVENTION N/A 02/10/2016   Procedure: Coronary Stent Intervention;  Surgeon: Belva Crome, MD;  Location: [facility];  Service: Cardiovascular;  Laterality: N/A;  . LEFT HEART CATH AND CORONARY ANGIOGRAPHY N/A 02/10/2016   Procedure: Left Heart Cath and Coronary Angiography;  Surgeon: Belva Crome, MD;  Location: [facility];  Service: Cardiovascular;  Laterality: N/A;    Social History   Socioeconomic History  . Marital  status: Married    Spouse name: Not on file  . Number of children: Not on file  . Years of education: Not on file  . Highest education level: Not on file  Occupational History  . Not on file  Tobacco Use  . Smoking status: Former Smoker    Packs/day: 0.20    Years: 15.00    Pack years: 3.00    Types: Cigarettes  . Smokeless tobacco: Never Used  Substance and Sexual Activity  . Alcohol use: Yes    Comment: 40oz beer  . Drug use: No  . Sexual activity: Yes    Birth control/protection: None  Other Topics Concern  . Not on file  Social History Narrative  . Not on file   Social Determinants of Health   Financial Resource Strain:   . Difficulty of Paying Living Expenses: Not on file  Food Insecurity:   . Worried About Charity fundraiser in the Last Year: Not on file  . Ran Out of Food in the Last Year: Not on file  Transportation Needs:   . Lack of Transportation (Medical): Not on file  . Lack of Transportation (Non-Medical): Not on file  Physical Activity:   . Days of Exercise per Week: Not on file  . Minutes of Exercise per Session: Not on file  Stress:   . Feeling  of Stress : Not on file  Social Connections:   . Frequency of Communication with Friends and Family: Not on file  . Frequency of Social Gatherings with Friends and Family: Not on file  . Attends Religious Services: Not on file  . Active Member of Clubs or Organizations: Not on file  . Attends Banker Meetings: Not on file  . Marital Status: Not on file    Family History  Problem Relation Age of Onset  . Heart disease Mother   . Hyperlipidemia Mother   . Hypertension Mother   . Hyperlipidemia Sister   . Hypertension Sister   . Breast cancer Maternal Grandmother     Health Maintenance  Topic Date Due  . TETANUS/TDAP  05/03/1971  . DEXA SCAN  05/02/2017  . PNA vac Low Risk Adult (1 of 2 - PCV13) 05/02/2017  . INFLUENZA VACCINE  04/05/2019 (Originally 08/06/2018)  . MAMMOGRAM  11/20/2019   . COLONOSCOPY  11/02/2027  . Hepatitis C Screening  Completed    ----------------------------------------------------------------------------------------------------------------------------------------------------------------------------------------------------------------- Physical Exam BP 140/80 (BP Location: Left Arm, Patient Position: Sitting, Cuff Size: Normal)   Pulse 78   Temp (!) 96.5 F (35.8 C) (Temporal)   Ht 5\' 3"  (1.6 m)   Wt 189 lb 6.4 oz (85.9 kg)   SpO2 98%   BMI 33.55 kg/m   Physical Exam Constitutional:      Appearance: Normal appearance.  HENT:     Head: Normocephalic and atraumatic.     Mouth/Throat:     Mouth: Mucous membranes are dry.  Cardiovascular:     Rate and Rhythm: Normal rate and regular rhythm.  Pulmonary:     Effort: Pulmonary effort is normal.     Breath sounds: Normal breath sounds.  Skin:    General: Skin is warm and dry.  Neurological:     General: No focal deficit present.     Mental Status: She is alert.     Motor: No weakness.     Gait: Gait normal.     Deep Tendon Reflexes: Reflexes normal.  Psychiatric:        Mood and Affect: Mood normal.        Behavior: Behavior normal.     ------------------------------------------------------------------------------------------------------------------------------------------------------------------------------------------------------------------- Assessment and Plan  Sciatica without lumbago Given handout with HEP to help with this.  Discussed icing/heat as needed for comfort.  Increase activity throughout the day.  Call for development of new or worsening symptoms.     This visit occurred during the SARS-CoV-2 public health emergency.  Safety protocols were in place, including screening questions prior to the visit, additional usage of staff PPE, and extensive cleaning of exam room while observing appropriate contact time as indicated for disinfecting solutions.

## 2019-02-01 NOTE — Assessment & Plan Note (Signed)
Given handout with HEP to help with this.  Discussed icing/heat as needed for comfort.  Increase activity throughout the day.  Call for development of new or worsening symptoms.

## 2019-02-07 ENCOUNTER — Telehealth: Payer: Self-pay | Admitting: Physician Assistant

## 2019-02-07 NOTE — Telephone Encounter (Signed)
Patient wants to know if it is safe for her to get the COVID vaccine. She had an adverse reaction to her recent flu shot and is on blood thinners. She was told to get the OK from the cardiologist before she got the vaccine.  She is scheduled to get her first dose this Saturday 02-06, and would like to know if she should keep the appointment for the shot

## 2019-02-07 NOTE — Telephone Encounter (Signed)
We are recommending the COVID-19 vaccine to all of our patients. Cardiac medications (including blood thinners) should not deter anyone from being vaccinated and there is no need to hold any of those medications prior to vaccine administration.     Informed the pt of note as mentioned above about receiving the covid vaccine and taking blood thinners Pt verbalized understanding and agrees with this plan.

## 2019-03-03 ENCOUNTER — Other Ambulatory Visit: Payer: Self-pay | Admitting: Family Medicine

## 2019-03-03 ENCOUNTER — Telehealth: Payer: Self-pay | Admitting: General Practice

## 2019-03-03 DIAGNOSIS — I10 Essential (primary) hypertension: Secondary | ICD-10-CM

## 2019-03-03 NOTE — Telephone Encounter (Signed)
Patient is calling and stated that she needs PA for her amlodipine. Patient is going to call back to set up TOC with [facility]. Pls advise. CB is [phone]

## 2019-03-06 NOTE — Telephone Encounter (Signed)
Sent in refills that were sent over from the pharmacy to last till pt is seen by new PCP in office/thx dmf

## 2019-04-12 NOTE — Progress Notes (Signed)
Cardiology Office Note    Date:  04/19/2019   ID:  Diamond Hughes, DOB Feb 24, 1950, MRN [id number]  PCP:  Luetta Nutting, DO  Cardiologist: Lauree Chandler, MD EPS: None  No chief complaint on file.   History of Present Illness:  Diamond Hughes is a 69 y.o. female has a history of CAD status post MI treated with stent to the RCA in 2000 in [place].  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  She had DES to the circumflex and failed angioplasty to the RCA in-stent restenosis and distal de novo disease due to inability to cross the balloon.  Medical therapy recommended for the RCA lesion.  2D echo 02/2017 showed LVEF 45 to 50% with basal to mid inferior posterior hypokinesis and grade 1 DD.  Frequent PVCs on prior EKGs and was advised to reduce caffeine and  alcohol consumption.   Follow up echo 08/2018 normal LVEF. I had a telemedicine visit 04/2018 and she was doing well.  Patient comes in for f/u. Worried about weight gain, and complains of DOE since she hasn't been exercising. Unable to go to the gym.Denies chest pain. received her covid19 vaccine. Has had some legs swelling but has been eating ham sandwiches. She knows she needs to cut back.     Past Medical History:  Diagnosis Date  . CAD (coronary artery disease)    a. 2000 s/p MI and RCA PCI [facility]);  b. 02/2016 NSTEMI/PCI: LM nl, LAD nl, LCX 100p (3.5x18 Resolute Onyx DES), OM1/3 small, RCA 70p aneurysmal, 93md ISR-->attempted PCI, could not cross w/ wire-->med rx, EF 45-50%.  . Hyperlipidemia   . Hypertension   . Ischemic cardiomyopathy    a. 02/2016 Echo: EF 45-50%, basal-mid inferior/posterior HK, Gr1 DD, triv MR, nl LA size, nl RV fxn.  . Normocytic anemia     Past Surgical History:  Procedure Laterality Date  . ANGIOPLASTY  2000  .  CORONARY STENT INTERVENTION N/A 02/10/2016   Procedure: Coronary Stent Intervention;  Surgeon: HBelva Crome MD;  [facility];  Service: Cardiovascular;  Laterality: N/A;  . LEFT HEART CATH AND CORONARY ANGIOGRAPHY N/A 02/10/2016   Procedure: Left Heart Cath and Coronary Angiography;  Surgeon: HBelva Crome MD;  [facility];  Service: Cardiovascular;  Laterality: N/A;    Current Medications: Current Meds  Medication Sig  . amLODipine (NORVASC) 5 MG tablet TAKE 1 TABLET(5 MG) BY MOUTH DAILY  . aspirin 81 MG tablet Take 1 tablet (81 mg total) by mouth daily.  .Marland Kitchenatorvastatin (LIPITOR) 80 MG tablet Take 1 tablet (80 mg total) by mouth daily at 6 PM.  . clopidogrel (PLAVIX) 75 MG tablet Take 1 tablet by mouth once daily with breakfast  . ezetimibe (ZETIA) 10 MG tablet Take 1 tablet (10 mg total) by mouth daily.  .Marland Kitchenlevothyroxine (SYNTHROID) 75 MCG tablet Take 1 tablet (75 mcg total) by mouth daily.  .Marland Kitchenlisinopril (ZESTRIL) 20 MG tablet TAKE 1 TABLET(20 MG) BY MOUTH DAILY  . metoprolol tartrate (LOPRESSOR) 50 MG tablet Take 1 tablet (50 mg total) by mouth 2 (two) times daily.  . nitroGLYCERIN (NITROSTAT) 0.4 MG SL tablet Place 1 tablet (0.4 mg total) under the tongue every 5 (five) minutes x 3 doses as needed for chest pain.  . [  DISCONTINUED] amLODipine (NORVASC) 5 MG tablet TAKE 1 TABLET(5 MG) BY MOUTH DAILY  . [DISCONTINUED] atorvastatin (LIPITOR) 80 MG tablet Take 1 tablet (80 mg total) by mouth daily at 6 PM.  . [DISCONTINUED] clopidogrel (PLAVIX) 75 MG tablet Take 1 tablet by mouth once daily with breakfast  . [DISCONTINUED] ezetimibe (ZETIA) 10 MG tablet Take 1 tablet (10 mg total) by mouth daily.  . [DISCONTINUED] lisinopril (ZESTRIL) 20 MG tablet TAKE 1 TABLET(20 MG) BY MOUTH DAILY  . [DISCONTINUED] metoprolol tartrate (LOPRESSOR) 50 MG tablet Take 1 tablet (50 mg total) by mouth 2 (two) times daily.     Allergies:   Patient has no known allergies.   Social  History   Socioeconomic History  . Marital status: Married    Spouse name: Not on file  . Number of children: Not on file  . Years of education: Not on file  . Highest education level: Not on file  Occupational History  . Not on file  Tobacco Use  . Smoking status: Former Smoker    Packs/day: 0.20    Years: 15.00    Pack years: 3.00    Types: Cigarettes  . Smokeless tobacco: Never Used  Substance and Sexual Activity  . Alcohol use: Yes    Comment: 40oz beer  . Drug use: No  . Sexual activity: Yes    Birth control/protection: None  Other Topics Concern  . Not on file  Social History Narrative  . Not on file   Social Determinants of Health   Financial Resource Strain:   . Difficulty of Paying Living Expenses:   Food Insecurity:   . Worried About Charity fundraiser in the Last Year:   . Arboriculturist in the Last Year:   Transportation Needs:   . Film/video editor (Medical):   Marland Kitchen Lack of Transportation (Non-Medical):   Physical Activity:   . Days of Exercise per Week:   . Minutes of Exercise per Session:   Stress:   . Feeling of Stress :   Social Connections:   . Frequency of Communication with Friends and Family:   . Frequency of Social Gatherings with Friends and Family:   . Attends Religious Services:   . Active Member of Clubs or Organizations:   . Attends Archivist Meetings:   Marland Kitchen Marital Status:      Family History:  The patient's   family history includes Breast cancer in her maternal grandmother; Heart disease in her mother; Hyperlipidemia in her mother and sister; Hypertension in her mother and sister.   ROS:   Please see the history of present illness.    ROS All other systems reviewed and are negative.   PHYSICAL EXAM:   VS:  BP (!) 144/82   Pulse 85   Ht '5\' 3"'$  (1.6 m)   Wt 191 lb 1.9 oz (86.7 kg)   SpO2 93%   BMI 33.86 kg/m   Physical Exam  GEN: Overweight, in no acute distress  Neck: no JVD, carotid bruits, or  masses Cardiac:RRR; no murmurs, rubs, or gallops  Respiratory:  clear to auscultation bilaterally, normal work of breathing GI: soft, nontender, nondistended, + BS Ext: without cyanosis, clubbing, or edema, Good distal pulses bilaterally Neuro:  Alert and Oriented x 3 Psych: euthymic mood, full affect  Wt Readings from Last 3 Encounters:  04/19/19 191 lb 1.9 oz (86.7 kg)  02/01/19 189 lb 6.4 oz (85.9 kg)  10/05/18 185 lb (83.9 kg)  Studies/Labs Reviewed:   EKG:  EKG is  ordered today.  The ekg ordered today demonstrates NSR normal EKG  Recent Labs: 08/17/2018: BUN 14; Creatinine, Ser 0.75; Hemoglobin 12.0; Platelets 445; Potassium 4.2; Sodium 138 11/30/2018: ALT 21   Lipid Panel    Component Value Date/Time   CHOL 161 11/30/2018 0923   TRIG 109 11/30/2018 0923   HDL 72 11/30/2018 0923   CHOLHDL 2.2 11/30/2018 0923   CHOLHDL 3 08/09/2017 1357   VLDL 22.4 08/09/2017 1357   LDLCALC 70 11/30/2018 0923    Additional studies/ records that were reviewed today include:   Echo 08/17/18 IMPRESSIONS     1. The left ventricle has normal systolic function, with an ejection  fraction of 55-60%. The cavity size was normal. Left ventricular diastolic  Doppler parameters are consistent with pseudonormalization. Elevated mean  left atrial pressure No evidence of   left ventricular regional wall motion abnormalities.   2. The right ventricle has normal systolic function. The cavity was  normal. There is no increase in right ventricular wall thickness. Right  ventricular systolic pressure could not be assessed.   3. Left atrial size was mildly dilated.   4. The MR jet is posteriorly-directed.   5. The aorta is normal in size and structure.   FINDINGS   Left Ventricle: The left ventricle has normal systolic function, with an  ejection fraction of 55-60%. The cavity size was normal. There is no  increase in left ventricular wall thickness. Left ventricular diastolic  Doppler  parameters are consistent with  pseudonormalization. Elevated mean left atrial pressure No evidence of  left ventricular regional wall motion abnormalities..   Right Ventricle: The right ventricle has normal systolic function. The  cavity was normal. There is no increase in right ventricular wall  thickness. Right ventricular systolic pressure could not be assessed.   Left Atrium: Left atrial size was mildly dilated.   Right Atrium: Right atrial size was normal in size. Right atrial pressure  is estimated at 10 mmHg.   Interatrial Septum: No atrial level shunt detected by color flow Doppler.   Pericardium: There is no evidence of pericardial effusion.   Mitral Valve: The mitral valve is normal in structure. Mitral valve  regurgitation is mild by color flow Doppler. The MR jet is  posteriorly-directed.   Tricuspid Valve: The tricuspid valve is normal in structure. Tricuspid  valve regurgitation was not visualized by color flow Doppler.   Aortic Valve: The aortic valve is normal in structure. Aortic valve  regurgitation was not visualized by color flow Doppler.   Pulmonic Valve: The pulmonic valve was normal in structure. Pulmonic valve  regurgitation is not visualized by color flow Doppler.   Aorta: The aorta is normal in size and structure.   Venous: The inferior vena cava is normal in size with greater than 50%  respiratory variability.   Echocardiogram: 02/10/16 Study Conclusions   - Left ventricle: The cavity size was normal. Wall thickness was   normal. Systolic function was mildly reduced. The estimated   ejection fraction was in the range of 45% to 50%. Basal to mid   inferior/posterior wall hypokinesis. Doppler parameters are   consistent with abnormal left ventricular relaxation (grade 1   diastolic dysfunction). The E/e&' ratio is between 8-15,   suggesting indeterminate LV filling pressure. - Mitral valve: Mildly thickened leaflets . There was trivial    regurgitation. - Left atrium: The atrium was normal in size. - Right ventricle: The cavity size  was normal. Systolic function is   reduced. - Right atrium: The atrium was normal in size. - Inferior vena cava: The vessel was normal in size. The   respirophasic diameter changes were in the normal range (= 50%),   consistent with normal central venous pressure.   Impressions:   - LVEF 45-50%, normal wall thickness, basal to mid   inferior/posterior hypokinesis, grade 1 DD, indeterminate LV   filling pressure, normal LA size, normal IVC.   Coronary Stent Intervention   02/10/16  Left Heart Cath and Coronary Angiography  Conclusion     Non-ST elevation myocardial infarction with ongoing chest discomfort upon being placed on the cath table.  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 Severe diffuse in-stent restenosis involving the right coronary throughout the entire stented mid segment. There is also high-grade obstruction greater than 95% distal to the stent. The proximal RCA contains diffuse 50-70% stenosis with a large saccular aneurysm.  99% mid circumflex with TIMI grade 1 flow.  Widely patent LAD and large diagonal  Normal left main.  Normal LV size, with severe inferior wall hypokinesis. EF 45-50%.  Successful angioplasty and stenting of the mid circumflex from 99% with TIMI grade one flow to 0% with TIMI grade 3 flow. A Resolute Onyx 18 x 3.5 mm DES was placed and dilated to 15 atm using the stent system.  Failed angioplasty of the right coronary in-stent restenosis and distal de novo disease due to inability to cross with the balloon.   RECOMMENDATIONS:  The acute lesion is a circumflex which was treated successfully with DES as noted above.  Aspirin and Plavix for least 12 months or longer.  Whether or not intervention should be attempted on the right coronary will depend upon clinical course with the circumflex coronary artery patent. If no  angina, medical therapy would be most reasonable. If continued angina, in several weeks or earlier femoral catheterization with attempted rotational atherectomy/CSI and re-stenting.  Potentially able to be discharged in a.m. if able ambulate without chest discomfort. If chest discomfort continues to be an issue, right coronary intervention will need to be dealt with sooner rather than later.          ASSESSMENT:    1. Coronary artery disease involving native coronary artery of native heart without angina pectoris   2. Ischemic cardiomyopathy   3. Essential hypertension   4. Hyperlipidemia, unspecified hyperlipidemia type   5. Morbid obesity ([place])      PLAN:  In order of problems listed above:  CAD status post MI treated with stent to the RCA in 2000 in [place].  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  She had DES to the circumflex and failed angioplasty to the RCA in-stent restenosis and distal de novo disease due to inability to cross the balloon.  Medical therapy recommended for the RCA lesion.No angina.  Continue Plavix & ASA, & lipitor.150 min exercise weekly.   ICM LVEF 45-50% on echo 02/2016-normalized on echo 08/2018 EF 55-60%..   2 gm sodium diet discussed.   Essential HTN stable on lisinopril and metoprolol   HLD-LDL 70 11/30/18 on lipitor 80 mg daily   Obesity-exercise and weight loss program discussed. Offered referral to weight loss center but she wants to try weight watchers first.  Medication Adjustments/Labs and Tests Ordered: Current medicines are reviewed at length with the patient today.  Concerns regarding medicines are outlined above.  Medication changes, Labs and Tests ordered today are listed in the Patient Instructions below. Patient Instructions   Medication Instructions:  Your  physician recommends that you continue on your current medications as directed. Please refer to the Current Medication list given to you today.  *If you need a refill on your cardiac medications before your next appointment, please call your pharmacy*   Lab Work: None ordered  If you have labs (blood work) drawn today and your tests are completely normal, you will receive your results only by: Marland Kitchen MyChart Message (if you have MyChart) OR . A paper copy in the mail If you have any lab test that is abnormal or we need to change your treatment, we will call you to review the results.   Testing/Procedures: None ordered   Follow-Up: At [facility], you and your health needs are our priority.  As part of our continuing mission to provide you with exceptional heart care, we have created designated Provider Care Teams.  These Care Teams include your primary Cardiologist (physician) and Advanced Practice Providers (APPs -  Physician Assistants and Nurse Practitioners) who all work together to provide you with the care you need, when you need it.  We recommend signing up for the patient portal called "MyChart".  Sign up information is provided on this After Visit Summary.  MyChart is used to connect with patients for Virtual Visits (Telemedicine).  Patients are able to view lab/test results, encounter notes, upcoming appointments, etc.  Non-urgent messages can be sent to your provider as well.   To learn more about what you can do with MyChart, go to [email or web address].    Your next appointment:   12 month(s)  The format for your next appointment:   In Person  Provider:   You may see Lauree Chandler, MD or one of the following Advanced Practice Providers on your designated Care Team:    Melina Copa, PA-C  Ermalinda Barrios, PA-C    Other Instructions Your provider recommends that you maintain 150 minutes per week of moderate aerobic activity.  Your physician encouraged you to  lose weight for better health. Let us know if you would like a referral to the Weight Loss Clinic.  Two Gram Sodium Diet 2000 mg  What is Sodium? Sodium is a mineral found naturally in many foods. The most significant source of sodium in the diet is table salt, which is about 40% sodium.  Processed, convenience, and preserved foods also contain a large amount of sodium.  The body needs only 500 mg of sodium daily to function,  A normal diet provides more than enough sodium even if you do not use salt.  Why Limit Sodium? A build up of sodium in the body can cause thirst, increased blood pressure, shortness of breath, and water retention.  Decreasing sodium in the diet can reduce edema and risk of heart attack or stroke associated with high blood pressure.  Keep in mind that there are many other factors involved in these health problems.  Heredity, obesity, lack of exercise, cigarette smoking, stress and what you eat all play a role.  General Guidelines:  Do not add salt at the table or in cooking.  One teaspoon of salt contains over 2 grams of sodium.  Read food labels  Avoid processed and convenience foods  Ask your dietitian before eating any foods  not dicussed in the menu planning guidelines  Consult your physician if you wish to use a salt substitute or a sodium containing medication such as antacids.  Limit milk and milk products to 16 oz (2 cups) per day.  Shopping Hints:  READ LABELS!! "Dietetic" does not necessarily mean low sodium.  Salt and other sodium ingredients are often added to foods during processing.   Menu Planning Guidelines Food Group Choose More Often Avoid  Beverages (see also the milk group All fruit juices, low-sodium, salt-free vegetables juices, low-sodium carbonated beverages Regular vegetable or tomato juices, commercially softened water used for drinking or cooking  Breads and Cereals Enriched white, wheat, rye and pumpernickel bread, hard rolls and dinner  rolls; muffins, cornbread and waffles; most dry cereals, cooked cereal without added salt; unsalted crackers and breadsticks; low sodium or homemade bread crumbs Bread, rolls and crackers with salted tops; quick breads; instant hot cereals; pancakes; commercial bread stuffing; self-rising flower and biscuit mixes; regular bread crumbs or cracker crumbs  Desserts and Sweets Desserts and sweets mad with mild should be within allowance Instant pudding mixes and cake mixes  Fats Butter or margarine; vegetable oils; unsalted salad dressings, regular salad dressings limited to 1 Tbs; light, sour and heavy cream Regular salad dressings containing bacon fat, bacon bits, and salt pork; snack dips made with instant soup mixes or processed cheese; salted nuts  Fruits Most fresh, frozen and canned fruits Fruits processed with salt or sodium-containing ingredient (some dried fruits are processed with sodium sulfites        Vegetables Fresh, frozen vegetables and low- sodium canned vegetables Regular canned vegetables, sauerkraut, pickled vegetables, and others prepared in brine; frozen vegetables in sauces; vegetables seasoned with ham, bacon or salt pork  Condiments, Sauces, Miscellaneous  Salt substitute with physician's approval; pepper, herbs, spices; vinegar, lemon or lime juice; hot pepper sauce; garlic powder, onion powder, low sodium soy sauce (1 Tbs.); low sodium condiments (ketchup, chili sauce, mustard) in limited amounts (1 tsp.) fresh ground horseradish; unsalted tortilla chips, pretzels, potato chips, popcorn, salsa (1/4 cup) Any seasoning made with salt including garlic salt, celery salt, onion salt, and seasoned salt; sea salt, rock salt, kosher salt; meat tenderizers; monosodium glutamate; mustard, regular soy sauce, barbecue, sauce, chili sauce, teriyaki sauce, steak sauce, Worcestershire sauce, and most flavored vinegars; canned gravy and mixes; regular condiments; salted snack foods, olives,  picles, relish, horseradish sauce, catsup   Food preparation: Try these seasonings Meats:    Pork Sage, onion Serve with applesauce  Chicken Poultry seasoning, thyme, parsley Serve with cranberry sauce  Lamb Curry powder, rosemary, garlic, thyme Serve with mint sauce or jelly  Veal Marjoram, basil Serve with current jelly, cranberry sauce  Beef Pepper, bay leaf Serve with dry mustard, unsalted chive butter  Fish Bay leaf, dill Serve with unsalted lemon butter, unsalted parsley butter  Vegetables:    Asparagus Lemon juice   Broccoli Lemon juice   Carrots Mustard dressing parsley, mint, nutmeg, glazed with unsalted butter and sugar   Green beans Marjoram, lemon juice, nutmeg,dill seed   Tomatoes Basil, marjoram, onion   Spice /blend for [employer]" 4 tsp ground thyme 1 tsp ground sage 3 tsp ground rosemary 4 tsp ground marjoram   Test your knowledge 1. A product that says "Salt Free" may still contain sodium. True or False 2. Garlic Powder and Hot Pepper Sauce an be used as alternative seasonings.True or False 3. Processed foods have more sodium than fresh foods.  True  or False 4. Canned Vegetables have less sodium than froze True or False  WAYS TO DECREASE YOUR SODIUM INTAKE 1. Avoid the use of added salt in cooking and at the table.  Table salt (and other prepared seasonings which contain salt) is probably one of the greatest sources of sodium in the diet.  Unsalted foods can gain flavor from the sweet, sour, and butter taste sensations of herbs and spices.  Instead of using salt for seasoning, try the following seasonings with the foods listed.  Remember: how you use them to enhance natural food flavors is limited only by your creativity... Allspice-Meat, fish, eggs, fruit, peas, red and yellow vegetables Almond Extract-Fruit baked goods Anise Seed-Sweet breads, fruit, carrots, beets, cottage cheese, cookies (tastes like licorice) Basil-Meat, fish, eggs, vegetables, rice, vegetables  salads, soups, sauces Bay Leaf-Meat, fish, stews, poultry Burnet-Salad, vegetables (cucumber-like flavor) Caraway Seed-Bread, cookies, cottage cheese, meat, vegetables, cheese, rice Cardamon-Baked goods, fruit, soups Celery Powder or seed-Salads, salad dressings, sauces, meatloaf, soup, bread.Do not use  celery salt Chervil-Meats, salads, fish, eggs, vegetables, cottage cheese (parsley-like flavor) Chili Power-Meatloaf, chicken cheese, corn, eggplant, egg dishes Chives-Salads cottage cheese, egg dishes, soups, vegetables, sauces Cilantro-Salsa, casseroles Cinnamon-Baked goods, fruit, pork, lamb, chicken, carrots Cloves-Fruit, baked goods, fish, pot roast, green beans, beets, carrots Coriander-Pastry, cookies, meat, salads, cheese (lemon-orange flavor) Cumin-Meatloaf, fish,cheese, eggs, cabbage,fruit pie (caraway flavor) [employer], fruit, eggs, fish, poultry, cottage cheese, vegetables Dill Seed-Meat, cottage cheese, poultry, vegetables, fish, salads, bread Fennel Seed-Bread, cookies, apples, pork, eggs, fish, beets, cabbage, cheese, Licorice-like flavor Garlic-(buds or powder) Salads, meat, poultry, fish, bread, butter, vegetables, potatoes.Do not  use garlic salt Ginger-Fruit, vegetables, baked goods, meat, fish, poultry Horseradish Root-Meet, vegetables, butter Lemon Juice or Extract-Vegetables, fruit, tea, baked goods, fish salads Mace-Baked goods fruit, vegetables, fish, poultry (taste like nutmeg) Maple Extract-Syrups Marjoram-Meat, chicken, fish, vegetables, breads, green salads (taste like Sage) Mint-Tea, lamb, sherbet, vegetables, desserts, carrots, cabbage Mustard, Dry or Seed-Cheese, eggs, meats, vegetables, poultry Nutmeg-Baked goods, fruit, chicken, eggs, vegetables, desserts Onion Powder-Meat, fish, poultry, vegetables, cheese, eggs, bread, rice salads (Do not use   Onion salt) Orange Extract-Desserts, baked goods Oregano-Pasta, eggs, cheese, onions, pork, lamb,  fish, chicken, vegetables, green salads Paprika-Meat, fish, poultry, eggs, cheese, vegetables Parsley Flakes-Butter, vegetables, meat fish, poultry, eggs, bread, salads (certain forms may   Contain sodium Pepper-Meat fish, poultry, vegetables, eggs Peppermint Extract-Desserts, baked goods Poppy Seed-Eggs, bread, cheese, fruit dressings, baked goods, noodles, vegetables, cottage  [employer], poultry, meat, fish, cauliflower, turnips,eggs bread Saffron-Rice, bread, veal, chicken, fish, eggs Sage-Meat, fish, poultry, onions, eggplant, tomateos, pork, stews Savory-Eggs, salads, poultry, meat, rice, vegetables, soups, pork Tarragon-Meat, poultry, fish, eggs, butter, vegetables (licorice-like flavor)  Thyme-Meat, poultry, fish, eggs, vegetables, (clover-like flavor), sauces, soups Tumeric-Salads, butter, eggs, fish, rice, vegetables (saffron-like flavor) Vanilla Extract-Baked goods, candy Vinegar-Salads, vegetables, meat marinades Walnut Extract-baked goods, candy  2. Choose your Foods Wisely   The following is a list of foods to avoid which are high in sodium:  Meats-Avoid all smoked, canned, salt cured, dried and kosher meat and fish as well as Anchovies   [employer] meats:Bologna, Liverwurst, Pastrami Canned meat or fish  Marinated herring Caviar    Pepperoni Corned Beef   Pizza Dried chipped beef  Salami Frozen breaded fish or meat Salt pork Frankfurters or hot dogs  Sardines Gefilte fish   Sausage Ham (boiled ham, Proscuitto Smoked butt    spiced ham)   Spam  TV Dinners Vegetables Canned vegetables (Regular) Relish Canned mushrooms  Sauerkraut Olives    Tomato juice Pickles  Bakery and Dessert Products Canned puddings  Cream pies Cheesecake   Decorated cakes Cookies  Beverages/Juices Tomato juice, regular  Gatorade   V-8 vegetable juice, regular  Breads and Cereals Biscuit mixes   Salted potato chips, corn  chips, pretzels Bread stuffing mixes  Salted crackers and rolls Pancake and waffle mixes Self-rising flour  Seasonings Accent    Meat sauces Barbecue sauce  Meat tenderizer Catsup    Monosodium glutamate (MSG) Celery salt   Onion salt Chili sauce   Prepared mustard Garlic salt   Salt, seasoned salt, sea salt Gravy mixes   Soy sauce Horseradish   Steak sauce Ketchup   Tartar sauce Lite salt    Teriyaki sauce Marinade mixes   Worcestershire sauce  Others Baking powder   Cocoa and cocoa mixes Baking soda   Commercial casserole mixes Candy-caramels, chocolate  Dehydrated soups    Bars, fudge,nougats  Instant rice and pasta mixes Canned broth or soup  Maraschino cherries Cheese, aged and processed cheese and cheese spreads  Learning Assessment Quiz  Indicated T (for True) or F (for False) for each of the following statements:  1. _____ Fresh fruits and vegetables and unprocessed grains are generally low in sodium 2. _____ Water may contain a considerable amount of sodium, depending on the source 3. _____ You can always tell if a food is high in sodium by tasting it 4. _____ Certain laxatives my be high in sodium and should be avoided unless prescribed   by a physician or pharmacist 5. _____ Salt substitutes may be used freely by anyone on a sodium restricted diet 6. _____ Sodium is present in table salt, food additives and as a natural component of   most foods 7. _____ Table salt is approximately 90% sodium 8. _____ Limiting sodium intake may help prevent excess fluid accumulation in the body 9. _____ On a sodium-restricted diet, seasonings such as bouillon soy sauce, and    cooking wine should be used in place of table salt 10. _____ On an ingredient list, a product which lists monosodium glutamate as the first   ingredient is an appropriate food to include on a low sodium diet  Circle the best answer(s) to the following statements (Hint: there may be more than one correct  answer)  11. On a low-sodium diet, some acceptable snack items are:    A. Olives  F. Bean dip   K. Grapefruit juice    B. Salted Pretzels G. Commercial Popcorn   L. Canned peaches    C. Carrot Sticks  H. Bouillon   M. Unsalted nuts   D. [place] fries  I. Peanut butter crackers N. Salami   E. Sweet pickles J. Tomato Juice   O. Pizza  12.  Seasonings that may be used freely on a reduced - sodium diet include   A. Lemon wedges F.Monosodium glutamate K. Celery seed    B.Soysauce   G. Pepper   L. Mustard powder   C. Sea salt  H. Cooking wine  M. Onion flakes   D. Vinegar  E. Prepared horseradish N. Salsa   E. Sage   J. Worcestershire sauce  O. [address]          Sumner Boast, PA-C  04/19/2019 8:59 AM    [facility] [address], [place], [place]  [address] Phone: [phone]; Fax: [phone]    

## 2019-04-19 ENCOUNTER — Encounter: Payer: Self-pay | Admitting: Physician Assistant

## 2019-04-19 ENCOUNTER — Other Ambulatory Visit: Payer: Self-pay

## 2019-04-19 ENCOUNTER — Ambulatory Visit: Payer: Medicare Other | Admitting: Physician Assistant

## 2019-04-19 VITALS — BP 144/82 | HR 85 | Ht 63.0 in | Wt 191.1 lb

## 2019-04-19 DIAGNOSIS — I255 Ischemic cardiomyopathy: Secondary | ICD-10-CM

## 2019-04-19 DIAGNOSIS — E785 Hyperlipidemia, unspecified: Secondary | ICD-10-CM

## 2019-04-19 DIAGNOSIS — I2510 Atherosclerotic heart disease of native coronary artery without angina pectoris: Secondary | ICD-10-CM

## 2019-04-19 DIAGNOSIS — I10 Essential (primary) hypertension: Secondary | ICD-10-CM | POA: Diagnosis not present

## 2019-04-19 MED ORDER — EZETIMIBE 10 MG PO TABS: 10.0000 mg | ORAL_TABLET | Freq: Every day | ORAL | 3 refills | Status: DC

## 2019-04-19 MED ORDER — LISINOPRIL 20 MG PO TABS: ORAL_TABLET | ORAL | 3 refills | Status: DC

## 2019-04-19 MED ORDER — AMLODIPINE BESYLATE 5 MG PO TABS: ORAL_TABLET | ORAL | 3 refills | Status: DC

## 2019-04-19 MED ORDER — CLOPIDOGREL BISULFATE 75 MG PO TABS: ORAL_TABLET | ORAL | 3 refills | Status: DC

## 2019-04-19 MED ORDER — METOPROLOL TARTRATE 50 MG PO TABS: 50.0000 mg | ORAL_TABLET | Freq: Two times a day (BID) | ORAL | 3 refills | Status: DC

## 2019-04-19 MED ORDER — ATORVASTATIN CALCIUM 80 MG PO TABS: 80.0000 mg | ORAL_TABLET | Freq: Every day | ORAL | 3 refills | Status: DC

## 2019-04-19 NOTE — Patient Instructions (Signed)
Medication Instructions:  Your physician recommends that you continue on your current medications as directed. Please refer to the Current Medication list given to you today.  *If you need a refill on your cardiac medications before your next appointment, please call your pharmacy*   Lab Work: None ordered  If you have labs (blood work) drawn today and your tests are completely normal, you will receive your results only by: Marland Kitchen MyChart Message (if you have MyChart) OR . A paper copy in the mail If you have any lab test that is abnormal or we need to change your treatment, we will call you to review the results.   Testing/Procedures: None ordered   Follow-Up: At [facility], you and your health needs are our priority.  As part of our continuing mission to provide you with exceptional heart care, we have created designated Provider Care Teams.  These Care Teams include your primary Cardiologist (physician) and Advanced Practice Providers (APPs -  Physician Assistants and Nurse Practitioners) who all work together to provide you with the care you need, when you need it.  We recommend signing up for the patient portal called "MyChart".  Sign up information is provided on this After Visit Summary.  MyChart is used to connect with patients for Virtual Visits (Telemedicine).  Patients are able to view lab/test results, encounter notes, upcoming appointments, etc.  Non-urgent messages can be sent to your provider as well.   To learn more about what you can do with MyChart, go to [email or web address].    Your next appointment:   12 month(s)  The format for your next appointment:   In Person  Provider:   You may see Lauree Chandler, MD or one of the following Advanced Practice Providers on your designated Care Team:    Melina Copa, PA-C  Ermalinda Barrios, PA-C    Other Instructions Your provider recommends that you maintain 150 minutes per week of moderate aerobic  activity.  Your physician encouraged you to lose weight for better health. Let us know if you would like a referral to the Weight Loss Clinic.  Two Gram Sodium Diet 2000 mg  What is Sodium? Sodium is a mineral found naturally in many foods. The most significant source of sodium in the diet is table salt, which is about 40% sodium.  Processed, convenience, and preserved foods also contain a large amount of sodium.  The body needs only 500 mg of sodium daily to function,  A normal diet provides more than enough sodium even if you do not use salt.  Why Limit Sodium? A build up of sodium in the body can cause thirst, increased blood pressure, shortness of breath, and water retention.  Decreasing sodium in the diet can reduce edema and risk of heart attack or stroke associated with high blood pressure.  Keep in mind that there are many other factors involved in these health problems.  Heredity, obesity, lack of exercise, cigarette smoking, stress and what you eat all play a role.  General Guidelines:  Do not add salt at the table or in cooking.  One teaspoon of salt contains over 2 grams of sodium.  Read food labels  Avoid processed and convenience foods  Ask your dietitian before eating any foods not dicussed in the menu planning guidelines  Consult your physician if you wish to use a salt substitute or a sodium containing medication such as antacids.  Limit milk and milk products to 16 oz (2 cups) per day.  Shopping  Hints:  READ LABELS!! "Dietetic" does not necessarily mean low sodium.  Salt and other sodium ingredients are often added to foods during processing.   Menu Planning Guidelines Food Group Choose More Often Avoid  Beverages (see also the milk group All fruit juices, low-sodium, salt-free vegetables juices, low-sodium carbonated beverages Regular vegetable or tomato juices, commercially softened water used for drinking or cooking  Breads and Cereals Enriched white, wheat, rye  and pumpernickel bread, hard rolls and dinner rolls; muffins, cornbread and waffles; most dry cereals, cooked cereal without added salt; unsalted crackers and breadsticks; low sodium or homemade bread crumbs Bread, rolls and crackers with salted tops; quick breads; instant hot cereals; pancakes; commercial bread stuffing; self-rising flower and biscuit mixes; regular bread crumbs or cracker crumbs  Desserts and Sweets Desserts and sweets mad with mild should be within allowance Instant pudding mixes and cake mixes  Fats Butter or margarine; vegetable oils; unsalted salad dressings, regular salad dressings limited to 1 Tbs; light, sour and heavy cream Regular salad dressings containing bacon fat, bacon bits, and salt pork; snack dips made with instant soup mixes or processed cheese; salted nuts  Fruits Most fresh, frozen and canned fruits Fruits processed with salt or sodium-containing ingredient (some dried fruits are processed with sodium sulfites        Vegetables Fresh, frozen vegetables and low- sodium canned vegetables Regular canned vegetables, sauerkraut, pickled vegetables, and others prepared in brine; frozen vegetables in sauces; vegetables seasoned with ham, bacon or salt pork  Condiments, Sauces, Miscellaneous  Salt substitute with physician's approval; pepper, herbs, spices; vinegar, lemon or lime juice; hot pepper sauce; garlic powder, onion powder, low sodium soy sauce (1 Tbs.); low sodium condiments (ketchup, chili sauce, mustard) in limited amounts (1 tsp.) fresh ground horseradish; unsalted tortilla chips, pretzels, potato chips, popcorn, salsa (1/4 cup) Any seasoning made with salt including garlic salt, celery salt, onion salt, and seasoned salt; sea salt, rock salt, kosher salt; meat tenderizers; monosodium glutamate; mustard, regular soy sauce, barbecue, sauce, chili sauce, teriyaki sauce, steak sauce, Worcestershire sauce, and most flavored vinegars; canned gravy and mixes; regular  condiments; salted snack foods, olives, picles, relish, horseradish sauce, catsup   Food preparation: Try these seasonings Meats:    Pork Sage, onion Serve with applesauce  Chicken Poultry seasoning, thyme, parsley Serve with cranberry sauce  Lamb Curry powder, rosemary, garlic, thyme Serve with mint sauce or jelly  Veal Marjoram, basil Serve with current jelly, cranberry sauce  Beef Pepper, bay leaf Serve with dry mustard, unsalted chive butter  Fish Bay leaf, dill Serve with unsalted lemon butter, unsalted parsley butter  Vegetables:    Asparagus Lemon juice   Broccoli Lemon juice   Carrots Mustard dressing parsley, mint, nutmeg, glazed with unsalted butter and sugar   Green beans Marjoram, lemon juice, nutmeg,dill seed   Tomatoes Basil, marjoram, onion   Spice /blend for [employer]" 4 tsp ground thyme 1 tsp ground sage 3 tsp ground rosemary 4 tsp ground marjoram   Test your knowledge 1. A product that says "Salt Free" may still contain sodium. True or False 2. Garlic Powder and Hot Pepper Sauce an be used as alternative seasonings.True or False 3. Processed foods have more sodium than fresh foods.  True or False 4. Canned Vegetables have less sodium than froze True or False  WAYS TO DECREASE YOUR SODIUM INTAKE 1. Avoid the use of added salt in cooking and at the table.  Table salt (and other prepared seasonings which contain  salt) is probably one of the greatest sources of sodium in the diet.  Unsalted foods can gain flavor from the sweet, sour, and butter taste sensations of herbs and spices.  Instead of using salt for seasoning, try the following seasonings with the foods listed.  Remember: how you use them to enhance natural food flavors is limited only by your creativity... Allspice-Meat, fish, eggs, fruit, peas, red and yellow vegetables Almond Extract-Fruit baked goods Anise Seed-Sweet breads, fruit, carrots, beets, cottage cheese, cookies (tastes like licorice) Basil-Meat,  fish, eggs, vegetables, rice, vegetables salads, soups, sauces Bay Leaf-Meat, fish, stews, poultry Burnet-Salad, vegetables (cucumber-like flavor) Caraway Seed-Bread, cookies, cottage cheese, meat, vegetables, cheese, rice Cardamon-Baked goods, fruit, soups Celery Powder or seed-Salads, salad dressings, sauces, meatloaf, soup, bread.Do not use  celery salt Chervil-Meats, salads, fish, eggs, vegetables, cottage cheese (parsley-like flavor) Chili Power-Meatloaf, chicken cheese, corn, eggplant, egg dishes Chives-Salads cottage cheese, egg dishes, soups, vegetables, sauces Cilantro-Salsa, casseroles Cinnamon-Baked goods, fruit, pork, lamb, chicken, carrots Cloves-Fruit, baked goods, fish, pot roast, green beans, beets, carrots Coriander-Pastry, cookies, meat, salads, cheese (lemon-orange flavor) Cumin-Meatloaf, fish,cheese, eggs, cabbage,fruit pie (caraway flavor) [employer], fruit, eggs, fish, poultry, cottage cheese, vegetables Dill Seed-Meat, cottage cheese, poultry, vegetables, fish, salads, bread Fennel Seed-Bread, cookies, apples, pork, eggs, fish, beets, cabbage, cheese, Licorice-like flavor Garlic-(buds or powder) Salads, meat, poultry, fish, bread, butter, vegetables, potatoes.Do not  use garlic salt Ginger-Fruit, vegetables, baked goods, meat, fish, poultry Horseradish Root-Meet, vegetables, butter Lemon Juice or Extract-Vegetables, fruit, tea, baked goods, fish salads Mace-Baked goods fruit, vegetables, fish, poultry (taste like nutmeg) Maple Extract-Syrups Marjoram-Meat, chicken, fish, vegetables, breads, green salads (taste like Sage) Mint-Tea, lamb, sherbet, vegetables, desserts, carrots, cabbage Mustard, Dry or Seed-Cheese, eggs, meats, vegetables, poultry Nutmeg-Baked goods, fruit, chicken, eggs, vegetables, desserts Onion Powder-Meat, fish, poultry, vegetables, cheese, eggs, bread, rice salads (Do not use   Onion salt) Orange Extract-Desserts, baked  goods Oregano-Pasta, eggs, cheese, onions, pork, lamb, fish, chicken, vegetables, green salads Paprika-Meat, fish, poultry, eggs, cheese, vegetables Parsley Flakes-Butter, vegetables, meat fish, poultry, eggs, bread, salads (certain forms may   Contain sodium Pepper-Meat fish, poultry, vegetables, eggs Peppermint Extract-Desserts, baked goods Poppy Seed-Eggs, bread, cheese, fruit dressings, baked goods, noodles, vegetables, cottage  [employer], poultry, meat, fish, cauliflower, turnips,eggs bread Saffron-Rice, bread, veal, chicken, fish, eggs Sage-Meat, fish, poultry, onions, eggplant, tomateos, pork, stews Savory-Eggs, salads, poultry, meat, rice, vegetables, soups, pork Tarragon-Meat, poultry, fish, eggs, butter, vegetables (licorice-like flavor)  Thyme-Meat, poultry, fish, eggs, vegetables, (clover-like flavor), sauces, soups Tumeric-Salads, butter, eggs, fish, rice, vegetables (saffron-like flavor) Vanilla Extract-Baked goods, candy Vinegar-Salads, vegetables, meat marinades Walnut Extract-baked goods, candy  2. Choose your Foods Wisely   The following is a list of foods to avoid which are high in sodium:  Meats-Avoid all smoked, canned, salt cured, dried and kosher meat and fish as well as Anchovies   [employer] Rx meats:Bologna, Liverwurst, Pastrami Canned meat or fish  Marinated herring Caviar    Pepperoni Corned Beef   Pizza Dried chipped beef  Salami Frozen breaded fish or meat Salt pork Frankfurters or hot dogs  Sardines Gefilte fish   Sausage Ham (boiled ham, Proscuitto Smoked butt    spiced ham)   Spam      TV Dinners Vegetables Canned vegetables (Regular) Relish Canned mushrooms  Sauerkraut Olives    Tomato juice Pickles  Bakery and Dessert Products Canned puddings  Cream pies Cheesecake   Decorated cakes Cookies  Beverages/Juices Tomato juice, regular  Gatorade  V-8 vegetable juice, regular  Breads and  Cereals Biscuit mixes   Salted potato chips, corn chips, pretzels Bread stuffing mixes  Salted crackers and rolls Pancake and waffle mixes Self-rising flour  Seasonings Accent    Meat sauces Barbecue sauce  Meat tenderizer Catsup    Monosodium glutamate (MSG) Celery salt   Onion salt Chili sauce   Prepared mustard Garlic salt   Salt, seasoned salt, sea salt Gravy mixes   Soy sauce Horseradish   Steak sauce Ketchup   Tartar sauce Lite salt    Teriyaki sauce Marinade mixes   Worcestershire sauce  Others Baking powder   Cocoa and cocoa mixes Baking soda   Commercial casserole mixes Candy-caramels, chocolate  Dehydrated soups    Bars, fudge,nougats  Instant rice and pasta mixes Canned broth or soup  Maraschino cherries Cheese, aged and processed cheese and cheese spreads  Learning Assessment Quiz  Indicated T (for True) or F (for False) for each of the following statements:  1. _____ Fresh fruits and vegetables and unprocessed grains are generally low in sodium 2. _____ Water may contain a considerable amount of sodium, depending on the source 3. _____ You can always tell if a food is high in sodium by tasting it 4. _____ Certain laxatives my be high in sodium and should be avoided unless prescribed   by a physician or pharmacist 5. _____ Salt substitutes may be used freely by anyone on a sodium restricted diet 6. _____ Sodium is present in table salt, food additives and as a natural component of   most foods 7. _____ Table salt is approximately 90% sodium 8. _____ Limiting sodium intake may help prevent excess fluid accumulation in the body 9. _____ On a sodium-restricted diet, seasonings such as bouillon soy sauce, and    cooking wine should be used in place of table salt 10. _____ On an ingredient list, a product which lists monosodium glutamate as the first   ingredient is an appropriate food to include on a low sodium diet  Circle the best answer(s) to the following  statements (Hint: there may be more than one correct answer)  11. On a low-sodium diet, some acceptable snack items are:    A. Olives  F. Bean dip   K. Grapefruit juice    B. Salted Pretzels G. Commercial Popcorn   L. Canned peaches    C. Carrot Sticks  H. Bouillon   M. Unsalted nuts   D. [place] fries  I. Peanut butter crackers N. Salami   E. Sweet pickles J. Tomato Juice   O. Pizza  12.  Seasonings that may be used freely on a reduced - sodium diet include   A. Lemon wedges F.Monosodium glutamate K. Celery seed    B.Soysauce   G. Pepper   L. Mustard powder   C. Sea salt  H. Cooking wine  M. Onion flakes   D. Vinegar  E. Prepared horseradish N. Salsa   E. Sage   J. Worcestershire sauce  O. [place]

## 2019-05-15 ENCOUNTER — Telehealth: Payer: Self-pay | Admitting: Family Medicine

## 2019-05-15 DIAGNOSIS — E039 Hypothyroidism, unspecified: Secondary | ICD-10-CM

## 2019-05-15 NOTE — Telephone Encounter (Signed)
  LAST APPOINTMENT DATE: 02/01/2019  NEXT APPOINTMENT DATE:@6 /09/2019  MEDICATION:levothyroxine (SYNTHROID) 75 MCG tablet  PHARMACY: [id number] - [place], [place] - [address] AT [facility] [address] Phone:  [phone]        COMMENTS: Has TOC scheduled for 06/14/19   **Let patient know to contact pharmacy at the end of the day to make sure medication is ready. **  ** Please notify patient to allow 48-72 hours to process**  **Encourage patient to contact the pharmacy for refills or they can request refills through [facility]**  CLINICAL FILLS OUT ALL BELOW:   LAST REFILL:  QTY:  REFILL DATE:    OTHER COMMENTS:    Okay for refill?  Please advise

## 2019-05-15 NOTE — Telephone Encounter (Signed)
Charlotte please advise. Pt asking for refill:  Levothyroxine tavs 90 t.   1-refill  Last filled-11/09/18  Last Office visit-02/01/19 with Dr. Selena Batten  Pt has a TOC with Claris Gower 06/14/19 but is out of medication.

## 2019-05-15 NOTE — Telephone Encounter (Signed)
Pt scheduled labs to be drawn 05/17/19 @ 9am

## 2019-05-15 NOTE — Telephone Encounter (Signed)
TSh not checked since 03/2018. She needs to schedule lab appt for TSH and T4 check prior to medication refill.

## 2019-05-15 NOTE — Addendum Note (Signed)
Addended by: Michaela Corner on: 05/15/2019 03:31 PM   Modules accepted: Orders

## 2019-05-16 ENCOUNTER — Other Ambulatory Visit: Payer: Self-pay

## 2019-05-17 ENCOUNTER — Other Ambulatory Visit (INDEPENDENT_AMBULATORY_CARE_PROVIDER_SITE_OTHER): Payer: Medicare Other

## 2019-05-17 ENCOUNTER — Telehealth: Payer: Self-pay

## 2019-05-17 DIAGNOSIS — E039 Hypothyroidism, unspecified: Secondary | ICD-10-CM

## 2019-05-17 NOTE — Addendum Note (Signed)
Addended by: Varney Biles on: 05/17/2019 01:24 PM   Modules accepted: Orders

## 2019-05-17 NOTE — Telephone Encounter (Signed)
Main lab called at 1:00p, 05/17/19.  They could not locate patients tube of blood. I called pt and rescheduled her on 05/18/19

## 2019-05-18 ENCOUNTER — Other Ambulatory Visit: Payer: Self-pay

## 2019-05-18 ENCOUNTER — Other Ambulatory Visit (INDEPENDENT_AMBULATORY_CARE_PROVIDER_SITE_OTHER): Payer: Medicare Other

## 2019-05-18 DIAGNOSIS — E039 Hypothyroidism, unspecified: Secondary | ICD-10-CM | POA: Diagnosis not present

## 2019-05-18 LAB — TSH: TSH: 4.03 u[IU]/mL (ref 0.35–4.50)

## 2019-05-18 LAB — T4, FREE: Free T4: 0.67 ng/dL (ref 0.60–1.60)

## 2019-05-18 MED ORDER — LEVOTHYROXINE SODIUM 75 MCG PO TABS: 75.0000 ug | ORAL_TABLET | Freq: Every day | ORAL | 1 refills | Status: DC

## 2019-05-18 NOTE — Addendum Note (Signed)
Addended by: Alysia Penna L on: 05/18/2019 02:52 PM   Modules accepted: Orders

## 2019-06-01 ENCOUNTER — Telehealth: Payer: Self-pay | Admitting: General Practice

## 2019-06-01 NOTE — Telephone Encounter (Signed)
Patient is calling and requesting a refill for amlodipine and lisinopril. Patient has a TOC appointment with Claris Gower on 6/9. CB is [phone]

## 2019-06-06 NOTE — Telephone Encounter (Signed)
Pt notified and pt stated her cardiologist filled all her medications for her and she doesn't need a refill.

## 2019-06-13 ENCOUNTER — Other Ambulatory Visit: Payer: Self-pay

## 2019-06-14 ENCOUNTER — Ambulatory Visit (INDEPENDENT_AMBULATORY_CARE_PROVIDER_SITE_OTHER): Payer: Medicare Other | Admitting: Nurse Practitioner

## 2019-06-14 ENCOUNTER — Encounter: Payer: Self-pay | Admitting: Nurse Practitioner

## 2019-06-14 VITALS — BP 124/70 | HR 81 | Temp 97.0°F | Ht 63.0 in | Wt 189.4 lb

## 2019-06-14 DIAGNOSIS — I10 Essential (primary) hypertension: Secondary | ICD-10-CM | POA: Diagnosis not present

## 2019-06-14 DIAGNOSIS — B354 Tinea corporis: Secondary | ICD-10-CM

## 2019-06-14 DIAGNOSIS — E039 Hypothyroidism, unspecified: Secondary | ICD-10-CM | POA: Diagnosis not present

## 2019-06-14 DIAGNOSIS — E782 Mixed hyperlipidemia: Secondary | ICD-10-CM | POA: Diagnosis not present

## 2019-06-14 DIAGNOSIS — D649 Anemia, unspecified: Secondary | ICD-10-CM | POA: Diagnosis not present

## 2019-06-14 DIAGNOSIS — Z780 Asymptomatic menopausal state: Secondary | ICD-10-CM

## 2019-06-14 LAB — LIPID PANEL
Cholesterol: 152 mg/dL (ref 0–200)
HDL: 63 mg/dL (ref >39.00)
LDL Cholesterol: 61 mg/dL (ref 0–99)
NonHDL: 89.03
Total CHOL/HDL Ratio: 2
Triglycerides: 142 mg/dL (ref 0.0–149.0)
VLDL: 28.4 mg/dL (ref 0.0–40.0)

## 2019-06-14 LAB — COMPREHENSIVE METABOLIC PANEL
ALT: 20 U/L (ref 0–35)
AST: 27 U/L (ref 0–37)
Albumin: 4.6 g/dL (ref 3.5–5.2)
Alkaline Phosphatase: 53 U/L (ref 39–117)
BUN: 22 mg/dL (ref 6–23)
CO2: 27 mEq/L (ref 19–32)
Calcium: 9.6 mg/dL (ref 8.4–10.5)
Chloride: 100 mEq/L (ref 96–112)
Creatinine, Ser: 0.72 mg/dL (ref 0.40–1.20)
GFR: 97.73 mL/min (ref >60.00)
Glucose, Bld: 110 mg/dL — ABNORMAL HIGH (ref 70–99)
Potassium: 4.4 mEq/L (ref 3.5–5.1)
Sodium: 136 mEq/L (ref 135–145)
Total Bilirubin: 0.5 mg/dL (ref 0.2–1.2)
Total Protein: 7.8 g/dL (ref 6.0–8.3)

## 2019-06-14 LAB — CBC
HCT: 35.8 % — ABNORMAL LOW (ref 36.0–46.0)
Hemoglobin: 11.5 g/dL — ABNORMAL LOW (ref 12.0–15.0)
MCHC: 32.2 g/dL (ref 30.0–36.0)
MCV: 95.1 fl (ref 78.0–100.0)
Platelets: 404 10*3/uL — ABNORMAL HIGH (ref 150.0–400.0)
RBC: 3.76 Mil/uL — ABNORMAL LOW (ref 3.87–5.11)
RDW: 13.9 % (ref 11.5–15.5)
WBC: 7.3 10*3/uL (ref 4.0–10.5)

## 2019-06-14 MED ORDER — CLOTRIMAZOLE-BETAMETHASONE 1-0.05 % EX CREA: 1.0000 "application " | TOPICAL_CREAM | Freq: Two times a day (BID) | CUTANEOUS | 0 refills | Status: DC

## 2019-06-14 NOTE — Progress Notes (Signed)
Subjective:  Patient ID: Diamond Hughes, female    DOB: [birth year]  Age: 69 y.o. MRN: [id number]  CC: Establish Care (TOC from [facility]-pt is fasting//)  Rash This is a recurrent problem. The current episode started more than 1 month ago. The problem has been waxing and waning since onset. The affected locations include the left lower leg. The rash is characterized by redness and scaling. It is unknown if there was an exposure to a precipitant. Pertinent negatives include no fatigue or joint pain. Past treatments include moisturizer. The treatment provided no relief. There is no history of eczema.   Hypothyroidism: Stable weight, temperature tolerance and mood No fatigue  HTN, Hyperlipidemia, CAD: Managed by cardiology LDL and BP are at goal BP Readings from Last 3 Encounters:  06/14/19 124/70  04/19/19 (!) 144/82  02/01/19 140/80   Lipid Panel     Component Value Date/Time   CHOL 161 11/30/2018 0923   TRIG 109 11/30/2018 0923   HDL 72 11/30/2018 0923   CHOLHDL 2.2 11/30/2018 0923   CHOLHDL 3 08/09/2017 1357   VLDL 22.4 08/09/2017 1357   LDLCALC 70 11/30/2018 0923   LABVLDL 19 11/30/2018 0923   Reviewed past Medical, Social and Family history today.  Outpatient Medications Prior to Visit  Medication Sig Dispense Refill  . amLODipine (NORVASC) 5 MG tablet TAKE 1 TABLET(5 MG) BY MOUTH DAILY 90 tablet 3  . aspirin 81 MG tablet Take 1 tablet (81 mg total) by mouth daily. 30 tablet 6  . atorvastatin (LIPITOR) 80 MG tablet Take 1 tablet (80 mg total) by mouth daily at 6 PM. 90 tablet 3  . clopidogrel (PLAVIX) 75 MG tablet Take 1 tablet by mouth once daily with breakfast 90 tablet 3  . ezetimibe (ZETIA) 10 MG tablet Take 1 tablet (10 mg total) by mouth daily. 90 tablet 3  . levothyroxine (SYNTHROID) 75 MCG tablet Take 1 tablet (75 mcg total) by mouth daily. 90 tablet 1  . lisinopril (ZESTRIL) 20 MG tablet TAKE 1 TABLET(20 MG) BY MOUTH DAILY 90 tablet 3  . metoprolol tartrate  (LOPRESSOR) 50 MG tablet Take 1 tablet (50 mg total) by mouth 2 (two) times daily. 180 tablet 3  . nitroGLYCERIN (NITROSTAT) 0.4 MG SL tablet Place 1 tablet (0.4 mg total) under the tongue every 5 (five) minutes x 3 doses as needed for chest pain. 25 tablet 3   No facility-administered medications prior to visit.    ROS See HPI  Objective:  BP 124/70   Pulse 81   Temp (!) 97 F (36.1 C) (Tympanic)   Ht 5\' 3"  (1.6 m)   Wt 189 lb 6.4 oz (85.9 kg)   SpO2 97%   BMI 33.55 kg/m   BP Readings from Last 3 Encounters:  06/14/19 124/70  04/19/19 (!) 144/82  02/01/19 140/80    Wt Readings from Last 3 Encounters:  06/14/19 189 lb 6.4 oz (85.9 kg)  04/19/19 191 lb 1.9 oz (86.7 kg)  02/01/19 189 lb 6.4 oz (85.9 kg)    Physical Exam Vitals reviewed.  Constitutional:      Appearance: She is obese.  Cardiovascular:     Rate and Rhythm: Normal rate and regular rhythm.     Pulses: Normal pulses.     Heart sounds: Normal heart sounds.  Pulmonary:     Effort: Pulmonary effort is normal.     Breath sounds: Normal breath sounds.  Musculoskeletal:     Right lower leg: No edema.  Left lower leg: No edema.  Skin:    Findings: Erythema and rash present. Rash is scaling.       Neurological:     Mental Status: She is alert and oriented to person, place, and time.  Psychiatric:        Mood and Affect: Mood normal.        Behavior: Behavior normal.    Lab Results  Component Value Date   WBC 6.4 08/17/2018   HGB 12.0 08/17/2018   HCT 37.0 08/17/2018   PLT 445 08/17/2018   GLUCOSE 122 (H) 08/17/2018   CHOL 161 11/30/2018   TRIG 109 11/30/2018   HDL 72 11/30/2018   LDLCALC 70 11/30/2018   ALT 21 11/30/2018   AST 30 11/30/2018   NA 138 08/17/2018   K 4.2 08/17/2018   CL 98 08/17/2018   CREATININE 0.75 08/17/2018   BUN 14 08/17/2018   CO2 24 08/17/2018   TSH 4.03 05/18/2019   INR 1.00 02/10/2016   HGBA1C 6.4 12/06/2017    Assessment & Plan:  This visit occurred during  the SARS-CoV-2 public health emergency.  Safety protocols were in place, including screening questions prior to the visit, additional usage of staff PPE, and extensive cleaning of exam room while observing appropriate contact time as indicated for disinfecting solutions.   Jenetta was seen today for establish care.  Diagnoses and all orders for this visit:  Essential hypertension -     Comprehensive metabolic panel  Acquired hypothyroidism  Mixed hyperlipidemia -     Comprehensive metabolic panel -     Lipid panel  Asymptomatic postmenopausal estrogen deficiency -     DG Bone Density; Future  Normocytic anemia -     CBC  Tinea corporis -     clotrimazole-betamethasone (LOTRISONE) cream; Apply 1 application topically 2 (two) times daily.   I am having [name] start on clotrimazole-betamethasone. I am also having her maintain her aspirin, nitroGLYCERIN, atorvastatin, metoprolol tartrate, ezetimibe, amLODipine, clopidogrel, lisinopril, and levothyroxine.  Meds ordered this encounter  Medications  . clotrimazole-betamethasone (LOTRISONE) cream    Sig: Apply 1 application topically 2 (two) times daily.    Dispense:  30 g    Refill:  0    Order Specific Question:   Supervising Provider    Answer:   Ronnald Nian [[id number]]    Problem List Items Addressed This Visit      Cardiovascular and Mediastinum   HTN (hypertension) - Primary   Relevant Orders   Comprehensive metabolic panel     Endocrine   Acquired hypothyroidism     Other   Hyperlipidemia   Relevant Orders   Comprehensive metabolic panel   Lipid panel   Normocytic anemia   Relevant Orders   CBC    Other Visit Diagnoses    Asymptomatic postmenopausal estrogen deficiency       Relevant Orders   DG Bone Density   Tinea corporis       Relevant Medications   clotrimazole-betamethasone (LOTRISONE) cream      Follow-up: Return in about 6 months (around 12/14/2019) for  Hypothyroidism.  Wilfred Lacy, NP

## 2019-06-14 NOTE — Patient Instructions (Addendum)
Go to lab for blood draw  You will be contacted to schedule appt for bone density  Let me know if rash on leg does not improve in 2weeks.  Maintain calcium 600mg  BID and vitamin D 1000IU daily for bone health.

## 2019-07-18 ENCOUNTER — Other Ambulatory Visit: Payer: Self-pay

## 2019-07-18 ENCOUNTER — Ambulatory Visit
Admission: RE | Admit: 2019-07-18 | Discharge: 2019-07-18 | Disposition: A | Discharge status: Home / Self Care | Payer: Medicare Other | Source: Ambulatory Visit | Attending: Nurse Practitioner | Admitting: Nurse Practitioner

## 2019-07-18 DIAGNOSIS — Z780 Asymptomatic menopausal state: Secondary | ICD-10-CM

## 2019-07-25 ENCOUNTER — Telehealth: Payer: Self-pay | Admitting: Nurse Practitioner

## 2019-07-25 NOTE — Telephone Encounter (Signed)
Patient is calling to see if Bone Density results were back, please advise. CB is [phone]

## 2019-07-25 NOTE — Telephone Encounter (Signed)
Pt was notified and verbally understood results. °

## 2019-08-11 ENCOUNTER — Other Ambulatory Visit: Payer: Self-pay | Admitting: Family Medicine

## 2019-08-28 ENCOUNTER — Other Ambulatory Visit: Payer: Self-pay | Admitting: Physician Assistant

## 2019-10-21 ENCOUNTER — Ambulatory Visit: Payer: Medicare Other

## 2019-12-06 ENCOUNTER — Other Ambulatory Visit: Payer: Self-pay | Admitting: Nurse Practitioner

## 2020-03-11 DIAGNOSIS — H524 Presbyopia: Secondary | ICD-10-CM | POA: Diagnosis not present

## 2020-03-11 DIAGNOSIS — H2513 Age-related nuclear cataract, bilateral: Secondary | ICD-10-CM | POA: Diagnosis not present

## 2020-03-11 DIAGNOSIS — H52223 Regular astigmatism, bilateral: Secondary | ICD-10-CM | POA: Diagnosis not present

## 2020-03-11 DIAGNOSIS — H5203 Hypermetropia, bilateral: Secondary | ICD-10-CM | POA: Diagnosis not present

## 2020-03-12 ENCOUNTER — Other Ambulatory Visit: Payer: Self-pay

## 2020-03-13 ENCOUNTER — Encounter: Payer: Self-pay | Admitting: Nurse Practitioner

## 2020-03-13 ENCOUNTER — Other Ambulatory Visit (HOSPITAL_COMMUNITY)
Admission: RE | Admit: 2020-03-13 | Discharge: 2020-03-13 | Disposition: A | Discharge status: Home / Self Care | Payer: Medicare Other | Source: Ambulatory Visit | Attending: Nurse Practitioner | Admitting: Nurse Practitioner

## 2020-03-13 ENCOUNTER — Ambulatory Visit (INDEPENDENT_AMBULATORY_CARE_PROVIDER_SITE_OTHER): Payer: Medicare Other | Admitting: Nurse Practitioner

## 2020-03-13 VITALS — BP 124/78 | HR 93 | Temp 97.0°F | Ht 62.5 in | Wt 185.2 lb

## 2020-03-13 DIAGNOSIS — I10 Essential (primary) hypertension: Secondary | ICD-10-CM | POA: Diagnosis not present

## 2020-03-13 DIAGNOSIS — Z1211 Encounter for screening for malignant neoplasm of colon: Secondary | ICD-10-CM | POA: Diagnosis not present

## 2020-03-13 DIAGNOSIS — E782 Mixed hyperlipidemia: Secondary | ICD-10-CM

## 2020-03-13 DIAGNOSIS — Z1151 Encounter for screening for human papillomavirus (HPV): Secondary | ICD-10-CM | POA: Diagnosis not present

## 2020-03-13 DIAGNOSIS — E039 Hypothyroidism, unspecified: Secondary | ICD-10-CM

## 2020-03-13 DIAGNOSIS — R739 Hyperglycemia, unspecified: Secondary | ICD-10-CM

## 2020-03-13 DIAGNOSIS — Z0000 Encounter for general adult medical examination without abnormal findings: Secondary | ICD-10-CM | POA: Diagnosis not present

## 2020-03-13 DIAGNOSIS — E1165 Type 2 diabetes mellitus with hyperglycemia: Secondary | ICD-10-CM | POA: Diagnosis not present

## 2020-03-13 DIAGNOSIS — Z1231 Encounter for screening mammogram for malignant neoplasm of breast: Secondary | ICD-10-CM | POA: Diagnosis not present

## 2020-03-13 DIAGNOSIS — Z124 Encounter for screening for malignant neoplasm of cervix: Secondary | ICD-10-CM | POA: Insufficient documentation

## 2020-03-13 DIAGNOSIS — Z0001 Encounter for general adult medical examination with abnormal findings: Secondary | ICD-10-CM

## 2020-03-13 NOTE — Patient Instructions (Addendum)
Go to lab for blood draw  Schedule appt for annual mammogram  you will be contacted to verify your information for repeat cologuard.  Preventive Care 68 Years and Older, Female Preventive care refers to lifestyle choices and visits with your health care provider that can promote health and wellness. This includes:  A yearly physical exam. This is also called an annual wellness visit.  Regular dental and eye exams.  Immunizations.  Screening for certain conditions.  Healthy lifestyle choices, such as: ? Eating a healthy diet. ? Getting regular exercise. ? Not using drugs or products that contain nicotine and tobacco. ? Limiting alcohol use. What can I expect for my preventive care visit? Physical exam Your health care provider will check your:  Height and weight. These may be used to calculate your BMI (body mass index). BMI is a measurement that tells if you are at a healthy weight.  Heart rate and blood pressure.  Body temperature.  Skin for abnormal spots. Counseling Your health care provider may ask you questions about your:  Past medical problems.  Family's medical history.  Alcohol, tobacco, and drug use.  Emotional well-being.  Home life and relationship well-being.  Sexual activity.  Diet, exercise, and sleep habits.  History of falls.  Memory and ability to understand (cognition).  Work and work Statistician.  Pregnancy and menstrual history.  Access to firearms. What immunizations do I need? Vaccines are usually given at various ages, according to a schedule. Your health care provider will recommend vaccines for you based on your age, medical history, and lifestyle or other factors, such as travel or where you work.   What tests do I need? Blood tests  Lipid and cholesterol levels. These may be checked every 5 years, or more often depending on your overall health.  Hepatitis C test.  Hepatitis B test. Screening  Lung cancer screening. You  may have this screening every year starting at age 68 if you have a 30-pack-year history of smoking and currently smoke or have quit within the past 15 years.  Colorectal cancer screening. ? All adults should have this screening starting at age 688 and continuing until age 68. ? Your health care provider may recommend screening at age 46 if you are at increased risk. ? You will have tests every 1-10 years, depending on your results and the type of screening test.  Diabetes screening. ? This is done by checking your blood sugar (glucose) after you have not eaten for a while (fasting). ? You may have this done every 1-3 years.  Mammogram. ? This may be done every 1-2 years. ? Talk with your health care provider about how often you should have regular mammograms.  Abdominal aortic aneurysm (AAA) screening. You may need this if you are a current or former smoker.  BRCA-related cancer screening. This may be done if you have a family history of breast, ovarian, tubal, or peritoneal cancers. Other tests  STD (sexually transmitted disease) testing, if you are at risk.  Bone density scan. This is done to screen for osteoporosis. You may have this done starting at age 688. Talk with your health care provider about your test results, treatment options, and if necessary, the need for more tests. Follow these instructions at home: Eating and drinking  Eat a diet that includes fresh fruits and vegetables, whole grains, lean protein, and low-fat dairy products. Limit your intake of foods with high amounts of sugar, saturated fats, and salt.  Take vitamin and  mineral supplements as recommended by your health care provider.  Do not drink alcohol if your health care provider tells you not to drink.  If you drink alcohol: ? Limit how much you have to 0-1 drink a day. ? Be aware of how much alcohol is in your drink. In the [place] one drink equals one 12 oz bottle of beer (355 mL), one 5 oz glass of wine  (148 mL), or one 1 oz glass of hard liquor (44 mL).   Lifestyle  Take daily care of your teeth and gums. Brush your teeth every morning and night with fluoride toothpaste. Floss one time each day.  Stay active. Exercise for at least 30 minutes 5 or more days each week.  Do not use any products that contain nicotine or tobacco, such as cigarettes, e-cigarettes, and chewing tobacco. If you need help quitting, ask your health care provider.  Do not use drugs.  If you are sexually active, practice safe sex. Use a condom or other form of protection in order to prevent STIs (sexually transmitted infections).  Talk with your health care provider about taking a low-dose aspirin or statin.  Find healthy ways to cope with stress, such as: ? Meditation, yoga, or listening to music. ? Journaling. ? Talking to a trusted person. ? Spending time with friends and family. Safety  Always wear your seat belt while driving or riding in a vehicle.  Do not drive: ? If you have been drinking alcohol. Do not ride with someone who has been drinking. ? When you are tired or distracted. ? While texting.  Wear a helmet and other protective equipment during sports activities.  If you have firearms in your house, make sure you follow all gun safety procedures. What's next?  Visit your health care provider once a year for an annual wellness visit.  Ask your health care provider how often you should have your eyes and teeth checked.  Stay up to date on all vaccines. This information is not intended to replace advice given to you by your health care provider. Make sure you discuss any questions you have with your health care provider. Document Revised: 12/13/2019 Document Reviewed: 12/16/2017 Elsevier Patient Education  2021 [employer].

## 2020-03-13 NOTE — Assessment & Plan Note (Addendum)
Repeat lipid panel: LDL at goal with atorvastatin

## 2020-03-13 NOTE — Progress Notes (Signed)
Subjective:    Patient ID: Diamond Hughes, female    DOB: May 21, 1950, 70 y.o.   MRN: [id number]  Patient presents today for CPE eval of chronic conditions  HPI HTN (hypertension) Stable BP with amlodipine, lisinopril and metoprolol Also under the care of cardiology. BP Readings from Last 3 Encounters:  03/13/20 140/80  06/14/19 124/70  04/19/19 (!) 144/82   Repeat BMP and maintain appts with cardiology  Acquired hypothyroidism Repeat TSH and T4  Hyperlipidemia Repeat lipid panel: LDL at goal with atorvastatin  Type 2 diabetes mellitus with hyperglycemia, without long-term current use of insulin (HCC) Stable lab results with HgbA1c at 6.6. This indicates DM. With hx of heart disease, HTN and hyperlipidemia; I recommend use of low dose metformin. New rx sent. Also maintain low carb diet and regular exercise. F/up in 22month (fasting) cardio appt coming up.  BP Readings from Last 3 Encounters:  03/14/20 124/78  06/14/19 124/70  04/19/19 (!) 144/82   Depression/Suicide: Depression screen PHQ 2/9 03/13/2020 06/14/2019 01/25/2019 03/18/2018 08/09/2017 02/13/2016  Decreased Interest 0 0 0 0 0 0  Down, Depressed, Hopeless 0 0 0 0 0 0  PHQ - 2 Score 0 0 0 0 0 0   Vision:up to date  Dental:up to date  Immunizations: (TDAP, Hep C screen, Pneumovax, Influenza, zoster)  Health Maintenance  Topic Date Due  . Complete foot exam   Never done  . Eye exam for diabetics  Never done  . Mammogram  11/20/2019  . Flu Shot  04/04/2020*  . Tetanus Vaccine  06/13/2020*  . Pneumonia vaccines (1 of 2 - PCV13) 06/13/2020*  . Hemoglobin A1C  09/13/2020  . Cologuard (Stool DNA test)  11/05/2020  . DEXA scan (bone density measurement)  Completed  . COVID-19 Vaccine  Completed  .  Hepatitis C: One time screening is recommended by Center for Disease Control  (CDC) for  adults born from [birth year] through 1963.   Completed  . HPV Vaccine  Aged Out  *Topic was postponed. The date shown is not the original  due date.   Diet:regular Exercise: none Weight:  Wt Readings from Last 3 Encounters:  03/13/20 185 lb 3.2 oz (84 kg)  06/14/19 189 lb 6.4 oz (85.9 kg)  04/19/19 191 lb 1.9 oz (86.7 kg)   Fall Risk: Fall Risk  06/14/2019 01/25/2019 03/18/2018 08/09/2017  Falls in the past year? 0 0 0 No  Number falls in past yr: 0 0 - -  Injury with Fall? 0 0 - -  Follow up - Education provided;Falls prevention discussed - -   Medications and allergies reviewed with patient and updated if appropriate.  Patient Active Problem List   Diagnosis Date Noted  . Type 2 diabetes mellitus with hyperglycemia, without long-term current use of insulin (HCC) 03/14/2020  . Sciatica without lumbago 02/01/2019  . Acquired hypothyroidism 12/06/2017  . Ischemic cardiomyopathy 02/20/2016  . Hyperlipidemia 02/12/2016  . CAD (coronary artery disease) 02/12/2016  . Normocytic anemia 02/12/2016  . NSTEMI (non-ST elevated myocardial infarction) (HCC) 02/08/2016  . HTN (hypertension) 02/10/2012    Current Outpatient Medications on File Prior to Visit  Medication Sig Dispense Refill  . amLODipine (NORVASC) 5 MG tablet TAKE 1 TABLET(5 MG) BY MOUTH DAILY 90 tablet 3  . aspirin 81 MG tablet Take 1 tablet (81 mg total) by mouth daily. 30 tablet 6  . atorvastatin (LIPITOR) 80 MG tablet Take 1 tablet (80 mg total) by mouth daily at 6 PM. 90 tablet 3  .  clopidogrel (PLAVIX) 75 MG tablet Take 1 tablet by mouth once daily with breakfast 90 tablet 3  . clotrimazole-betamethasone (LOTRISONE) cream Apply 1 application topically 2 (two) times daily. 30 g 0  . ezetimibe (ZETIA) 10 MG tablet Take 1 tablet (10 mg total) by mouth daily. 90 tablet 3  . lisinopril (ZESTRIL) 20 MG tablet TAKE 1 TABLET(20 MG) BY MOUTH DAILY 90 tablet 3  . metoprolol tartrate (LOPRESSOR) 50 MG tablet Take 1 tablet (50 mg total) by mouth 2 (two) times daily. 180 tablet 3  . nitroGLYCERIN (NITROSTAT) 0.4 MG SL tablet Place 1 tablet (0.4 mg total) under the tongue  every 5 (five) minutes x 3 doses as needed for chest pain. 25 tablet 3   No current facility-administered medications on file prior to visit.    Past Medical History:  Diagnosis Date  . CAD (coronary artery disease)    a. 2000 s/p MI and RCA PCI [facility]);  b. 02/2016 NSTEMI/PCI: LM nl, LAD nl, LCX 100p (3.5x18 Resolute Onyx DES), OM1/3 small, RCA 70p aneurysmal, 2m/d ISR-->attempted PCI, could not cross w/ wire-->med rx, EF 45-50%.  . Hyperlipidemia   . Hypertension   . Ischemic cardiomyopathy    a. 02/2016 Echo: EF 45-50%, basal-mid inferior/posterior HK, Gr1 DD, triv MR, nl LA size, nl RV fxn.  . Normocytic anemia     Past Surgical History:  Procedure Laterality Date  . ANGIOPLASTY  2000  . CORONARY STENT INTERVENTION N/A 02/10/2016   Procedure: Coronary Stent Intervention;  Surgeon: Lyn Records, MD;  Location: [facility] INVASIVE CV LAB;  Service: Cardiovascular;  Laterality: N/A;  . LEFT HEART CATH AND CORONARY ANGIOGRAPHY N/A 02/10/2016   Procedure: Left Heart Cath and Coronary Angiography;  Surgeon: Lyn Records, MD;  Location: [facility] INVASIVE CV LAB;  Service: Cardiovascular;  Laterality: N/A;   Social History   Socioeconomic History  . Marital status: Married    Spouse name: Not on file  . Number of children: Not on file  . Years of education: Not on file  . Highest education level: Not on file  Occupational History  . Not on file  Tobacco Use  . Smoking status: Former Smoker    Packs/day: 0.20    Years: 15.00    Pack years: 3.00    Types: Cigarettes  . Smokeless tobacco: Never Used  Vaping Use  . Vaping Use: Never used  Substance and Sexual Activity  . Alcohol use: Yes    Comment: 40oz beer  . Drug use: No  . Sexual activity: Yes    Birth control/protection: None  Other Topics Concern  . Not on file  Social History Narrative  . Not on file   Social Determinants of Health   Financial Resource Strain: Not on file  Food Insecurity: Not on file  Transportation  Needs: Not on file  Physical Activity: Not on file  Stress: Not on file  Social Connections: Not on file   Family History  Problem Relation Age of Onset  . Heart disease Mother   . Hyperlipidemia Mother   . Hypertension Mother   . Hyperlipidemia Sister   . Hypertension Sister   . Breast cancer Maternal Grandmother        Review of Systems  Constitutional: Negative for fever, malaise/fatigue and weight loss.  HENT: Negative for congestion and sore throat.   Eyes:       Negative for visual changes  Respiratory: Negative for cough and shortness of breath.   Cardiovascular:  Negative for chest pain, palpitations and leg swelling.  Gastrointestinal: Negative for blood in stool, constipation, diarrhea and heartburn.  Genitourinary: Negative for dysuria, frequency and urgency.  Musculoskeletal: Negative for falls, joint pain and myalgias.  Skin: Negative for rash.  Neurological: Negative for dizziness, sensory change and headaches.  Endo/Heme/Allergies: Does not bruise/bleed easily.  Psychiatric/Behavioral: Negative for depression, substance abuse and suicidal ideas. The patient is not nervous/anxious.    Objective:   Vitals:   03/13/20 1259 03/14/20 0851  BP: 140/80 124/78  Pulse: 93   Temp: (!) 97 F (36.1 C)   SpO2: 98%    Body mass index is 33.33 kg/m.  Physical Examination:  Physical Exam Vitals reviewed. Exam conducted with a chaperone present.  Constitutional:      General: She is not in acute distress.    Appearance: She is well-developed. She is obese.  HENT:     Right Ear: Tympanic membrane, ear canal and external ear normal.     Left Ear: Tympanic membrane, ear canal and external ear normal.     Mouth/Throat:     Mouth: Oropharynx is clear and moist.  Eyes:     Extraocular Movements: Extraocular movements intact and EOM normal.     Conjunctiva/sclera: Conjunctivae normal.  Cardiovascular:     Rate and Rhythm: Normal rate and regular rhythm.     Pulses:  Normal pulses.     Heart sounds: Normal heart sounds.  Pulmonary:     Effort: Pulmonary effort is normal. No respiratory distress.     Breath sounds: Normal breath sounds.  Chest:     Chest wall: No tenderness.     Comments: declined Abdominal:     General: Bowel sounds are normal.     Palpations: Abdomen is soft.     Hernia: There is no hernia in the left inguinal area or right inguinal area.  Genitourinary:    Labia:        Right: No rash or tenderness.        Left: No rash or tenderness.      Vagina: Normal.     Cervix: Normal.     Uterus: Normal.      Adnexa: Right adnexa normal and left adnexa normal.     Rectum: External hemorrhoid present.  Musculoskeletal:        General: No edema. Normal range of motion.     Cervical back: Normal range of motion and neck supple.     Right lower leg: No edema.     Left lower leg: No edema.  Lymphadenopathy:     Lower Body: No right inguinal adenopathy. No left inguinal adenopathy.  Skin:    General: Skin is warm and dry.  Neurological:     Mental Status: She is alert and oriented to person, place, and time.     Deep Tendon Reflexes: Reflexes are normal and symmetric.  Psychiatric:        Mood and Affect: Mood normal.        Behavior: Behavior normal.        Thought Content: Thought content normal.    ASSESSMENT and PLAN: This visit occurred during the SARS-CoV-2 public health emergency.  Safety protocols were in place, including screening questions prior to the visit, additional usage of staff PPE, and extensive cleaning of exam room while observing appropriate contact time as indicated for disinfecting solutions.   Diamond Hughes was seen today for annual exam.  Diagnoses and all orders for this visit:  Encounter  for preventative adult health care exam with abnormal findings -     Cologuard; Future -     CBC with Differential/Platelet -     Comprehensive metabolic panel -     MM 3D SCREEN BREAST BILATERAL; Future -     Cytology -  PAP( [place])  Colon cancer screening -     Cologuard; Future  Mixed hyperlipidemia -     Lipid panel  Acquired hypothyroidism -     TSH -     T4, free -     levothyroxine (SYNTHROID) 75 MCG tablet; Take 1 tablet (75 mcg total) by mouth daily before breakfast.  Type 2 diabetes mellitus with hyperglycemia, without long-term current use of insulin (HCC) -     Hemoglobin A1c -     metFORMIN (GLUCOPHAGE) 500 MG tablet; Take 0.5 tablets (250 mg total) by mouth daily with breakfast.  Breast cancer screening by mammogram -     MM 3D SCREEN BREAST BILATERAL; Future  Encounter for Papanicolaou smear for cervical cancer screening -     Cytology - PAP( [place])  Primary hypertension  Schedule appt for annual mammogram  you will be contacted to verify your information for repeat cologuard.    Problem List Items Addressed This Visit      Cardiovascular and Mediastinum   HTN (hypertension)    Stable BP with amlodipine, lisinopril and metoprolol Also under the care of cardiology. BP Readings from Last 3 Encounters:  03/13/20 140/80  06/14/19 124/70  04/19/19 (!) 144/82   Repeat BMP and maintain appts with cardiology        Endocrine   Acquired hypothyroidism    Repeat TSH and T4      Relevant Medications   levothyroxine (SYNTHROID) 75 MCG tablet   Other Relevant Orders   TSH (Completed)   T4, free (Completed)   Type 2 diabetes mellitus with hyperglycemia, without long-term current use of insulin (HCC)    Stable lab results with HgbA1c at 6.6. This indicates DM. With hx of heart disease, HTN and hyperlipidemia; I recommend use of low dose metformin. New rx sent. Also maintain low carb diet and regular exercise. F/up in 42month (fasting)      Relevant Medications   metFORMIN (GLUCOPHAGE) 500 MG tablet   Other Relevant Orders   Hemoglobin A1c (Completed)     Other   Hyperlipidemia    Repeat lipid panel: LDL at goal with atorvastatin      Relevant Orders    Lipid panel (Completed)    Other Visit Diagnoses    Encounter for preventative adult health care exam with abnormal findings    -  Primary   Relevant Orders   Cologuard   CBC with Differential/Platelet (Completed)   Comprehensive metabolic panel (Completed)   MM 3D SCREEN BREAST BILATERAL   Cytology - PAP( [place]) (Completed)   Colon cancer screening       Relevant Orders   Cologuard   Breast cancer screening by mammogram       Relevant Orders   MM 3D SCREEN BREAST BILATERAL   Encounter for Papanicolaou smear for cervical cancer screening       Relevant Orders   Cytology - PAP( [place]) (Completed)      Follow up: Return in about 3 months (around 06/13/2020) for DM (repeat BMP, hgbA1c and collect urine).  Alysia Penna, NP

## 2020-03-13 NOTE — Assessment & Plan Note (Signed)
Repeat TSH and T4 °

## 2020-03-13 NOTE — Assessment & Plan Note (Signed)
Stable BP with amlodipine, lisinopril and metoprolol Also under the care of cardiology. BP Readings from Last 3 Encounters:  03/13/20 140/80  06/14/19 124/70  04/19/19 (!) 144/82   Repeat BMP and maintain appts with cardiology

## 2020-03-14 DIAGNOSIS — E119 Type 2 diabetes mellitus without complications: Secondary | ICD-10-CM | POA: Insufficient documentation

## 2020-03-14 DIAGNOSIS — E1165 Type 2 diabetes mellitus with hyperglycemia: Secondary | ICD-10-CM | POA: Insufficient documentation

## 2020-03-14 LAB — COMPREHENSIVE METABOLIC PANEL
ALT: 15 U/L (ref 0–35)
AST: 19 U/L (ref 0–37)
Albumin: 4.5 g/dL (ref 3.5–5.2)
Alkaline Phosphatase: 50 U/L (ref 39–117)
BUN: 19 mg/dL (ref 6–23)
CO2: 31 mEq/L (ref 19–32)
Calcium: 10.7 mg/dL — ABNORMAL HIGH (ref 8.4–10.5)
Chloride: 98 mEq/L (ref 96–112)
Creatinine, Ser: 0.76 mg/dL (ref 0.40–1.20)
GFR: 80.83 mL/min (ref >60.00)
Glucose, Bld: 104 mg/dL — ABNORMAL HIGH (ref 70–99)
Potassium: 4.3 mEq/L (ref 3.5–5.1)
Sodium: 138 mEq/L (ref 135–145)
Total Bilirubin: 0.5 mg/dL (ref 0.2–1.2)
Total Protein: 8.2 g/dL (ref 6.0–8.3)

## 2020-03-14 LAB — CYTOLOGY - PAP
Adequacy: ABSENT
Comment: NEGATIVE
Diagnosis: NEGATIVE
High risk HPV: NEGATIVE

## 2020-03-14 LAB — TSH: TSH: 4.23 u[IU]/mL (ref 0.35–4.50)

## 2020-03-14 LAB — CBC WITH DIFFERENTIAL/PLATELET
Basophils Absolute: 0.1 10*3/uL (ref 0.0–0.1)
Basophils Relative: 0.9 % (ref 0.0–3.0)
Eosinophils Absolute: 0.2 10*3/uL (ref 0.0–0.7)
Eosinophils Relative: 2.8 % (ref 0.0–5.0)
HCT: 37 % (ref 36.0–46.0)
Hemoglobin: 12 g/dL (ref 12.0–15.0)
Lymphocytes Relative: 22.2 % (ref 12.0–46.0)
Lymphs Abs: 1.5 10*3/uL (ref 0.7–4.0)
MCHC: 32.4 g/dL (ref 30.0–36.0)
MCV: 94.9 fl (ref 78.0–100.0)
Monocytes Absolute: 0.6 10*3/uL (ref 0.1–1.0)
Monocytes Relative: 9.2 % (ref 3.0–12.0)
Neutro Abs: 4.5 10*3/uL (ref 1.4–7.7)
Neutrophils Relative %: 64.9 % (ref 43.0–77.0)
Platelets: 421 10*3/uL — ABNORMAL HIGH (ref 150.0–400.0)
RBC: 3.9 Mil/uL (ref 3.87–5.11)
RDW: 14.6 % (ref 11.5–15.5)
WBC: 6.9 10*3/uL (ref 4.0–10.5)

## 2020-03-14 LAB — LIPID PANEL
Cholesterol: 168 mg/dL (ref 0–200)
HDL: 75.4 mg/dL (ref >39.00)
LDL Cholesterol: 69 mg/dL (ref 0–99)
NonHDL: 92.94
Total CHOL/HDL Ratio: 2
Triglycerides: 119 mg/dL (ref 0.0–149.0)
VLDL: 23.8 mg/dL (ref 0.0–40.0)

## 2020-03-14 LAB — T4, FREE: Free T4: 0.74 ng/dL (ref 0.60–1.60)

## 2020-03-14 LAB — HEMOGLOBIN A1C: Hgb A1c MFr Bld: 6.6 % — ABNORMAL HIGH (ref 4.6–6.5)

## 2020-03-14 MED ORDER — METFORMIN HCL 500 MG PO TABS: 250.0000 mg | ORAL_TABLET | Freq: Every day | ORAL | 1 refills | Status: DC

## 2020-03-14 MED ORDER — LEVOTHYROXINE SODIUM 75 MCG PO TABS: 75.0000 ug | ORAL_TABLET | Freq: Every day | ORAL | 1 refills | Status: DC

## 2020-03-14 NOTE — Assessment & Plan Note (Signed)
Stable lab results with HgbA1c at 6.6. This indicates DM. With hx of heart disease, HTN and hyperlipidemia; I recommend use of low dose metformin. New rx sent. Also maintain low carb diet and regular exercise. F/up in 60month (fasting)

## 2020-04-18 ENCOUNTER — Ambulatory Visit (INDEPENDENT_AMBULATORY_CARE_PROVIDER_SITE_OTHER): Payer: Medicare Other | Admitting: Physician Assistant

## 2020-04-18 ENCOUNTER — Other Ambulatory Visit: Payer: Self-pay

## 2020-04-18 ENCOUNTER — Encounter: Payer: Self-pay | Admitting: Cardiology

## 2020-04-18 ENCOUNTER — Telehealth: Payer: Self-pay | Admitting: Pharmacist

## 2020-04-18 ENCOUNTER — Encounter: Payer: Self-pay | Admitting: Physician Assistant

## 2020-04-18 VITALS — BP 148/88 | HR 75 | Ht 63.0 in | Wt 186.8 lb

## 2020-04-18 DIAGNOSIS — E119 Type 2 diabetes mellitus without complications: Secondary | ICD-10-CM

## 2020-04-18 DIAGNOSIS — E785 Hyperlipidemia, unspecified: Secondary | ICD-10-CM | POA: Diagnosis not present

## 2020-04-18 DIAGNOSIS — I2510 Atherosclerotic heart disease of native coronary artery without angina pectoris: Secondary | ICD-10-CM | POA: Diagnosis not present

## 2020-04-18 DIAGNOSIS — I10 Essential (primary) hypertension: Secondary | ICD-10-CM

## 2020-04-18 DIAGNOSIS — E663 Overweight: Secondary | ICD-10-CM | POA: Insufficient documentation

## 2020-04-18 MED ORDER — METOPROLOL TARTRATE 50 MG PO TABS: 50.0000 mg | ORAL_TABLET | Freq: Two times a day (BID) | ORAL | 3 refills | Status: DC

## 2020-04-18 MED ORDER — AMLODIPINE BESYLATE 5 MG PO TABS: ORAL_TABLET | ORAL | 3 refills | Status: DC

## 2020-04-18 MED ORDER — NITROGLYCERIN 0.4 MG SL SUBL: 0.4000 mg | SUBLINGUAL_TABLET | SUBLINGUAL | 3 refills | Status: DC | PRN

## 2020-04-18 MED ORDER — CLOPIDOGREL BISULFATE 75 MG PO TABS: ORAL_TABLET | ORAL | 3 refills | Status: DC

## 2020-04-18 MED ORDER — EZETIMIBE 10 MG PO TABS: 10.0000 mg | ORAL_TABLET | Freq: Every day | ORAL | 3 refills | Status: DC

## 2020-04-18 MED ORDER — LISINOPRIL 20 MG PO TABS: ORAL_TABLET | ORAL | 3 refills | Status: DC

## 2020-04-18 MED ORDER — ATORVASTATIN CALCIUM 80 MG PO TABS: 80.0000 mg | ORAL_TABLET | Freq: Every day | ORAL | 3 refills | Status: DC

## 2020-04-18 NOTE — Telephone Encounter (Signed)
Pt seen today in office and inquired about taking Golo supplementation to help with weight loss. She would benefit more so from starting GLP1-RA therapy since she also has a history of CAD and DM (A1c 6.6% on 03/13/20, pt subsequently started on metformin). Her insurance covers Ozempic which would help with weight loss, CV risk reduction, and A1c lowering. Copay is $47/month or $131/3 month supply, which is actually cheaper than the Golo supplement (1 bottle costs $60). Discussed with pt that Golo supplementation has not been reviewed by the FDA so efficacy and safety data is not regulated or confirmed. Pt was very appreciative for phone call and would like to try Ozempic. No personal/family history of thyroid cancer. Scheduled pt to meet with PharmD on 4/18 for med counseling and first injection.

## 2020-04-18 NOTE — Addendum Note (Signed)
Addended by: Juanda Crumble on: 04/18/2020 11:03 AM   Modules accepted: Level of Service, SmartSet

## 2020-04-18 NOTE — Telephone Encounter (Signed)
Thank you for discussing Ozempic with the patient.  She is a great candidate.

## 2020-04-18 NOTE — Telephone Encounter (Signed)
This encounter was created in error - please disregard.

## 2020-04-18 NOTE — Patient Instructions (Signed)
Medication Instructions:  Your physician recommends that you continue on your current medications as directed. Please refer to the Current Medication list given to you today.  *If you need a refill on your cardiac medications before your next appointment, please call your pharmacy*   Lab Work: None ordered  If you have labs (blood work) drawn today and your tests are completely normal, you will receive your results only by: . MyChart Message (if you have MyChart) OR . A paper copy in the mail If you have any lab test that is abnormal or we need to change your treatment, we will call you to review the results.   Testing/Procedures: None ordered   Follow-Up: At [facility], you and your health needs are our priority.  As part of our continuing mission to provide you with exceptional heart care, we have created designated Provider Care Teams.  These Care Teams include your primary Cardiologist (physician) and Advanced Practice Providers (APPs -  Physician Assistants and Nurse Practitioners) who all work together to provide you with the care you need, when you need it.  We recommend signing up for the patient portal called "MyChart".  Sign up information is provided on this After Visit Summary.  MyChart is used to connect with patients for Virtual Visits (Telemedicine).  Patients are able to view lab/test results, encounter notes, upcoming appointments, etc.  Non-urgent messages can be sent to your provider as well.   To learn more about what you can do with MyChart, go to [email or web address].    Your next appointment:   12 month(s)  The format for your next appointment:   In Person  Provider:   You may see Christopher McAlhany, MD or one of the following Advanced Practice Providers on your designated Care Team:    Dayna Dunn, PA-C  Michele Lenze, PA-C    Other Instructions   

## 2020-04-18 NOTE — Progress Notes (Addendum)
Cardiology Office Note:    Date:  04/18/2020   ID:  PIPPA HANIF, DOB [birth year], MRN [id number]  PCP:  Anne Ng, NP    [facility]  Cardiologist:  Verne Carrow, MD   Referring MD: Anne Ng, NP   Chief Complaint  Patient presents with  . Follow-up         History of Present Illness:    Diamond Hughes is a 70 y.o. female with a hx of CAD status post MI treated with stent to the RCA in 2000 in [place]. 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 She had DES to the circumflex and failed angioplasty to the RCA in-stent restenosis and distal de novo disease due to inability to cross the balloon. Medical therapy recommended for the RCA lesion. 2D echo 2/2019showed LVEF 45 to 50% with basal to mid inferior posterior hypokinesis and grade 1 DD. Frequent PVCs on prior EKGs and was advised to reduce caffeine and alcohol consumption.  Today, she comes in alone.  She feels well.  Denies chest pain, SOB, PND, orthopnea, pedal edema, lightheadedness, syncope and bleeding issues.  No heartburn symptoms (which is her anginal equivalent).  Denies any issue with her medications.  In March 2022, she was found to have A1C 6.6% and she was started on Metformin this month.  She is committed to increase her exercise and improve her diet.  She joined a gym and has a stationary bike.  She lives on the 3rd floor and is able to carry groceries up 3 flights of stairs without stopping/difficulty.  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  She states her BP at home is usually 117-135/70-82.  Pt asks about starting supplement called Golo.  Past Medical History:  Diagnosis Date  . CAD (coronary artery  disease)    a. 2000 s/p MI and RCA PCI [facility]);  b. 02/2016 NSTEMI/PCI: LM nl, LAD nl, LCX 100p (3.5x18 Resolute Onyx DES), OM1/3 small, RCA 70p aneurysmal, 69m/d ISR-->attempted PCI, could not cross w/ wire-->med rx, EF 45-50%.  . Hyperlipidemia   . Hypertension   . Ischemic cardiomyopathy    a. 02/2016 Echo: EF 45-50%, basal-mid inferior/posterior HK, Gr1 DD, triv MR, nl LA size, nl RV fxn.  . Normocytic anemia     Past Surgical History:  Procedure Laterality Date  . ANGIOPLASTY  2000  . CORONARY STENT INTERVENTION N/A 02/10/2016   Procedure: Coronary Stent Intervention;  Surgeon: Lyn Records, MD;  Location: [facility] INVASIVE CV LAB;  Service: Cardiovascular;  Laterality: N/A;  . LEFT HEART CATH AND CORONARY ANGIOGRAPHY N/A 02/10/2016   Procedure: Left Heart Cath and Coronary Angiography;  Surgeon: Lyn Records, MD;  Location: [facility] INVASIVE CV LAB;  Service: Cardiovascular;  Laterality: N/A;    Current Medications: Current Meds  Medication Sig  . aspirin 81 MG tablet Take 1 tablet (81 mg total) by mouth daily.  . clotrimazole-betamethasone (LOTRISONE) cream Apply 1 application topically 2 (two) times daily.  Marland Kitchen levothyroxine (SYNTHROID) 75 MCG tablet Take 1 tablet (75 mcg total) by mouth daily before breakfast.  . metFORMIN (GLUCOPHAGE) 500 MG tablet Take 0.5 tablets (250 mg total) by mouth daily with breakfast.  . [DISCONTINUED] amLODipine (NORVASC) 5  MG tablet TAKE 1 TABLET(5 MG) BY MOUTH DAILY  . [DISCONTINUED] atorvastatin (LIPITOR) 80 MG tablet Take 1 tablet (80 mg total) by mouth daily at 6 PM.  . [DISCONTINUED] clopidogrel (PLAVIX) 75 MG tablet Take 1 tablet by mouth once daily with breakfast  . [DISCONTINUED] ezetimibe (ZETIA) 10 MG tablet Take 1 tablet (10 mg total) by mouth daily.  . [DISCONTINUED] lisinopril (ZESTRIL) 20 MG tablet TAKE 1 TABLET(20 MG) BY MOUTH DAILY  . [DISCONTINUED] metoprolol tartrate (LOPRESSOR) 50 MG tablet Take 1 tablet (50 mg total) by mouth 2 (two) times  daily.  . [DISCONTINUED] nitroGLYCERIN (NITROSTAT) 0.4 MG SL tablet Place 1 tablet (0.4 mg total) under the tongue every 5 (five) minutes x 3 doses as needed for chest pain.     Allergies:   Patient has no known allergies.   Social History   Socioeconomic History  . Marital status: Married    Spouse name: Not on file  . Number of children: Not on file  . Years of education: Not on file  . Highest education level: Not on file  Occupational History  . Not on file  Tobacco Use  . Smoking status: Former Smoker    Packs/day: 0.20    Years: 15.00    Pack years: 3.00    Types: Cigarettes  . Smokeless tobacco: Never Used  Vaping Use  . Vaping Use: Never used  Substance and Sexual Activity  . Alcohol use: Yes    Comment: 40oz beer  . Drug use: No  . Sexual activity: Yes    Birth control/protection: None  Other Topics Concern  . Not on file  Social History Narrative  . Not on file   Social Determinants of Health   Financial Resource Strain: Not on file  Food Insecurity: Not on file  Transportation Needs: Not on file  Physical Activity: Not on file  Stress: Not on file  Social Connections: Not on file     Family History: The patient's family history includes Breast cancer in her maternal grandmother; Heart disease in her mother; Hyperlipidemia in her mother and sister; Hypertension in her mother and sister.  ROS:   Please see the history of present illness.     EKGs/Labs/Other Studies Reviewed:    The following studies were reviewed today: None  EKG:  EKG is  ordered today.  The ekg ordered today demonstrates NSR, no ectopy, HR 75, QRS 52ms, QTc .    Recent Labs: 03/13/2020: ALT 15; BUN 19; Creatinine, Ser 0.76; Hemoglobin 12.0; Platelets 421.0; Potassium 4.3; Sodium 138; TSH 4.23  Recent Lipid Panel    Component Value Date/Time   CHOL 168 03/13/2020 1356   CHOL 161 11/30/2018 0923   TRIG 119.0 03/13/2020 1356   HDL 75.40 03/13/2020 1356   HDL 72 11/30/2018  0923   CHOLHDL 2 03/13/2020 1356   VLDL 23.8 03/13/2020 1356   LDLCALC 69 03/13/2020 1356   LDLCALC 70 11/30/2018 0923     Risk Assessment/Calculations:       Physical Exam:    VS:  BP (!) 148/88   Pulse 75   Ht 5\' 3"  (1.6 m)   Wt 186 lb 12.8 oz (84.7 kg)   SpO2 95%   BMI 33.09 kg/m     Wt Readings from Last 3 Encounters:  04/18/20 186 lb 12.8 oz (84.7 kg)  03/13/20 185 lb 3.2 oz (84 kg)  06/14/19 189 lb 6.4 oz (85.9 kg)     GEN: Well nourished, well developed  in no acute distress HEENT: Normal NECK: No JVD; No carotid bruits LYMPHATICS: No lymphadenopathy CARDIAC: RRR, no murmurs, rubs, gallops RESPIRATORY:  Clear to auscultation without rales, wheezing or rhonchi  ABDOMEN: Soft, non-tender, non-distended MUSCULOSKELETAL:  No edema; No deformity  SKIN: Warm and dry NEUROLOGIC:  Alert and oriented x 3 PSYCHIATRIC:  Normal affect   ASSESSMENT AND PLAN   1. Essential hypertension, well controlled by home readings.   - Goal <130/80.  She has plans to improve diet/exercise.   - Continue norvasc and lisinopril at current doses.  If remains elevated, we do have room to titrate these meds.    2. Coronary artery disease involving native coronary artery of native heart without angina pectoris, stable.   - No anginal equivalent over past year (heartburn). - EF 45-50%, basal-mid inferior/posterior HK - no CHF symptoms.  On ACEI/BB.   - Continue statin, ASA/Plavix.    3. Hyperlipidemia, unspecified hyperlipidemia type, controlled. - LDL <70, at goal. - Continue high dose lipitor & zetia.       4. Morbid obesity (HCC)/ Diabetes Mellitus Type II without complication - Extensively discussed diet/exercise. - Pt asks about Golo supplement.  I have messaged our pharmacist to ensure this is safe for her.  --> Spoke with our pharmacist who recommends Ozempic, a GLP-1 receptor agonist, which can help with weight loss, DM and CV protection.  Margaretmary Dys will call the patient to  discuss/ look at insurance coverage.    We also discussed that pt is a candidate for the second COVID booster which she plans to get.      Medication Adjustments/Labs and Tests Ordered: Current medicines are reviewed at length with the patient today.  Concerns regarding medicines are outlined above.  Orders Placed This Encounter  Procedures  . EKG 12-Lead   Meds ordered this encounter  Medications  . amLODipine (NORVASC) 5 MG tablet    Sig: TAKE 1 TABLET(5 MG) BY MOUTH DAILY    Dispense:  90 tablet    Refill:  3  . atorvastatin (LIPITOR) 80 MG tablet    Sig: Take 1 tablet (80 mg total) by mouth daily at 6 PM.    Dispense:  90 tablet    Refill:  3  . clopidogrel (PLAVIX) 75 MG tablet    Sig: Take 1 tablet by mouth once daily with breakfast    Dispense:  90 tablet    Refill:  3  . ezetimibe (ZETIA) 10 MG tablet    Sig: Take 1 tablet (10 mg total) by mouth daily.    Dispense:  90 tablet    Refill:  3  . lisinopril (ZESTRIL) 20 MG tablet    Sig: TAKE 1 TABLET(20 MG) BY MOUTH DAILY    Dispense:  90 tablet    Refill:  3  . metoprolol tartrate (LOPRESSOR) 50 MG tablet    Sig: Take 1 tablet (50 mg total) by mouth 2 (two) times daily.    Dispense:  180 tablet    Refill:  3  . nitroGLYCERIN (NITROSTAT) 0.4 MG SL tablet    Sig: Place 1 tablet (0.4 mg total) under the tongue every 5 (five) minutes x 3 doses as needed for chest pain.    Dispense:  25 tablet    Refill:  3    Patient Instructions  Medication Instructions:  Your physician recommends that you continue on your current medications as directed. Please refer to the Current Medication list given to you today.  *If you  need a refill on your cardiac medications before your next appointment, please call your pharmacy*   Lab Work: None ordered  If you have labs (blood work) drawn today and your tests are completely normal, you will receive your results only by: Marland Kitchen. MyChart Message (if you have MyChart) OR . A paper copy  in the mail If you have any lab test that is abnormal or we need to change your treatment, we will call you to review the results.   Testing/Procedures: None ordered   Follow-Up: At [facility], you and your health needs are our priority.  As part of our continuing mission to provide you with exceptional heart care, we have created designated Provider Care Teams.  These Care Teams include your primary Cardiologist (physician) and Advanced Practice Providers (APPs -  Physician Assistants and Nurse Practitioners) who all work together to provide you with the care you need, when you need it.  We recommend signing up for the patient portal called "MyChart".  Sign up information is provided on this After Visit Summary.  MyChart is used to connect with patients for Virtual Visits (Telemedicine).  Patients are able to view lab/test results, encounter notes, upcoming appointments, etc.  Non-urgent messages can be sent to your provider as well.   To learn more about what you can do with MyChart, go to [email or web address].    Your next appointment:   12 month(s)  The format for your next appointment:   In Person  Provider:   You may see Verne Carrowhristopher McAlhany, MD or one of the following Advanced Practice Providers on your designated Care Team:    Ronie Spiesayna Dunn, PA-C  Jacolyn ReedyMichele Lenze, PA-C    Other Instructions      Signed, Bernette MayersJennifer K Jozey Janco, PA-C  04/18/2020 9:17 AM    [facility]

## 2020-04-22 ENCOUNTER — Ambulatory Visit: Payer: Medicare Other

## 2020-05-07 ENCOUNTER — Ambulatory Visit (INDEPENDENT_AMBULATORY_CARE_PROVIDER_SITE_OTHER): Payer: Medicare Other

## 2020-05-07 ENCOUNTER — Other Ambulatory Visit: Payer: Self-pay

## 2020-05-07 VITALS — BP 134/78 | HR 81 | Temp 97.2°F | Resp 16 | Ht 62.5 in | Wt 187.4 lb

## 2020-05-07 DIAGNOSIS — Z0000 Encounter for general adult medical examination without abnormal findings: Secondary | ICD-10-CM | POA: Diagnosis not present

## 2020-05-07 DIAGNOSIS — Z1231 Encounter for screening mammogram for malignant neoplasm of breast: Secondary | ICD-10-CM | POA: Diagnosis not present

## 2020-05-07 NOTE — Progress Notes (Signed)
Subjective:   Diamond Hughes is a 70 y.o. female who presents for Medicare Annual (Subsequent) preventive examination.  Review of Systems     Cardiac Risk Factors include: advanced age (>66men, >110 women);diabetes mellitus;hypertension;dyslipidemia;obesity (BMI >30kg/m2)     Objective:    Today's Vitals   05/07/20 0851  BP: 134/78  Pulse: 81  Resp: 16  Temp: (!) 97.2 F (36.2 C)  TempSrc: Temporal  SpO2: 98%  Weight: 187 lb 6.4 oz (85 kg)  Height: 5' 2.5" (1.588 m)   Body mass index is 33.73 kg/m.  Advanced Directives 05/07/2020 01/25/2019 02/13/2016 02/08/2016 02/02/2016  Does Patient Have a Medical Advance Directive? No No No No No  Would patient like information on creating a medical advance directive? No - Patient declined No - Patient declined No - Patient declined No - Patient declined -    Current Medications (verified) Outpatient Encounter Medications as of 05/07/2020  Medication Sig  . amLODipine (NORVASC) 5 MG tablet TAKE 1 TABLET(5 MG) BY MOUTH DAILY  . aspirin 81 MG tablet Take 1 tablet (81 mg total) by mouth daily.  Marland Kitchen atorvastatin (LIPITOR) 80 MG tablet Take 1 tablet (80 mg total) by mouth daily at 6 PM.  . clopidogrel (PLAVIX) 75 MG tablet Take 1 tablet by mouth once daily with breakfast  . clotrimazole-betamethasone (LOTRISONE) cream Apply 1 application topically 2 (two) times daily.  Marland Kitchen ezetimibe (ZETIA) 10 MG tablet Take 1 tablet (10 mg total) by mouth daily.  Marland Kitchen levothyroxine (SYNTHROID) 75 MCG tablet Take 1 tablet (75 mcg total) by mouth daily before breakfast.  . lisinopril (ZESTRIL) 20 MG tablet TAKE 1 TABLET(20 MG) BY MOUTH DAILY  . metFORMIN (GLUCOPHAGE) 500 MG tablet Take 0.5 tablets (250 mg total) by mouth daily with breakfast.  . metoprolol tartrate (LOPRESSOR) 50 MG tablet Take 1 tablet (50 mg total) by mouth 2 (two) times daily.  . nitroGLYCERIN (NITROSTAT) 0.4 MG SL tablet Place 1 tablet (0.4 mg total) under the tongue every 5 (five) minutes x 3 doses  as needed for chest pain.   No facility-administered encounter medications on file as of 05/07/2020.    Allergies (verified) Patient has no known allergies.   History: Past Medical History:  Diagnosis Date  . CAD (coronary artery disease)    a. 2000 s/p MI and RCA PCI [facility]);  b. 02/2016 NSTEMI/PCI: LM nl, LAD nl, LCX 100p (3.5x18 Resolute Onyx DES), OM1/3 small, RCA 70p aneurysmal, 75m/d ISR-->attempted PCI, could not cross w/ wire-->med rx, EF 45-50%.  . Hyperlipidemia   . Hypertension   . Ischemic cardiomyopathy    a. 02/2016 Echo: EF 45-50%, basal-mid inferior/posterior HK, Gr1 DD, triv MR, nl LA size, nl RV fxn.  . Normocytic anemia    Past Surgical History:  Procedure Laterality Date  . ANGIOPLASTY  2000  . CORONARY STENT INTERVENTION N/A 02/10/2016   Procedure: Coronary Stent Intervention;  Surgeon: Lyn Records, MD;  Location: [facility] INVASIVE CV LAB;  Service: Cardiovascular;  Laterality: N/A;  . LEFT HEART CATH AND CORONARY ANGIOGRAPHY N/A 02/10/2016   Procedure: Left Heart Cath and Coronary Angiography;  Surgeon: Lyn Records, MD;  Location: [facility] INVASIVE CV LAB;  Service: Cardiovascular;  Laterality: N/A;   Family History  Problem Relation Age of Onset  . Heart disease Mother   . Hyperlipidemia Mother   . Hypertension Mother   . Hyperlipidemia Sister   . Hypertension Sister   . Breast cancer Maternal Grandmother    Social History  Socioeconomic History  . Marital status: Married    Spouse name: Not on file  . Number of children: Not on file  . Years of education: Not on file  . Highest education level: Not on file  Occupational History  . Occupation: retired  Tobacco Use  . Smoking status: Former Smoker    Packs/day: 0.20    Years: 15.00    Pack years: 3.00    Types: Cigarettes  . Smokeless tobacco: Never Used  Vaping Use  . Vaping Use: Never used  Substance and Sexual Activity  . Alcohol use: Yes    Comment: 40oz beer  . Drug use: No  . Sexual  activity: Yes    Birth control/protection: None  Other Topics Concern  . Not on file  Social History Narrative  . Not on file   Social Determinants of Health   Financial Resource Strain: Low Risk   . Difficulty of Paying Living Expenses: Not hard at all  Food Insecurity: No Food Insecurity  . Worried About Programme researcher, broadcasting/film/videounning Out of Food in the Last Year: Never true  . Ran Out of Food in the Last Year: Never true  Transportation Needs: No Transportation Needs  . Lack of Transportation (Medical): No  . Lack of Transportation (Non-Medical): No  Physical Activity: Insufficiently Active  . Days of Exercise per Week: 2 days  . Minutes of Exercise per Session: 30 min  Stress: No Stress Concern Present  . Feeling of Stress : Not at all  Social Connections: Moderately Integrated  . Frequency of Communication with Friends and Family: More than three times a week  . Frequency of Social Gatherings with Friends and Family: Once a week  . Attends Religious Services: 1 to 4 times per year  . Active Member of Clubs or Organizations: No  . Attends BankerClub or Organization Meetings: Never  . Marital Status: Married    Tobacco Counseling Counseling given: Not Answered   Clinical Intake:  Pre-visit preparation completed: Yes  Pain : No/denies pain     Nutritional Status: BMI > 30  Obese Nutritional Risks: None Diabetes: Yes CBG done?: No Did pt. bring in CBG monitor from home?: No  How often do you need to have someone help you when you read instructions, pamphlets, or other written materials from your doctor or pharmacy?: 1 - Never  Diabetes:  Is the patient diabetic?  Yes  If diabetic, was a CBG obtained today?  No  Did the patient bring in their glucometer from home?  No  How often do you monitor your CBG's? never.   Financial Strains and Diabetes Management:  Are you having any financial strains with the device, your supplies or your medication? No .  Does the patient want to be seen by  Chronic Care Management for management of their diabetes?  No  Would the patient like to be referred to a Nutritionist or for Diabetic Management?  No   Diabetic Exams:  Diabetic Eye Exam: . Overdue for diabetic eye exam. Pt has been advised about the importance in completing this exam.   Diabetic Foot Exam:  Pt has been advised about the importance in completing this exam. To be completed by PCP.     Interpreter Needed?: No  Information entered by :: Thomasenia SalesMartha Valisha Heslin LPN   Activities of Daily Living In your present state of health, do you have any difficulty performing the following activities: 05/07/2020  Hearing? N  Vision? N  Difficulty concentrating or making decisions? N  Walking or climbing stairs? N  Dressing or bathing? N  Doing errands, shopping? N  Preparing Food and eating ? N  Using the Toilet? N  In the past six months, have you accidently leaked urine? Y  Comment occasionally  Do you have problems with loss of bowel control? N  Managing your Medications? N  Managing your Finances? N  Housekeeping or managing your Housekeeping? N  Some recent data might be hidden    Patient Care Team: Nche, Bonna Gains, NP as PCP - General (Internal Medicine) Kathleene Hazel, MD as PCP - Cardiology (Cardiology)  Indicate any recent Medical Services you may have received from other than [facility] providers in the past year (date may be approximate).     Assessment:   This is a routine wellness examination for Mercadies.  Hearing/Vision screen  Hearing Screening   125Hz  250Hz  500Hz  1000Hz  2000Hz  3000Hz  4000Hz  6000Hz  8000Hz   Right ear:           Left ear:           Comments: No issues  Vision Screening Comments: Wears glasses Last eye exam-01/2020-Dr. Poudyal  Dietary issues and exercise activities discussed: Current Exercise Habits: Home exercise routine, Type of exercise: walking (gym), Time (Minutes): 30, Frequency (Times/Week): 2, Weekly Exercise (Minutes/Week):  60, Intensity: Mild, Exercise limited by: None identified  Goals Addressed            This Visit's Progress   . Patient Stated       Lose some weight & start going to the gym      Depression Screen PHQ 2/9 Scores 05/07/2020 03/13/2020 06/14/2019 01/25/2019 03/18/2018 08/09/2017 02/13/2016  PHQ - 2 Score 1 0 0 0 0 0 0    Fall Risk Fall Risk  05/07/2020 06/14/2019 01/25/2019 03/18/2018 08/09/2017  Falls in the past year? 0 0 0 0 No  Number falls in past yr: 0 0 0 - -  Injury with Fall? 0 0 0 - -  Follow up Falls prevention discussed - Education provided;Falls prevention discussed - -    FALL RISK PREVENTION PERTAINING TO THE HOME:  Any stairs in or around the home? Yes  If so, are there any without handrails? No  Home free of loose throw rugs in walkways, pet beds, electrical cords, etc? Yes  Adequate lighting in your home to reduce risk of falls? Yes   ASSISTIVE DEVICES UTILIZED TO PREVENT FALLS:  Life alert? No  Use of a cane, walker or w/c? No  Grab bars in the bathroom? No  Shower chair or bench in shower? No  Elevated toilet seat or a handicapped toilet? No   TIMED UP AND GO:  Was the test performed? Yes .  Length of time to ambulate 10 feet: 9 sec.   Gait steady and fast without use of assistive device  Cognitive Function:Normal cognitive status assessed by direct observation by this Nurse Health Advisor. No abnormalities found.          Immunizations Immunization History  Administered Date(s) Administered  . PFIZER(Purple Top)SARS-COV-2 Vaccination 01/23/2019, 03/04/2019, 12/04/2019    TDAP status: Due, Education has been provided regarding the importance of this vaccine. Advised may receive this vaccine at local pharmacy or Health Dept. Aware to provide a copy of the vaccination record if obtained from local pharmacy or Health Dept. Verbalized acceptance and understanding.  Flu Vaccine status: Due, Education has been provided regarding the importance of this vaccine.  Advised may receive this vaccine at local  pharmacy or Health Dept. Aware to provide a copy of the vaccination record if obtained from local pharmacy or Health Dept. Verbalized acceptance and understanding.  Pneumococcal vaccine status: Due, Education has been provided regarding the importance of this vaccine. Advised may receive this vaccine at local pharmacy or Health Dept. Aware to provide a copy of the vaccination record if obtained from local pharmacy or Health Dept. Verbalized acceptance and understanding.  Covid-19 vaccine status: Completed vaccines  Qualifies for Shingles Vaccine? Yes   Zostavax completed No   Shingrix Completed?: No.    Education has been provided regarding the importance of this vaccine. Patient has been advised to call insurance company to determine out of pocket expense if they have not yet received this vaccine. Advised may also receive vaccine at local pharmacy or Health Dept. Verbalized acceptance and understanding.  Screening Tests Health Maintenance  Topic Date Due  . FOOT EXAM  Never done  . OPHTHALMOLOGY EXAM  Never done  . MAMMOGRAM  11/20/2019  . TETANUS/TDAP  06/13/2020 (Originally 05/03/1969)  . PNA vac Low Risk Adult (1 of 2 - PCV13) 06/13/2020 (Originally 05/02/2017)  . INFLUENZA VACCINE  08/05/2020  . HEMOGLOBIN A1C  09/13/2020  . Fecal DNA (Cologuard)  11/05/2020  . DEXA SCAN  Completed  . COVID-19 Vaccine  Completed  . Hepatitis C Screening  Completed  . HPV VACCINES  Aged Out    Health Maintenance  Health Maintenance Due  Topic Date Due  . FOOT EXAM  Never done  . OPHTHALMOLOGY EXAM  Never done  . MAMMOGRAM  11/20/2019    Colorectal cancer screening: Type of screening: Cologuard. Completed 11/05/2017. Repeat every 3 years  Mammogram status: Ordered today. Pt provided with contact info and advised to call to schedule appt.   Bone Density status: Completed 07/18/2019. Results reflect: Bone density results: OSTEOPENIA. Repeat every 2  years.  Lung Cancer Screening: (Low Dose CT Chest recommended if Age 22-80 years, 30 pack-year currently smoking OR have quit w/in 15years.) does not qualify.     Additional Screening:  Hepatitis C Screening:Completed 08/09/2017  Vision Screening: Recommended annual ophthalmology exams for early detection of glaucoma and other disorders of the eye. Is the patient up to date with their annual eye exam?  Yes  Who is the provider or what is the name of the office in which the patient attends annual eye exams? Dr. Massie Kluver   Dental Screening: Recommended annual dental exams for proper oral hygiene  Community Resource Referral / Chronic Care Management: CRR required this visit?  No   CCM required this visit?  No      Plan:     I have personally reviewed and noted the following in the patient's chart:   . Medical and social history . Use of alcohol, tobacco or illicit drugs  . Current medications and supplements including opioid prescriptions.  . Functional ability and status . Nutritional status . Physical activity . Advanced directives . List of other physicians . Hospitalizations, surgeries, and ER visits in previous 12 months . Vitals . Screenings to include cognitive, depression, and falls . Referrals and appointments  In addition, I have reviewed and discussed with patient certain preventive protocols, quality metrics, and best practice recommendations. A written personalized care plan for preventive services as well as general preventive health recommendations were provided to patient.     Roanna Raider, LPN   [id number]/0/9811  Nurse health Advisor  Nurse Notes: None

## 2020-05-07 NOTE — Patient Instructions (Signed)
[name] , Thank you for taking time to come for your Medicare Wellness Visit. I appreciate your ongoing commitment to your health goals. Please review the following plan we discussed and let me know if I can assist you in the future.   Screening recommendations/referrals: Colonoscopy: Completed Cologuard 11/05/2017-Due 11/05/2020 Mammogram: Ordered today. Some one will be calling you to schedule. Bone Density: Completed 07/18/2019-Due 07/17/2021 Recommended yearly ophthalmology/optometry visit for glaucoma screening and checkup Recommended yearly dental visit for hygiene and checkup  Vaccinations: Influenza vaccine: Due-09/2020 Pneumococcal vaccine: Due-May obtain vaccine at our office or your local pharmacy. Tdap vaccine: Discuss with pharmacy Shingles vaccine: Discuss with pharmacy  Covid-19:Up to date  Advanced directives: Declined information today  Conditions/risks identified: See problem list  Next appointment: Follow up in one year for your annual wellness visit    Preventive Care 65 Years and Older, Female Preventive care refers to lifestyle choices and visits with your health care provider that can promote health and wellness. What does preventive care include?  A yearly physical exam. This is also called an annual well check.  Dental exams once or twice a year.  Routine eye exams. Ask your health care provider how often you should have your eyes checked.  Personal lifestyle choices, including:  Daily care of your teeth and gums.  Regular physical activity.  Eating a healthy diet.  Avoiding tobacco and drug use.  Limiting alcohol use.  Practicing safe sex.  Taking low-dose aspirin every day.  Taking vitamin and mineral supplements as recommended by your health care provider. What happens during an annual well check? The services and screenings done by your health care provider during your annual well check will depend on your age, overall health, lifestyle  risk factors, and family history of disease. Counseling  Your health care provider may ask you questions about your:  Alcohol use.  Tobacco use.  Drug use.  Emotional well-being.  Home and relationship well-being.  Sexual activity.  Eating habits.  History of falls.  Memory and ability to understand (cognition).  Work and work Astronomer.  Reproductive health. Screening  You may have the following tests or measurements:  Height, weight, and BMI.  Blood pressure.  Lipid and cholesterol levels. These may be checked every 5 years, or more frequently if you are over 73 years old.  Skin check.  Lung cancer screening. You may have this screening every year starting at age 84 if you have a 30-pack-year history of smoking and currently smoke or have quit within the past 15 years.  Fecal occult blood test (FOBT) of the stool. You may have this test every year starting at age 71.  Flexible sigmoidoscopy or colonoscopy. You may have a sigmoidoscopy every 5 years or a colonoscopy every 10 years starting at age 32.  Hepatitis C blood test.  Hepatitis B blood test.  Sexually transmitted disease (STD) testing.  Diabetes screening. This is done by checking your blood sugar (glucose) after you have not eaten for a while (fasting). You may have this done every 1-3 years.  Bone density scan. This is done to screen for osteoporosis. You may have this done starting at age 30.  Mammogram. This may be done every 1-2 years. Talk to your health care provider about how often you should have regular mammograms. Talk with your health care provider about your test results, treatment options, and if necessary, the need for more tests. Vaccines  Your health care provider may recommend certain vaccines, such as:  Influenza vaccine. This is recommended every year.  Tetanus, diphtheria, and acellular pertussis (Tdap, Td) vaccine. You may need a Td booster every 10 years.  Zoster vaccine.  You may need this after age 33.  Pneumococcal 13-valent conjugate (PCV13) vaccine. One dose is recommended after age 15.  Pneumococcal polysaccharide (PPSV23) vaccine. One dose is recommended after age 29. Talk to your health care provider about which screenings and vaccines you need and how often you need them. This information is not intended to replace advice given to you by your health care provider. Make sure you discuss any questions you have with your health care provider. Document Released: 01/18/2015 Document Revised: 09/11/2015 Document Reviewed: 10/23/2014 Elsevier Interactive Patient Education  2017 [employer] Prevention in the Home Falls can cause injuries. They can happen to people of all ages. There are many things you can do to make your home safe and to help prevent falls. What can I do on the outside of my home?  Regularly fix the edges of walkways and driveways and fix any cracks.  Remove anything that might make you trip as you walk through a door, such as a raised step or threshold.  Trim any bushes or trees on the path to your home.  Use bright outdoor lighting.  Clear any walking paths of anything that might make someone trip, such as rocks or tools.  Regularly check to see if handrails are loose or broken. Make sure that both sides of any steps have handrails.  Any raised decks and porches should have guardrails on the edges.  Have any leaves, snow, or ice cleared regularly.  Use sand or salt on walking paths during winter.  Clean up any spills in your garage right away. This includes oil or grease spills. What can I do in the bathroom?  Use night lights.  Install grab bars by the toilet and in the tub and shower. Do not use towel bars as grab bars.  Use non-skid mats or decals in the tub or shower.  If you need to sit down in the shower, use a plastic, non-slip stool.  Keep the floor dry. Clean up any water that spills on the floor as soon  as it happens.  Remove soap buildup in the tub or shower regularly.  Attach bath mats securely with double-sided non-slip rug tape.  Do not have throw rugs and other things on the floor that can make you trip. What can I do in the bedroom?  Use night lights.  Make sure that you have a light by your bed that is easy to reach.  Do not use any sheets or blankets that are too big for your bed. They should not hang down onto the floor.  Have a firm chair that has side arms. You can use this for support while you get dressed.  Do not have throw rugs and other things on the floor that can make you trip. What can I do in the kitchen?  Clean up any spills right away.  Avoid walking on wet floors.  Keep items that you use a lot in easy-to-reach places.  If you need to reach something above you, use a strong step stool that has a grab bar.  Keep electrical cords out of the way.  Do not use floor polish or wax that makes floors slippery. If you must use wax, use non-skid floor wax.  Do not have throw rugs and other things on the floor that  can make you trip. What can I do with my stairs?  Do not leave any items on the stairs.  Make sure that there are handrails on both sides of the stairs and use them. Fix handrails that are broken or loose. Make sure that handrails are as long as the stairways.  Check any carpeting to make sure that it is firmly attached to the stairs. Fix any carpet that is loose or worn.  Avoid having throw rugs at the top or bottom of the stairs. If you do have throw rugs, attach them to the floor with carpet tape.  Make sure that you have a light switch at the top of the stairs and the bottom of the stairs. If you do not have them, ask someone to add them for you. What else can I do to help prevent falls?  Wear shoes that:  Do not have high heels.  Have rubber bottoms.  Are comfortable and fit you well.  Are closed at the toe. Do not wear sandals.  If  you use a stepladder:  Make sure that it is fully opened. Do not climb a closed stepladder.  Make sure that both sides of the stepladder are locked into place.  Ask someone to hold it for you, if possible.  Clearly mark and make sure that you can see:  Any grab bars or handrails.  First and last steps.  Where the edge of each step is.  Use tools that help you move around (mobility aids) if they are needed. These include:  Canes.  Walkers.  Scooters.  Crutches.  Turn on the lights when you go into a dark area. Replace any light bulbs as soon as they burn out.  Set up your furniture so you have a clear path. Avoid moving your furniture around.  If any of your floors are uneven, fix them.  If there are any pets around you, be aware of where they are.  Review your medicines with your doctor. Some medicines can make you feel dizzy. This can increase your chance of falling. Ask your doctor what other things that you can do to help prevent falls. This information is not intended to replace advice given to you by your health care provider. Make sure you discuss any questions you have with your health care provider. Document Released: 10/18/2008 Document Revised: 05/30/2015 Document Reviewed: 01/26/2014 Elsevier Interactive Patient Education  2017 [employer].

## 2020-05-08 ENCOUNTER — Telehealth: Payer: Self-pay | Admitting: Nurse Practitioner

## 2020-05-08 NOTE — Telephone Encounter (Signed)
Pt wanted to know if there was anything else she could take besides metformin

## 2020-05-10 NOTE — Telephone Encounter (Signed)
Patient notified and verbalized understanding. 

## 2020-05-10 NOTE — Telephone Encounter (Signed)
Metformin is recommended to provide cardiovascular protection. Medications like glyburide and actos do not give that. Jardiance or farxiga can also give those cardiovascular benefits, but I do not recommend that at this time due to possible side effects.

## 2020-05-13 ENCOUNTER — Ambulatory Visit (INDEPENDENT_AMBULATORY_CARE_PROVIDER_SITE_OTHER): Payer: Medicare Other | Admitting: Pharmacist

## 2020-05-13 ENCOUNTER — Other Ambulatory Visit: Payer: Self-pay

## 2020-05-13 DIAGNOSIS — Z6833 Body mass index (BMI) 33.0-33.9, adult: Secondary | ICD-10-CM | POA: Diagnosis not present

## 2020-05-13 DIAGNOSIS — E669 Obesity, unspecified: Secondary | ICD-10-CM | POA: Insufficient documentation

## 2020-05-13 DIAGNOSIS — E663 Overweight: Secondary | ICD-10-CM

## 2020-05-13 NOTE — Progress Notes (Signed)
Patient ID: CRISOL MUECKE                 DOB: 04/15/1950                    MRN: [id number]     HPI: Diamond Hughes is a 70 y.o. female patient of Dr Angelena Form referred to pharmacy clinic by Clifton Custard, PA to initiate weight loss therapy with GLP1-RA. PMH is significant for obesity complicated by chronic medical conditions including HTN, HLD, CAD s/p MI treated with stent to RCA in 2000, NSTEMI 02/2016 with severe in-stent restenosis of RCA and 50-70% prox RCA with large saccular aneurysm and 99% circumflex. She had DES to the circumflex and failed angioplasty to the RCA in-stent restenosis and distal de novo disease due to inability to cross the balloon. Medical therapy recommended for the RCA lesion. 2D echo 2/2019showed LVEF 45 to 50% with basal to mid inferior posterior hypokinesis and grade 1 DD. Frequent PVCs on prior EKGs and was advised to reduce caffeine and alcohol consumption. A1c 6.6% in March 2022 and pt was started on metformin. Seen by Clifton Custard 04/18/20 and inquired about starting supplement called Golo to help with weight loss. Current BMI ~34.  I called pt and discussed that OTC supplementation is not regulated the same way other prescription meds are, and that efficacy/safety data is not well know. Discussed starting GLP1-RA like Ozempic instead which shows great weight loss and also would help her with recently diagnosed DM along with added benefit of CV risk reduction given her extensive CAD history. Pt was agreeable and presents today for injection training.  Pt presents today in good spirits. Taking very low dose metformin 255m daily. Ate more salt today - she thinks BP is up because of this. Used to weigh 140-160 lbs, for the past few years has been up 20 lbs from this. Attributes extra weight gain to snacking at home more during [place]   Current weight management medications: none  Previously tried meds: none  Current meds that may affect weight: none  Baseline  weight/BMI: 187 lb /33.7   [place]mo or $131/3 mo  Diet:  -Breakfast: Activia yogurt, a few slices of bacon (not regularly), OJ -Lunch: [employer] -Dinner: fish and chicken, limits red meat and pasta. Sometimes will have Slim Fast -Snacks: tKuwaitor ham deli meat -Drinks: a few beer, milk, sweet tea (2 cups per day)  Exercise: Nordic track bike at home. Joined [employer]. Lives on the 3rd floor and walks a bit.  Family History: Mother with heart disease, HTN, and HLD. Sister with HTN and HLD. Maternal grandmother with breast cancer.  Social History: Former smoker, drinks a few beers regularly, denies drug use.  Labs: Lab Results  Component Value Date   HGBA1C 6.6 (H) 03/13/2020    Wt Readings from Last 1 Encounters:  05/07/20 187 lb 6.4 oz (85 kg)    BP Readings from Last 1 Encounters:  05/07/20 134/78   Pulse Readings from Last 1 Encounters:  05/07/20 81       Component Value Date/Time   CHOL 168 03/13/2020 1356   CHOL 161 11/30/2018 0923   TRIG 119.0 03/13/2020 1356   HDL 75.40 03/13/2020 1356   HDL 72 11/30/2018 0923   CHOLHDL 2 03/13/2020 1356   VLDL 23.8 03/13/2020 1356   LDLCALC 69 03/13/2020 1356   LDLCALC 70 11/30/2018 0923    Past Medical  History:  Diagnosis Date  . CAD (coronary artery disease)    a. 2000 s/p MI and RCA PCI [facility]);  b. 02/2016 NSTEMI/PCI: LM nl, LAD nl, LCX 100p (3.5x18 Resolute Onyx DES), OM1/3 small, RCA 70p aneurysmal, 37md ISR-->attempted PCI, could not cross w/ wire-->med rx, EF 45-50%.  . Hyperlipidemia   . Hypertension   . Ischemic cardiomyopathy    a. 02/2016 Echo: EF 45-50%, basal-mid inferior/posterior HK, Gr1 DD, triv MR, nl LA size, nl RV fxn.  . Normocytic anemia     Current Outpatient Medications on File Prior to Visit  Medication Sig Dispense Refill  . amLODipine (NORVASC) 5 MG tablet TAKE 1 TABLET(5 MG) BY MOUTH DAILY 90 tablet 3  . aspirin 81 MG tablet Take 1 tablet  (81 mg total) by mouth daily. 30 tablet 6  . atorvastatin (LIPITOR) 80 MG tablet Take 1 tablet (80 mg total) by mouth daily at 6 PM. 90 tablet 3  . clopidogrel (PLAVIX) 75 MG tablet Take 1 tablet by mouth once daily with breakfast 90 tablet 3  . clotrimazole-betamethasone (LOTRISONE) cream Apply 1 application topically 2 (two) times daily. 30 g 0  . ezetimibe (ZETIA) 10 MG tablet Take 1 tablet (10 mg total) by mouth daily. 90 tablet 3  . levothyroxine (SYNTHROID) 75 MCG tablet Take 1 tablet (75 mcg total) by mouth daily before breakfast. 90 tablet 1  . lisinopril (ZESTRIL) 20 MG tablet TAKE 1 TABLET(20 MG) BY MOUTH DAILY 90 tablet 3  . metFORMIN (GLUCOPHAGE) 500 MG tablet Take 0.5 tablets (250 mg total) by mouth daily with breakfast. 45 tablet 1  . metoprolol tartrate (LOPRESSOR) 50 MG tablet Take 1 tablet (50 mg total) by mouth 2 (two) times daily. 180 tablet 3  . nitroGLYCERIN (NITROSTAT) 0.4 MG SL tablet Place 1 tablet (0.4 mg total) under the tongue every 5 (five) minutes x 3 doses as needed for chest pain. 25 tablet 3   No current facility-administered medications on file prior to visit.    No Known Allergies   Assessment/Plan:  1. Weight loss - Patient has not met goal of at least 5% of body weight loss with comprehensive lifestyle modifications alone in the past 3-6 months. Pharmacotherapy is appropriate to pursue as augmentation. Will start Ozempic 0.219mweekly. Discussed added benefit of A1c lowering (recently diagnosed with T2DM) and CV risk reduction data (pt has hx of 2 MIs).   Confirmed patient not pregnant and no personal or family history of medullary thyroid carcinoma (MTC) or Multiple Endocrine Neoplasia syndrome type 2 (MEN 2). Advised patient on common side effects including nausea, diarrhea, dyspepsia, decreased appetite, and fatigue. Counseled patient on reducing meal size and how to titrate medication to minimize side effects. She will focus specifically on changing to  drinking unsweet tea. Counseled patient to call if intolerable side effects or if experiencing dehydration, abdominal pain, or dizziness. Patient will adhere to dietary modifications and will target at least 150 minutes of moderate intensity exercise weekly.   Follow up by phone in 1 month to ensure tolerability before next dose titration.  Diamond Hughes E. Grazia Taffe, PharmD, BCACP, CPLake [id number]. [id number] [id number]: [phone]: [phone]/09/2020 4:10 PM

## 2020-05-13 NOTE — Patient Instructions (Addendum)
Ozempic Counseling Points 1. This medication reduces your appetite and may make you feel fuller longer.  2. Stop eating when your body tells you that you are full. This will likely happen sooner than you are used to. 3. Store your medication in the fridge until you are ready to use it. 4. Inject your medication in the fatty tissue of your lower abdominal area (2 inches away from belly button) or upper outer thigh. 5. Common side effects include: nausea, diarrhea/constipation, and heartburn, and are more likely to occur if you overeat. 6. Inject Ozempic 0.25mg  once weekly for 4 weeks. Then, increase your dose to 0.5mg  once weekly for 4 weeks. After that, I'll send a prescription to your pharmacy for the 1mg  weekly dose which will be $47/month with your insurance   Tips for living a healthier life  SUGAR  Look for unsweet tea. You can sweeten it with , Stevia, or Splenda  Sugar is a huge problem in the modern day diet. Sugar is a big contributor to heart disease, diabetes, high triglyceride levels, fatty liver disease and obesity. Sugar is hidden in almost all packaged foods/beverages. Added sugar is extra sugar that is added beyond what is naturally found and has no nutritional benefit for your body. The American Heart Association recommends limiting added sugars to no more than 25g for women and 36 grams for men per day. There are many names for sugar including maltose, sucrose (names ending in "ose"), high fructose corn syrup, molasses, cane sugar, corn sweetener, raw sugar, syrup, honey or fruit juice concentrate.   One of the best ways to limit your added sugars is to stop drinking sweetened beverages such as soda, sweet tea, and fruit juice. There is 65g of added sugars in one 20oz bottle of Coke! That is equal to 6 donuts.   Pay attention and read all nutrition facts labels. Below is an examples of a nutrition facts label. The #1 is showing you the total sugars where the # 2 is  showing you the added sugars. This one serving has almost the max amount of added sugars per day!     EXERCISE  Exercise is good. We've all heard that. In an ideal world, we would all have time and resources to get plenty of it. When you are active, your heart pumps more efficiently and you will feel better.  Multiple studies show that even walking regularly has benefits that include living a longer life. The American Heart Association recommends 150 minutes per week of exercise (30 minutes per day most days of the week). You can do this in any increment you wish. Nine or more 10-minute walks count. So does an hour-long exercise class. Break the time apart into what will work in your life. Some of the best things you can do include walking briskly, jogging, cycling or swimming laps. Not everyone is ready to "exercise." Sometimes we need to start with just getting active. Here are some easy ways to be more active throughout the day: Lowella Grip Take the stairs instead of the elevator . Go for a 10-15 minute walk during your lunch break (find a friend to make it more enjoyable) . When shopping, park at the back of the parking lot . If you take public transportation, get off one stop early and walk the extra distance . Pace around while making phone calls  Check with your doctor if you aren't sure what your limitations may be. Always remember to drink plenty of water when  doing any type of exercise. Don't feel like a failure if you're not getting the 90-150 minutes per week. If you started by being a couch potato, then just a 10-minute walk each day is a huge improvement. Start with little victories and work your way up.   HEALTHY EATING TIPS  When looking to improve your eating habits, whether to lose weight, lower blood pressure or just be healthier, it helps to know what a serving size is.   Grains 1 slice of bread,  bagel,  cup pasta or rice  Vegetables 1 cup fresh or raw vegetables,  cup cooked or  canned Fruits 1 piece of medium sized fruit,  cup canned,   Meats/Proteins  cup dried       1 oz meat, 1 egg,  cup cooked beans, nuts or seeds  Dairy        Fats Individual yogurt container, 1 cup (8oz)    1 teaspoon margarine/butter or vegetable  milk or milk alternative, 1 slice of cheese          oil; 1 tablespoon mayonnaise or salad dressing                  Plan ahead: make a menu of the meals for a week then create a grocery list to go with that menu. Consider meals that easily stretch into a night of leftovers, such as stews or casseroles. Or consider making two of your favorite meal and put one in the freezer for another night. Try a night or two each week that is "meatless" or "no cook" such as salads. When you get home from the grocery store wash and prepare your vegetables and fruits. Then when you need them they are ready to go.   Tips for going to the grocery store: . Buy store or generic brands . Check the weekly ad from your store on-line or in their in-store flyer . Look at the unit price on the shelf tag to compare/contrast the costs of different items . Buy fruits/vegetables in season . Carrots, bananas and apples are low-cost, naturally healthy items . If meats or frozen vegetables are on sale, buy some extras and put in your freezer . Limit buying prepared or "ready to eat" items, even if they are pre-made salads or fruit snacks . Do not shop when you're hungry . Foods at eye level tend to be more expensive. Look on the high and low shelves for deals. . Consider shopping at the farmer's market for fresh foods in season. Marland Kitchen Avoid the cookie and chip aisles (these are expensive, high in calories and low in nutritional value). Shop on the outside of the grocery store.  Healthy food preparations: . If you can't get lean hamburger, be sure to drain the fat when cooking . Steam, saut (in olive oil), grill or bake foods . Experiment with different seasonings to avoid adding  salt to your foods. Kosher salt, sea salt and Himalayan salt are all still salt and should be avoided. Try seasoning food with onion, garlic, thyme, rosemary, basil ect. Onion powder or garlic powder is ok. Avoid if it says salt (ie garlic salt).        Other resources: American Heart Association - MartiniMobile.it         Go to the Healthy Living tab to get more information American Diabetes Association - www.diabetes.org         You don't have to be diabetic - check out the Food  and Fitness tab

## 2020-05-15 ENCOUNTER — Other Ambulatory Visit: Payer: Self-pay | Admitting: Physician Assistant

## 2020-06-13 ENCOUNTER — Ambulatory Visit: Payer: Medicare Other | Admitting: Nurse Practitioner

## 2020-06-27 ENCOUNTER — Ambulatory Visit: Payer: Medicare Other | Admitting: Nurse Practitioner

## 2020-07-01 ENCOUNTER — Other Ambulatory Visit: Payer: Self-pay | Admitting: Nurse Practitioner

## 2020-07-01 DIAGNOSIS — Z09 Encounter for follow-up examination after completed treatment for conditions other than malignant neoplasm: Secondary | ICD-10-CM

## 2020-07-02 ENCOUNTER — Inpatient Hospital Stay: Admission: RE | Admit: 2020-07-02 | Payer: Medicare Other | Source: Ambulatory Visit

## 2020-07-04 ENCOUNTER — Other Ambulatory Visit: Payer: Self-pay | Admitting: Nurse Practitioner

## 2020-07-04 DIAGNOSIS — R921 Mammographic calcification found on diagnostic imaging of breast: Secondary | ICD-10-CM

## 2020-07-10 ENCOUNTER — Other Ambulatory Visit: Payer: Self-pay

## 2020-07-10 ENCOUNTER — Ambulatory Visit
Admission: RE | Admit: 2020-07-10 | Discharge: 2020-07-10 | Disposition: A | Discharge status: Home / Self Care | Payer: Medicare Other | Source: Ambulatory Visit | Attending: Nurse Practitioner | Admitting: Nurse Practitioner

## 2020-07-10 ENCOUNTER — Other Ambulatory Visit: Payer: Self-pay | Admitting: Nurse Practitioner

## 2020-07-10 DIAGNOSIS — R928 Other abnormal and inconclusive findings on diagnostic imaging of breast: Secondary | ICD-10-CM

## 2020-07-10 DIAGNOSIS — R921 Mammographic calcification found on diagnostic imaging of breast: Secondary | ICD-10-CM | POA: Diagnosis not present

## 2020-07-10 DIAGNOSIS — N6001 Solitary cyst of right breast: Secondary | ICD-10-CM | POA: Diagnosis not present

## 2020-07-10 DIAGNOSIS — R922 Inconclusive mammogram: Secondary | ICD-10-CM | POA: Diagnosis not present

## 2020-07-29 ENCOUNTER — Other Ambulatory Visit: Payer: Self-pay

## 2020-07-29 ENCOUNTER — Ambulatory Visit (INDEPENDENT_AMBULATORY_CARE_PROVIDER_SITE_OTHER): Payer: Medicare Other | Admitting: Nurse Practitioner

## 2020-07-29 ENCOUNTER — Encounter: Payer: Self-pay | Admitting: Nurse Practitioner

## 2020-07-29 VITALS — BP 130/74 | HR 83 | Temp 98.5°F | Wt 183.0 lb

## 2020-07-29 DIAGNOSIS — E782 Mixed hyperlipidemia: Secondary | ICD-10-CM

## 2020-07-29 DIAGNOSIS — E1165 Type 2 diabetes mellitus with hyperglycemia: Secondary | ICD-10-CM | POA: Diagnosis not present

## 2020-07-29 LAB — POCT GLYCOSYLATED HEMOGLOBIN (HGB A1C): Hemoglobin A1C: 6 % — AB (ref 4.0–5.6)

## 2020-07-29 MED ORDER — EZETIMIBE 10 MG PO TABS: 10.0000 mg | ORAL_TABLET | Freq: Every day | ORAL | 3 refills | Status: DC

## 2020-07-29 MED ORDER — METFORMIN HCL 500 MG PO TABS: 250.0000 mg | ORAL_TABLET | Freq: Every day | ORAL | 1 refills | Status: DC

## 2020-07-29 NOTE — Progress Notes (Signed)
Subjective:  Patient ID: Diamond Hughes, female    DOB: 04-Nov-1950  Age: 70 y.o. MRN: [id number]  CC: Follow-up (3 mo f/u DM. Pt states does not check bs at home.)  HPI  Type 2 diabetes mellitus with hyperglycemia, without long-term current use of insulin (HCC) POCT hgbA1c at 6.0 today Controlled with metformin and dietary changes. Maintain current dose F/up in 37months  Hyperlipidemia LDL at goal with zetia and atorvastatin Refill sent  Reviewed past Medical, Social and Family history today.  Outpatient Medications Prior to Visit  Medication Sig Dispense Refill   amLODipine (NORVASC) 5 MG tablet TAKE 1 TABLET(5 MG) BY MOUTH DAILY 90 tablet 3   aspirin 81 MG tablet Take 1 tablet (81 mg total) by mouth daily. 30 tablet 6   atorvastatin (LIPITOR) 80 MG tablet Take 1 tablet (80 mg total) by mouth daily at 6 PM. 90 tablet 3   clopidogrel (PLAVIX) 75 MG tablet Take 1 tablet by mouth once daily with breakfast 90 tablet 3   clotrimazole-betamethasone (LOTRISONE) cream Apply 1 application topically 2 (two) times daily. 30 g 0   levothyroxine (SYNTHROID) 75 MCG tablet Take 1 tablet (75 mcg total) by mouth daily before breakfast. 90 tablet 1   lisinopril (ZESTRIL) 20 MG tablet TAKE 1 TABLET(20 MG) BY MOUTH DAILY 90 tablet 3   metoprolol tartrate (LOPRESSOR) 50 MG tablet TAKE 1 TABLET(50 MG) BY MOUTH TWICE DAILY 180 tablet 3   nitroGLYCERIN (NITROSTAT) 0.4 MG SL tablet Place 1 tablet (0.4 mg total) under the tongue every 5 (five) minutes x 3 doses as needed for chest pain. 25 tablet 3   metFORMIN (GLUCOPHAGE) 500 MG tablet Take 0.5 tablets (250 mg total) by mouth daily with breakfast. 45 tablet 1   Semaglutide,0.25 or 0.5MG /DOS, (OZEMPIC, 0.25 OR 0.5 MG/DOSE,) 2 MG/1.5ML SOPN Inject 0.25 mg into the skin once a week.     ezetimibe (ZETIA) 10 MG tablet Take 1 tablet (10 mg total) by mouth daily. (Patient not taking: Reported on 07/29/2020) 90 tablet 3   No facility-administered medications  prior to visit.   ROS See HPI  Objective:  BP 130/74   Pulse 83   Temp 98.5 F (36.9 C) (Oral)   Wt 183 lb (83 kg)   SpO2 96%   BMI 32.94 kg/m   Physical Exam Cardiovascular:     Pulses: Normal pulses.  Pulmonary:     Effort: Pulmonary effort is normal.  Musculoskeletal:     Right lower leg: No edema.     Left lower leg: No edema.  Neurological:     Mental Status: She is alert.   Assessment & Plan:  This visit occurred during the SARS-CoV-2 public health emergency.  Safety protocols were in place, including screening questions prior to the visit, additional usage of staff PPE, and extensive cleaning of exam room while observing appropriate contact time as indicated for disinfecting solutions.   Deshawnda was seen today for follow-up.  Diagnoses and all orders for this visit:  Type 2 diabetes mellitus with hyperglycemia, without long-term current use of insulin (HCC) -     POCT glycosylated hemoglobin (Hb A1C) -     metFORMIN (GLUCOPHAGE) 500 MG tablet; Take 0.5 tablets (250 mg total) by mouth daily with breakfast.  Mixed hyperlipidemia -     ezetimibe (ZETIA) 10 MG tablet; Take 1 tablet (10 mg total) by mouth daily.   Problem List Items Addressed This Visit       Endocrine  Type 2 diabetes mellitus with hyperglycemia, without long-term current use of insulin (HCC) - Primary    POCT hgbA1c at 6.0 today Controlled with metformin and dietary changes. Maintain current dose F/up in 31months       Relevant Medications   metFORMIN (GLUCOPHAGE) 500 MG tablet   Other Relevant Orders   POCT glycosylated hemoglobin (Hb A1C) (Completed)     Other   Hyperlipidemia    LDL at goal with zetia and atorvastatin Refill sent       Relevant Medications   ezetimibe (ZETIA) 10 MG tablet   Follow-up: Return in about 6 months (around 01/29/2021) for DM and HTN, hyperlipidemia (fasting).  Alysia Penna, NP

## 2020-07-29 NOTE — Assessment & Plan Note (Signed)
POCT hgbA1c at 6.0 today Controlled with metformin and dietary changes. Maintain current dose F/up in 82months

## 2020-07-29 NOTE — Patient Instructions (Signed)
HgbA1c of 6.0 Continue current medications

## 2020-07-29 NOTE — Assessment & Plan Note (Signed)
LDL at goal with zetia and atorvastatin Refill sent

## 2020-09-23 ENCOUNTER — Telehealth: Payer: Self-pay | Admitting: Nurse Practitioner

## 2020-09-23 LAB — HEMOGLOBIN A1C: Hemoglobin A1C: 6.2

## 2020-10-22 ENCOUNTER — Ambulatory Visit (INDEPENDENT_AMBULATORY_CARE_PROVIDER_SITE_OTHER): Payer: Medicare Other | Admitting: Nurse Practitioner

## 2020-10-22 ENCOUNTER — Encounter: Payer: Self-pay | Admitting: Nurse Practitioner

## 2020-10-22 ENCOUNTER — Other Ambulatory Visit: Payer: Self-pay

## 2020-10-22 VITALS — BP 130/70 | HR 82 | Temp 97.1°F | Wt 181.2 lb

## 2020-10-22 DIAGNOSIS — E1165 Type 2 diabetes mellitus with hyperglycemia: Secondary | ICD-10-CM | POA: Diagnosis not present

## 2020-10-22 DIAGNOSIS — M109 Gout, unspecified: Secondary | ICD-10-CM | POA: Diagnosis not present

## 2020-10-22 DIAGNOSIS — E790 Hyperuricemia without signs of inflammatory arthritis and tophaceous disease: Secondary | ICD-10-CM | POA: Insufficient documentation

## 2020-10-22 LAB — BASIC METABOLIC PANEL
BUN: 18 mg/dL (ref 6–23)
CO2: 27 mEq/L (ref 19–32)
Calcium: 9.3 mg/dL (ref 8.4–10.5)
Chloride: 99 mEq/L (ref 96–112)
Creatinine, Ser: 0.64 mg/dL (ref 0.40–1.20)
GFR: 90.77 mL/min (ref >60.00)
Glucose, Bld: 94 mg/dL (ref 70–99)
Potassium: 4.4 mEq/L (ref 3.5–5.1)
Sodium: 136 mEq/L (ref 135–145)

## 2020-10-22 LAB — URIC ACID: Uric Acid, Serum: 7.4 mg/dL — ABNORMAL HIGH (ref 2.4–7.0)

## 2020-10-22 LAB — HEMOGLOBIN A1C: Hgb A1c MFr Bld: 6.4 % (ref 4.6–6.5)

## 2020-10-22 MED ORDER — INDOMETHACIN 25 MG PO CAPS: 25.0000 mg | ORAL_CAPSULE | Freq: Two times a day (BID) | ORAL | 0 refills | Status: DC | PRN

## 2020-10-22 NOTE — Patient Instructions (Addendum)
Go to lab for blood draw Use indomethacin for acute gout arthritis  Gout Gout is painful swelling of your joints. Gout is a type of arthritis. It is caused by having too much uric acid in your body. Uric acid is a chemical that is made when your body breaks down substances called purines. If your body has too much uric acid, sharp crystals can form and build up in your joints. This causes pain and swelling. Gout attacks can happen quickly and be very painful (acute gout). Over time, the attacks can affect more joints and happen more often (chronic gout). What are the causes? Too much uric acid in your blood. This can happen because: Your kidneys do not remove enough uric acid from your blood. Your body makes too much uric acid. You eat too many foods that are high in purines. These foods include organ meats, some seafood, and beer. Trauma or stress. What increases the risk? Having a family history of gout. Being female and middle-aged. Being female and having gone through menopause. Being very overweight (obese). Drinking alcohol, especially beer. Not having enough water in the body (being dehydrated). Losing weight too quickly. Having an organ transplant. Having lead poisoning. Taking certain medicines. Having kidney disease. Having a skin condition called psoriasis. What are the signs or symptoms? An attack of acute gout usually happens in just one joint. The most common place is the big toe. Attacks often start at night. Other joints that may be affected include joints of the feet, ankle, knee, fingers, wrist, or elbow. Symptoms of an attack may include: Very bad pain. Warmth. Swelling. Stiffness. Shiny, red, or purple skin. Tenderness. The affected joint may be very painful to touch. Chills and fever. Chronic gout may cause symptoms more often. More joints may be involved. You may also have white or yellow lumps (tophi) on your hands or feet or in other areas near your  joints. How is this treated? Treatment for this condition has two phases: treating an acute attack and preventing future attacks. Acute gout treatment may include: NSAIDs. Steroids. These are taken by mouth or injected into a joint. Colchicine. This medicine relieves pain and swelling. It can be given by mouth or through an IV tube. Preventive treatment may include: Taking small doses of NSAIDs or colchicine daily. Using a medicine that reduces uric acid levels in your blood. Making changes to your diet. You may need to see a food expert (dietitian) about what to eat and drink to prevent gout. Follow these instructions at home: During a gout attack  If told, put ice on the painful area: Put ice in a plastic bag. Place a towel between your skin and the bag. Leave the ice on for 20 minutes, 2-3 times a day. Raise (elevate) the painful joint above the level of your heart as often as you can. Rest the joint as much as possible. If the joint is in your leg, you may be given crutches. Follow instructions from your doctor about what you cannot eat or drink. Avoiding future gout attacks Eat a low-purine diet. Avoid foods and drinks such as: Liver. Kidney. Anchovies. Asparagus. Herring. Mushrooms. Mussels. Beer. Stay at a healthy weight. If you want to lose weight, talk with your doctor. Do not lose weight too fast. Start or continue an exercise plan as told by your doctor. Eating and drinking Drink enough fluids to keep your pee (urine) pale yellow. If you drink alcohol: Limit how much you use to: 0-1 drink  a day for women. 0-2 drinks a day for men. Be aware of how much alcohol is in your drink. In the [place] one drink equals one 12 oz bottle of beer (355 mL), one 5 oz glass of wine (148 mL), or one 1 oz glass of hard liquor (44 mL). General instructions Take over-the-counter and prescription medicines only as told by your doctor. Do not drive or use heavy machinery while taking  prescription pain medicine. Return to your normal activities as told by your doctor. Ask your doctor what activities are safe for you. Keep all follow-up visits as told by your doctor. This is important. Contact a doctor if: You have another gout attack. You still have symptoms of a gout attack after 10 days of treatment. You have problems (side effects) because of your medicines. You have chills or a fever. You have burning pain when you pee (urinate). You have pain in your lower back or belly. Get help right away if: You have very bad pain. Your pain cannot be controlled. You cannot pee. Summary Gout is painful swelling of the joints. The most common site of pain is the big toe, but it can affect other joints. Medicines and avoiding some foods can help to prevent and treat gout attacks. This information is not intended to replace advice given to you by your health care provider. Make sure you discuss any questions you have with your health care provider. Document Revised: 07/14/2017 Document Reviewed: 07/14/2017 Elsevier Patient Education  2022 [employer]   Low-Purine Eating Plan A low-purine eating plan involves making food choices to limit your intake of purine. Purine is a kind of uric acid. Too much uric acid in your blood can cause certain conditions, such as gout and kidney stones. Eating a low-purine diet can help control these conditions. What are tips for following this plan? Reading food labels Avoid foods with saturated or Trans fat. Check the ingredient list of grains-based foods, such as bread and cereal, to make sure that they contain whole grains. Check the ingredient list of sauces or soups to make sure they do not contain meat or fish. When choosing soft drinks, check the ingredient list to make sure they do not contain high-fructose corn syrup. Shopping  Buy plenty of fresh fruits and vegetables. Avoid buying canned or fresh fish. Buy dairy products labeled  as low-fat or nonfat. Avoid buying premade or processed foods. These foods are often high in fat, salt (sodium), and added sugar. Cooking Use olive oil instead of butter when cooking. Oils like olive oil, canola oil, and sunflower oil contain healthy fats. Meal planning Learn which foods do or do not affect you. If you find out that a food tends to cause your gout symptoms to flare up, avoid eating that food. You can enjoy foods that do not cause problems. If you have any questions about a food item, talk with your dietitian or health care provider. Limit foods high in fat, especially saturated fat. Fat makes it harder for your body to get rid of uric acid. Choose foods that are lower in fat and are lean sources of protein. General guidelines Limit alcohol intake to no more than 1 drink a day for nonpregnant women and 2 drinks a day for men. One drink equals 12 oz of beer, 5 oz of wine, or 1 oz of hard liquor. Alcohol can affect the way your body gets rid of uric acid. Drink plenty of water to keep your urine clear  or pale yellow. Fluids can help remove uric acid from your body. If directed by your health care provider, take a vitamin C supplement. Work with your health care provider and dietitian to develop a plan to achieve or maintain a healthy weight. Losing weight can help reduce uric acid in your blood. What foods are recommended? The items listed may not be a complete list. Talk with your dietitian about what dietary choices are best for you. Foods low in purines Foods low in purines do not need to be limited. These include: All fruits. All low-purine vegetables, pickles, and olives. Breads, pasta, rice, cornbread, and popcorn. Cake and other baked goods. All dairy foods. Eggs, nuts, and nut butters. Spices and condiments, such as salt, herbs, and vinegar. Plant oils, butter, and margarine. Water, sugar-free soft drinks, tea, coffee, and cocoa. Vegetable-based soups, broths, sauces,  and gravies. Foods moderate in purines Foods moderate in purines should be limited to the amounts listed.  cup of asparagus, cauliflower, spinach, mushrooms, or green peas, each day. 2/3 cup uncooked oatmeal, each day.  cup dry wheat bran or wheat germ, each day. 2-3 ounces of meat or poultry, each day. 4-6 ounces of shellfish, such as crab, lobster, oysters, or shrimp, each day. 1 cup cooked beans, peas, or lentils, each day. Soup, broths, or bouillon made from meat or fish. Limit these foods as much as possible. What foods are not recommended? The items listed may not be a complete list. Talk with your dietitian about what dietary choices are best for you. Limit your intake of foods high in purines, including: Beer and other alcohol. Meat-based gravy or sauce. Canned or fresh fish, such as: Anchovies, sardines, herring, and tuna. Mussels and scallops. Codfish, trout, and haddock. Tomasa Blase. Organ meats, such as: Liver or kidney. Tripe. Sweetbreads (thymus gland or pancreas). Wild Education officer, environmental. Yeast or yeast extract supplements. Drinks sweetened with high-fructose corn syrup. Summary Eating a low-purine diet can help control conditions caused by too much uric acid in the body, such as gout or kidney stones. Choose low-purine foods, limit alcohol, and limit foods high in fat. You will learn over time which foods do or do not affect you. If you find out that a food tends to cause your gout symptoms to flare up, avoid eating that food. This information is not intended to replace advice given to you by your health care provider. Make sure you discuss any questions you have with your health care provider. Document Revised: 04/06/2019 Document Reviewed: 04/06/2019 Elsevier Patient Education  2022 [employer].

## 2020-10-22 NOTE — Progress Notes (Signed)
Subjective:  Patient ID: Diamond Hughes, female    DOB: 1950/12/09  Age: 70 y.o. MRN: [id number]  CC: Acute Visit (Pt c/o possible gout in both big toes x 1.5 months. Pt states it comes and goes but one it does happen whichever toe it affects starts to swell. /Declines vaccines today. )  Toe Pain  The incident occurred more than 1 week ago. There was no injury mechanism. The pain is present in the right toes. The quality of the pain is described as aching and burning. The pain is moderate. The pain has been Constant since onset. Associated symptoms include an inability to bear weight. Pertinent negatives include no loss of motion, loss of sensation, muscle weakness, numbness or tingling. She reports no foreign bodies present. The symptoms are aggravated by movement, palpation and weight bearing. She has tried nothing for the symptoms.  Avoid colchicine due to drug interaction with atorvastatin  Reviewed past Medical, Social and Family history today.  Outpatient Medications Prior to Visit  Medication Sig Dispense Refill   amLODipine (NORVASC) 5 MG tablet TAKE 1 TABLET(5 MG) BY MOUTH DAILY 90 tablet 3   aspirin 81 MG tablet Take 1 tablet (81 mg total) by mouth daily. 30 tablet 6   atorvastatin (LIPITOR) 80 MG tablet Take 1 tablet (80 mg total) by mouth daily at 6 PM. 90 tablet 3   clopidogrel (PLAVIX) 75 MG tablet Take 1 tablet by mouth once daily with breakfast 90 tablet 3   clotrimazole-betamethasone (LOTRISONE) cream Apply 1 application topically 2 (two) times daily. 30 g 0   ezetimibe (ZETIA) 10 MG tablet Take 1 tablet (10 mg total) by mouth daily. 90 tablet 3   levothyroxine (SYNTHROID) 75 MCG tablet Take 1 tablet (75 mcg total) by mouth daily before breakfast. 90 tablet 1   lisinopril (ZESTRIL) 20 MG tablet TAKE 1 TABLET(20 MG) BY MOUTH DAILY 90 tablet 3   metFORMIN (GLUCOPHAGE) 500 MG tablet Take 0.5 tablets (250 mg total) by mouth daily with breakfast. 45 tablet 1   metoprolol  tartrate (LOPRESSOR) 50 MG tablet TAKE 1 TABLET(50 MG) BY MOUTH TWICE DAILY 180 tablet 3   nitroGLYCERIN (NITROSTAT) 0.4 MG SL tablet Place 1 tablet (0.4 mg total) under the tongue every 5 (five) minutes x 3 doses as needed for chest pain. 25 tablet 3   No facility-administered medications prior to visit.    ROS See HPI  Objective:  BP 130/70 (BP Location: Left Arm, Patient Position: Sitting, Cuff Size: Large)   Pulse 82   Temp (!) 97.1 F (36.2 C) (Temporal)   Wt 181 lb 3.2 oz (82.2 kg)   SpO2 98%   BMI 32.61 kg/m   Physical Exam Cardiovascular:     Pulses: Normal pulses.          Dorsalis pedis pulses are 2+ on the right side and 2+ on the left side.       Posterior tibial pulses are 2+ on the right side and 2+ on the left side.  Pulmonary:     Effort: Pulmonary effort is normal.  Musculoskeletal:        General: Swelling and tenderness present. No signs of injury.     Right foot: Normal range of motion. Bunion present.     Left foot: Normal range of motion. Bunion present.  Feet:     Right foot:     Skin integrity: Erythema, warmth and dry skin present. No ulcer, blister, skin breakdown, callus or fissure.  Toenail Condition: Right toenails are normal.     Left foot:     Skin integrity: Skin integrity normal. No ulcer, blister or skin breakdown.     Toenail Condition: Left toenails are normal.  Neurological:     Mental Status: She is alert and oriented to person, place, and time.    Assessment & Plan:  This visit occurred during the SARS-CoV-2 public health emergency.  Safety protocols were in place, including screening questions prior to the visit, additional usage of staff PPE, and extensive cleaning of exam room while observing appropriate contact time as indicated for disinfecting solutions.   Diamond Hughes was seen today for acute visit.  Diagnoses and all orders for this visit:  Gouty arthritis of right great toe -     Basic metabolic panel -     Uric acid -      indomethacin (INDOCIN) 25 MG capsule; Take 1 capsule (25 mg total) by mouth 2 (two) times daily as needed. With food  Type 2 diabetes mellitus with hyperglycemia, without long-term current use of insulin (HCC) -     Hemoglobin A1c -     Basic metabolic panel Use indomethacin for acute gout arthritis Provided printed information on gout, need for adequate oral hydration and low purine diet.  Problem List Items Addressed This Visit       Endocrine   Type 2 diabetes mellitus with hyperglycemia, without long-term current use of insulin (HCC)   Relevant Orders   Hemoglobin A1c   Basic metabolic panel   Other Visit Diagnoses     Gouty arthritis of right great toe    -  Primary   Relevant Medications   indomethacin (INDOCIN) 25 MG capsule   Other Relevant Orders   Basic metabolic panel   Uric acid       Follow-up: No follow-ups on file.  Alysia Penna, NP

## 2020-11-20 LAB — COLOGUARD: Cologuard: NEGATIVE

## 2020-11-26 LAB — COLOGUARD: COLOGUARD: NEGATIVE

## 2020-12-06 ENCOUNTER — Telehealth: Payer: Self-pay | Admitting: Nurse Practitioner

## 2020-12-06 NOTE — Telephone Encounter (Signed)
Pt is wanting a call back concerning her most recent Cologuard results from 11/21/20. Please advise pt at [phone].

## 2020-12-06 NOTE — Telephone Encounter (Signed)
Returned patients call no answer, unable to LM.

## 2020-12-09 NOTE — Telephone Encounter (Signed)
Returned patients call, no answer unable to leave a message.

## 2020-12-10 NOTE — Telephone Encounter (Signed)
Pt called back noting new ph # [phone].  Please call with Cologuard results.

## 2020-12-11 NOTE — Telephone Encounter (Signed)
Pt informed and verbalized understanding

## 2021-01-02 ENCOUNTER — Other Ambulatory Visit: Payer: Self-pay | Admitting: Physician Assistant

## 2021-01-02 DIAGNOSIS — I10 Essential (primary) hypertension: Secondary | ICD-10-CM

## 2021-01-06 ENCOUNTER — Other Ambulatory Visit: Payer: Self-pay | Admitting: Nurse Practitioner

## 2021-01-06 DIAGNOSIS — E039 Hypothyroidism, unspecified: Secondary | ICD-10-CM

## 2021-01-07 NOTE — Telephone Encounter (Signed)
Chart supports Rx Last seen 10/22/20 Next OV 01/29/21

## 2021-01-28 ENCOUNTER — Telehealth: Payer: Self-pay | Admitting: Pharmacist

## 2021-01-28 ENCOUNTER — Other Ambulatory Visit: Payer: Self-pay | Admitting: Nurse Practitioner

## 2021-01-28 DIAGNOSIS — E1165 Type 2 diabetes mellitus with hyperglycemia: Secondary | ICD-10-CM

## 2021-01-28 MED ORDER — OZEMPIC (0.25 OR 0.5 MG/DOSE) 2 MG/1.5ML ~~LOC~~ SOPN: PEN_INJECTOR | SUBCUTANEOUS | 1 refills | Status: DC

## 2021-01-28 NOTE — Telephone Encounter (Signed)
Spoke with pt regarding GLP therapy. Started on Ozempic last year, med removed from her list at subsequent PCP visit. Pt states she was advised she didn't need the Ozempic so she didn't start it. Discussed that her A1c is well controlled on metformin at 6.4% but that the Ozempic also helps with weight loss and has added cardiovascular benefit. Pt wishes to start Ozempic. New rx sent to pharmacy. Pt aware to start at 0.25mg  once weekly for the first month. I'll call her in a month for dose titration.

## 2021-01-29 ENCOUNTER — Ambulatory Visit: Payer: Medicare Other | Admitting: Nurse Practitioner

## 2021-02-03 ENCOUNTER — Other Ambulatory Visit: Payer: Self-pay | Admitting: Nurse Practitioner

## 2021-02-03 DIAGNOSIS — E1165 Type 2 diabetes mellitus with hyperglycemia: Secondary | ICD-10-CM

## 2021-02-03 NOTE — Telephone Encounter (Signed)
Pt states she is running out of metformin and only has a few days left. Scheduled appointment for 02/04/21 @ 1:30 pm. Pt states she received ozempic samples from a pharmacist at her Cardiologist office but she never took the medication.

## 2021-02-04 ENCOUNTER — Ambulatory Visit (INDEPENDENT_AMBULATORY_CARE_PROVIDER_SITE_OTHER): Payer: Medicare Other | Admitting: Nurse Practitioner

## 2021-02-04 ENCOUNTER — Encounter: Payer: Self-pay | Admitting: Nurse Practitioner

## 2021-02-04 ENCOUNTER — Other Ambulatory Visit: Payer: Self-pay

## 2021-02-04 VITALS — BP 138/84 | HR 80 | Temp 97.3°F | Ht 63.0 in | Wt 179.8 lb

## 2021-02-04 DIAGNOSIS — M109 Gout, unspecified: Secondary | ICD-10-CM | POA: Diagnosis not present

## 2021-02-04 DIAGNOSIS — E039 Hypothyroidism, unspecified: Secondary | ICD-10-CM | POA: Diagnosis not present

## 2021-02-04 DIAGNOSIS — E1165 Type 2 diabetes mellitus with hyperglycemia: Secondary | ICD-10-CM | POA: Diagnosis not present

## 2021-02-04 NOTE — Patient Instructions (Addendum)
Ok to hold ozempic injection Let me know if you decide to start injection continue metformin  Sign medical release form to get records from your opthalmologist  Go to lab for blood draw

## 2021-02-04 NOTE — Progress Notes (Addendum)
Subjective:  Patient ID: Diamond Hughes, female    DOB: [birth year]  Age: 71 y.o. MRN: [id number]  CC: Follow-up (Follow up on DM and medication refills needed./Pt would also like to discuss if there are any benefits to Ozempic )  HPI   Subjective:  Patient ID: Diamond Hughes, female    DOB: [birth year]  Age: 71 y.o. MRN: [id number]  CC: Follow-up (Follow up on DM and medication refills needed./Pt would also like to discuss if there are any benefits to Ozempic )   HPI  Acquired hypothyroidism Repeat TSH and T4:  Stable Tsh and T4: maintain levothyroxine dose.   Gouty arthritis of right great toe Resolved pain and swelling Repeat uric acid: Uric acid remains at 7.8: still slightly elevated. Work on maintaining low purine diet. No medication needed at this time. Repeat in 3-49months or sooner if any gout exacerbation.   Type 2 diabetes mellitus with hyperglycemia, without long-term current use of insulin (HCC) Controlled with metformin Last hgbA1c at 6.4% No glucose check at home Up to ate with Dm eye exam, report requested I answered her questions about the pros and cons of ozempic injection vs metformin. She decided to maintain only metformin regimen at the time.  Repeat hgbA1c today:Stable hgbA1c at 6.5%: maintain metformin dose.   Reviewed past Medical, Social and Family history today.  Outpatient Medications Prior to Visit  Medication Sig Dispense Refill   amLODipine (NORVASC) 5 MG tablet TAKE 1 TABLET(5 MG) BY MOUTH DAILY 90 tablet 3   aspirin 81 MG tablet Take 1 tablet (81 mg total) by mouth daily. 30 tablet 6   atorvastatin (LIPITOR) 80 MG tablet Take 1 tablet (80 mg total) by mouth daily at 6 PM. 90 tablet 3   clopidogrel (PLAVIX) 75 MG tablet Take 1 tablet by mouth once daily with breakfast 90 tablet 3   clotrimazole-betamethasone (LOTRISONE) cream Apply 1 application topically 2 (two) times daily. 30 g 0   co-enzyme Q-10 30 MG capsule Take 30 mg by mouth 3  (three) times daily.     ezetimibe (ZETIA) 10 MG tablet Take 1 tablet (10 mg total) by mouth daily. 90 tablet 3   indomethacin (INDOCIN) 25 MG capsule Take 1 capsule (25 mg total) by mouth 2 (two) times daily as needed. With food 15 capsule 0   KRILL OIL PO Take by mouth.     levothyroxine (SYNTHROID) 75 MCG tablet TAKE 1 TABLET(75 MCG) BY MOUTH DAILY BEFORE BREAKFAST 90 tablet 1   lisinopril (ZESTRIL) 20 MG tablet TAKE 1 TABLET(20 MG) BY MOUTH DAILY 90 tablet 3   metFORMIN (GLUCOPHAGE) 500 MG tablet TAKE 1/2 TABLET(250 MG) BY MOUTH DAILY WITH BREAKFAST 30 tablet 0   metoprolol tartrate (LOPRESSOR) 50 MG tablet TAKE 1 TABLET(50 MG) BY MOUTH TWICE DAILY 180 tablet 3   nitroGLYCERIN (NITROSTAT) 0.4 MG SL tablet Place 1 tablet (0.4 mg total) under the tongue every 5 (five) minutes x 3 doses as needed for chest pain. 25 tablet 3   VITAMIN D PO Take by mouth.     Semaglutide,0.25 or 0.5MG /DOS, (OZEMPIC, 0.25 OR 0.5 MG/DOSE,) 2 MG/1.5ML SOPN Inject 0.25mg  subcutaneously once weekly for 4 weeks, then increase to 0.5mg  subcutaneously once weekly for 4 weeks (Patient not taking: Reported on 02/04/2021) 1.5 mL 1   No facility-administered medications prior to visit.    ROS See HPI  Objective:  BP 138/84 (BP Location: Left Arm, Patient Position: Sitting, Cuff Size: Large)    Pulse 80  Temp (!) 97.3 F (36.3 C) (Temporal)    Ht 5\' 3"  (1.6 m)    Wt 179 lb 12.8 oz (81.6 kg)    SpO2 96%    BMI 31.85 kg/m   Physical Exam Cardiovascular:     Rate and Rhythm: Normal rate.     Pulses: Normal pulses.  Pulmonary:     Effort: Pulmonary effort is normal.  Neurological:     Mental Status: She is alert and oriented to person, place, and time.    Assessment & Plan:  This visit occurred during the SARS-CoV-2 public health emergency.  Safety protocols were in place, including screening questions prior to the visit, additional usage of staff PPE, and extensive cleaning of exam room while observing appropriate  contact time as indicated for disinfecting solutions.   Diamond Hughes was seen today for follow-up.  Diagnoses and all orders for this visit:  Type 2 diabetes mellitus with hyperglycemia, without long-term current use of insulin (HCC) -     Hemoglobin A1c  Acquired hypothyroidism -     TSH -     T4, free  Gouty arthritis of right great toe -     Uric acid   Problem List Items Addressed This Visit       Endocrine   Acquired hypothyroidism    Repeat TSH and T4:  Stable Tsh and T4: maintain levothyroxine dose.       Relevant Orders   TSH (Completed)   T4, free (Completed)   Type 2 diabetes mellitus with hyperglycemia, without long-term current use of insulin (HCC) - Primary    Controlled with metformin Last hgbA1c at 6.4% No glucose check at home Up to ate with Dm eye exam, report requested I answered her questions about the pros and cons of ozempic injection vs metformin. She decided to maintain only metformin regimen at the time.  Repeat hgbA1c today:Stable hgbA1c at 6.5%: maintain metformin dose.       Relevant Orders   Hemoglobin A1c (Completed)     Musculoskeletal and Integument   Gouty arthritis of right great toe    Resolved pain and swelling Repeat uric acid: Uric acid remains at 7.8: still slightly elevated. Work on maintaining low purine diet. No medication needed at this time. Repeat in 3-28months or sooner if any gout exacerbation.       Relevant Orders   Uric acid (Completed)    Follow-up: Return in about 3 months (around 05/04/2021) for DM and HTN, hyperlipidemia (fatsing).  05/06/2021, NP

## 2021-02-04 NOTE — Assessment & Plan Note (Addendum)
Controlled with metformin Last hgbA1c at 6.4% No glucose check at home Up to ate with Dm eye exam, report requested I answered her questions about the pros and cons of ozempic injection vs metformin. She decided to maintain only metformin regimen at the time.  Repeat hgbA1c today:Stable hgbA1c at 6.5%: maintain metformin dose.

## 2021-02-04 NOTE — Assessment & Plan Note (Addendum)
Resolved pain and swelling Repeat uric acid: Uric acid remains at 7.8: still slightly elevated. Work on maintaining low purine diet. No medication needed at this time. Repeat in 3-44months or sooner if any gout exacerbation.

## 2021-02-04 NOTE — Assessment & Plan Note (Addendum)
Repeat TSH and T4:  Stable Tsh and T4: maintain levothyroxine dose.

## 2021-02-05 LAB — T4, FREE: Free T4: 0.66 ng/dL (ref 0.60–1.60)

## 2021-02-05 LAB — URIC ACID: Uric Acid, Serum: 7.8 mg/dL — ABNORMAL HIGH (ref 2.4–7.0)

## 2021-02-05 LAB — HEMOGLOBIN A1C: Hgb A1c MFr Bld: 6.5 % (ref 4.6–6.5)

## 2021-02-05 LAB — TSH: TSH: 4.17 u[IU]/mL (ref 0.35–5.50)

## 2021-02-05 NOTE — Addendum Note (Signed)
Addended by: Leana Gamer on: 02/05/2021 03:49 PM   Modules accepted: Level of Service

## 2021-02-06 ENCOUNTER — Telehealth: Payer: Self-pay | Admitting: Nurse Practitioner

## 2021-02-06 NOTE — Telephone Encounter (Signed)
Pt is wanting a call back concerning her most recent lab results. Please advise pt at [phone].

## 2021-02-06 NOTE — Telephone Encounter (Signed)
Patient notified VIA phone of results.  No questions. Dm/cma

## 2021-02-06 NOTE — Telephone Encounter (Signed)
Lft VM to rtn call. Dm/cma  

## 2021-02-25 ENCOUNTER — Telehealth: Payer: Self-pay | Admitting: Pharmacist

## 2021-02-25 NOTE — Telephone Encounter (Signed)
Called pt to follow up with Ozempic. She saw her PCP a week after I spoke with her last, note reports pt elected to stay on metformin and not try Ozempic.  Called pt, she states she is still interested in trying Ozempic. She will bring in her pen tomorrow so I can review injection technique. She will plan to stop metformin 250mg  daily at that time (A1c 6.5%).

## 2021-02-26 ENCOUNTER — Ambulatory Visit: Payer: Medicare Other | Admitting: Pharmacist

## 2021-02-26 ENCOUNTER — Other Ambulatory Visit: Payer: Self-pay

## 2021-02-26 VITALS — Ht 63.0 in | Wt 179.0 lb

## 2021-02-26 DIAGNOSIS — Z6831 Body mass index (BMI) 31.0-31.9, adult: Secondary | ICD-10-CM

## 2021-02-26 DIAGNOSIS — E669 Obesity, unspecified: Secondary | ICD-10-CM

## 2021-02-26 NOTE — Progress Notes (Signed)
Patient ID: Diamond Hughes                 DOB: 07-Oct-1950                    MRN: [id number]     HPI: Diamond Hughes is a 71 y.o. female patient of Dr Angelena Form referred to pharmacy clinic by Clifton Custard, PA to initiate weight loss therapy with GLP1-RA. PMH is significant for obesity complicated by chronic medical conditions including HTN, HLD, CAD s/p MI treated with stent to RCA in 2000, NSTEMI 02/2016 with severe in-stent restenosis of RCA and 50-70% prox RCA with large saccular aneurysm and 99% circumflex. She had DES to the circumflex and failed angioplasty to the RCA in-stent restenosis and distal de novo disease due to inability to cross the balloon.  Medical therapy recommended for the RCA lesion. 2D echo 02/2017 showed LVEF 45 to 50% with basal to mid inferior posterior hypokinesis and grade 1 DD.  Frequent PVCs on prior EKGs and was advised to reduce caffeine and  alcohol consumption. A1c 6.6% in March 2022 and pt was started on metformin. Seen by Clifton Custard 04/18/20 and inquired about starting supplement called Golo to help with weight loss. Current BMI 31.71.  I called pt and discussed that OTC supplementation is not regulated the same way other prescription meds are, and that efficacy/safety data is not well know. Discussed starting GLP1-RA like Ozempic instead which shows great weight loss and also would help her with recently diagnosed DM along with added benefit of CV risk reduction given her extensive CAD history. Ozempic is also cheaper than the Golo supplement. I saw her 05/13/20 to start Ozempic. She ended up seeing her primary care NP who stopped Ozempic. I followed up again with pt in 2023, she again wanted to try Ozempic but again saw NP with primary care after who wrote that pt was just going to continue on metformin. When I called pt after to discuss, she stated she still wanted to try Ozempic. Unclear where recurrent communication issue had been but pt presents again today to review  Ozempic injection technique.  Pt has 4 pens of the Ozempic 0.25-0.91m at home. Had been taking metformin 2523mdaily, A1c 6.5%.  Current weight management medications: none  Previously tried meds: none  Current meds that may affect weight: none  Baseline weight/BMI: 179 lbs, 31.71  [place]: [place] or $131/3 mo   Diet:  -Breakfast: Activia yogurt, a few slices of bacon (not regularly), OJ -Lunch: [employer]and[employer] -Dinner: fish and chicken, limits red meat and pasta. Sometimes will have Slim Fast -Snacks: tuKuwaitr ham deli meat -Drinks: a few beer, milk, sweet tea (2 cups per day)   Exercise: Nordic track bike at home. Joined [employer]. Lives on the 3rd floor and walks a bit.   Family History: Mother with heart disease, HTN, and HLD. Sister with HTN and HLD. Maternal grandmother with breast cancer.   Social History: Former smoker, drinks a few beers regularly, denies drug use.  Labs: Lab Results  Component Value Date   HGBA1C 6.5 02/04/2021    Wt Readings from Last 1 Encounters:  02/04/21 179 lb 12.8 oz (81.6 kg)    BP Readings from Last 1 Encounters:  02/04/21 138/84   Pulse Readings from Last 1 Encounters:  02/04/21 80       Component Value Date/Time   CHOL 168 03/13/2020 1356  CHOL 161 11/30/2018 0923   TRIG 119.0 03/13/2020 1356   HDL 75.40 03/13/2020 1356   HDL 72 11/30/2018 0923   CHOLHDL 2 03/13/2020 1356   VLDL 23.8 03/13/2020 1356   LDLCALC 69 03/13/2020 1356   LDLCALC 70 11/30/2018 0923    Past Medical History:  Diagnosis Date   CAD (coronary artery disease)    a. 2000 s/p MI and RCA PCI [facility]);  b. 02/2016 NSTEMI/PCI: LM nl, LAD nl, LCX 100p (3.5x18 Resolute Onyx DES), OM1/3 small, RCA 70p aneurysmal, 62md ISR-->attempted PCI, could not cross w/ wire-->med rx, EF 45-50%.   Hyperlipidemia    Hypertension    Ischemic cardiomyopathy    a. 02/2016 Echo: EF 45-50%, basal-mid inferior/posterior HK, Gr1 DD, triv  MR, nl LA size, nl RV fxn.   Normocytic anemia     Current Outpatient Medications on File Prior to Visit  Medication Sig Dispense Refill   amLODipine (NORVASC) 5 MG tablet TAKE 1 TABLET(5 MG) BY MOUTH DAILY 90 tablet 3   aspirin 81 MG tablet Take 1 tablet (81 mg total) by mouth daily. 30 tablet 6   atorvastatin (LIPITOR) 80 MG tablet Take 1 tablet (80 mg total) by mouth daily at 6 PM. 90 tablet 3   clopidogrel (PLAVIX) 75 MG tablet Take 1 tablet by mouth once daily with breakfast 90 tablet 3   clotrimazole-betamethasone (LOTRISONE) cream Apply 1 application topically 2 (two) times daily. 30 g 0   co-enzyme Q-10 30 MG capsule Take 30 mg by mouth 3 (three) times daily.     ezetimibe (ZETIA) 10 MG tablet Take 1 tablet (10 mg total) by mouth daily. 90 tablet 3   indomethacin (INDOCIN) 25 MG capsule Take 1 capsule (25 mg total) by mouth 2 (two) times daily as needed. With food 15 capsule 0   KRILL OIL PO Take by mouth.     levothyroxine (SYNTHROID) 75 MCG tablet TAKE 1 TABLET(75 MCG) BY MOUTH DAILY BEFORE BREAKFAST 90 tablet 1   lisinopril (ZESTRIL) 20 MG tablet TAKE 1 TABLET(20 MG) BY MOUTH DAILY 90 tablet 3   metFORMIN (GLUCOPHAGE) 500 MG tablet TAKE 1/2 TABLET(250 MG) BY MOUTH DAILY WITH BREAKFAST 30 tablet 0   metoprolol tartrate (LOPRESSOR) 50 MG tablet TAKE 1 TABLET(50 MG) BY MOUTH TWICE DAILY 180 tablet 3   nitroGLYCERIN (NITROSTAT) 0.4 MG SL tablet Place 1 tablet (0.4 mg total) under the tongue every 5 (five) minutes x 3 doses as needed for chest pain. 25 tablet 3   VITAMIN D PO Take by mouth.     No current facility-administered medications on file prior to visit.    No Known Allergies   Assessment/Plan:  1. Weight loss - Patient has not met goal of at least 5% of body weight loss with comprehensive lifestyle modifications alone in the past 3-6 months. Pharmacotherapy is appropriate to pursue as augmentation. Will start Ozempic 0.254monce weekly and stop metformin 25038maily.  Confirmed patient not pregnant and no personal or family history of medullary thyroid carcinoma (MTC) or Multiple Endocrine Neoplasia syndrome type 2 (MEN 2).   Advised patient on common side effects including nausea, diarrhea, dyspepsia, decreased appetite, and fatigue. Counseled patient on reducing meal size and how to titrate medication to minimize side effects. Counseled patient to call if intolerable side effects or if experiencing dehydration, abdominal pain, or dizziness. Patient will adhere to dietary modifications and will target at least 150 minutes of moderate intensity exercise weekly.   Injection technique reviewed  at today's visit and patient successfully self-administered first dose of Ozempic 0.27m into the fatty tissue of the abdomen.  Titration Plan:  Will plan to follow the titration plan as below, pending patient is tolerating each dose before increasing to the next. Can slow titration if needed for tolerability.    -Month 1: Inject Ozempic 0.260mSQ once weekly x 4 weeks -Month 2-4: Inject Ozempic 0.54m60mQ once weekly x 4 weeks*will continue this dose for an extra 2 months since pt has 2 additional pens of this strength at home already -Month 5: Inject Ozempic 1mg69m once weekly x 4 weeks -Month 6+: Inject Ozempic 2mg 57monce weekly   Follow up in 1 month via telephone for dose titration.  Kemet Nijjar E. Jaaziel Peatross, PharmD, BCACP, CPP CRandolph [id number]: [phone]: [phone]/2023 11:07 AM

## 2021-02-26 NOTE — Patient Instructions (Addendum)
Ozempic Counseling Points This medication reduces your appetite and may make you feel fuller longer.  Stop eating when your body tells you that you are full. This will likely happen sooner than you are used to. Store your medication in the fridge until you are ready to use it. Inject your medication in the fatty tissue of your lower abdominal area (2 inches away from belly button) or upper outer thigh. Rotate injection sites. Each pen will last you about 1 month (the first month it will last a few weeks longer). Use a different needle with each weekly injection. Common side effects include: nausea, diarrhea/constipation, and heartburn, and are more likely to occur if you overeat.  Dosing schedule: -Month 1: Inject Ozempic 0.25mg  in the fatty tissue of your stomach once weekly x 4 weeks -Month 2: Inject Ozempic 0.5mg  once weekly x 4 weeks -Month 3: Inject Ozempic 1mg  once weekly x 4 weeks -Month 4+: Inject Ozempic 2mg  once weekly   Stop taking your metformin  I'll call you in 1 month to see how you're doing  Tips for living a healthier life     Building a Healthy and Balanced Diet Make most of your meal vegetables and fruits -  of your plate. Aim for color and variety, and remember that potatoes dont count as vegetables on the Healthy Eating Plate because of their negative impact on blood sugar.  Go for whole grains -  of your plate. Whole and intact grains--whole wheat, barley, wheat berries, quinoa, oats, brown rice, and foods made with them, such as whole wheat pasta--have a milder effect on blood sugar and insulin than white bread, white rice, and other refined grains.  Protein power -  of your plate. Fish, poultry, beans, and nuts are all healthy, versatile protein sources--they can be mixed into salads, and pair well with vegetables on a plate. Limit red meat, and avoid processed meats such as bacon and sausage.  Healthy plant oils - in moderation. Choose healthy vegetable  oils like olive, canola, soy, corn, sunflower, peanut, and others, and avoid partially hydrogenated oils, which contain unhealthy trans fats. Remember that low-fat does not mean healthy.  Drink water, coffee, or tea. Skip sugary drinks, limit milk and dairy products to one to two servings per day, and limit juice to a small glass per day.  Stay active. The red figure running across the Healthy Eating Plates placemat is a reminder that staying active is also important in weight control.  The main message of the Healthy Eating Plate is to focus on diet quality:  The type of carbohydrate in the diet is more important than the amount of carbohydrate in the diet, because some sources of carbohydrate--like vegetables (other than potatoes), fruits, whole grains, and beans--are healthier than others. The Healthy Eating Plate also advises consumers to avoid sugary beverages, a major source of calories--usually with little nutritional value--in the American diet. The Healthy Eating Plate encourages consumers to use healthy oils, and it does not set a maximum on the percentage of calories people should get each day from healthy sources of fat. In this way, the Healthy Eating Plate recommends the opposite of the low-fat message promoted for decades by the USDA.  [employer].ee  SUGAR  Sugar is a huge problem in the modern day diet. Sugar is a big contributor to heart disease, diabetes, high triglyceride levels, fatty liver disease and obesity. Sugar is hidden in almost all packaged foods/beverages. Added sugar is extra sugar that is added beyond  what is naturally found and has no nutritional benefit for your body. The American Heart Association recommends limiting added sugars to no more than 25g for women and 36 grams for men per day. There are many names for sugar including maltose, sucrose (names ending in "ose"), high fructose corn syrup, molasses,  cane sugar, corn sweetener, raw sugar, syrup, honey or fruit juice concentrate.   One of the best ways to limit your added sugars is to stop drinking sweetened beverages such as soda, sweet tea, and fruit juice.  There is 65g of added sugars in one 20oz bottle of Coke! That is equal to 7.5 donuts.   Pay attention and read all nutrition facts labels. Below is an examples of a nutrition facts label. The #1 is showing you the total sugars where the # 2 is showing you the added sugars. This one serving has almost the max amount of added sugars per day!     20 oz Soda 65g Sugar = 7.5 Glazed Donuts  16oz Energy  Drink 54g Sugar = 6.5 Glazed Donuts  Large Sweet  Tea 38g Sugar = 4 Glazed Donuts  20oz Sports  Drink 34g Sugar = 3.5 Glazed Donuts  8oz Chocolate Milk 24g Sugar =2.5 Glazed Donuts  8oz Orange  Juice 21g Sugar = 2 Glazed Donuts  1 Juice Box 14g Sugar = 1.5 Glazed Donuts  16oz Water= NO SUGAR!!  EXERCISE  Exercise is good. Weve all heard that. In an ideal world, we would all have time and resources to get plenty of it. When you are active, your heart pumps more efficiently and you will feel better.  Multiple studies show that even walking regularly has benefits that include living a longer life. The American Heart Association recommends 150 minutes per week of exercise (30 minutes per day most days of the week). You can do this in any increment you wish. Nine or more 10-minute walks count. So does an hour-long exercise class. Break the time apart into what will work in your life. Some of the best things you can do include walking briskly, jogging, cycling or swimming laps. Not everyone is ready to exercise. Sometimes we need to start with just getting active. Here are some easy ways to be more active throughout the day:  Take the stairs instead of the elevator  Go for a 10-15 minute walk during your lunch break (find a friend to make it more enjoyable)  When shopping,  park at the back of the parking lot  If you take public transportation, get off one stop early and walk the extra distance  Pace around while making phone calls  Check with your doctor if you arent sure what your limitations may be. Always remember to drink plenty of water when doing any type of exercise. Dont feel like a failure if youre not getting the 90-150 minutes per week. If you started by being a couch potato, then just a 10-minute walk each day is a huge improvement. Start with little victories and work your way up.   HEALTHY EATING TIPS  When looking to improve your eating habits, whether to lose weight, lower blood pressure or just be healthier, it helps to know what a serving size is.   Grains 1 slice of bread,  bagel,  cup pasta or rice  Vegetables 1 cup fresh or raw vegetables,  cup cooked or canned Fruits 1 piece of medium sized fruit,  cup canned,   Meats/Proteins  cup dried  1 oz meat, 1 egg,  cup cooked beans, nuts or seeds  Dairy        Fats Individual yogurt container, 1 cup (8oz)    1 teaspoon margarine/butter or vegetable  milk or milk alternative, 1 slice of cheese          oil; 1 tablespoon mayonnaise or salad dressing                  Plan ahead: make a menu of the meals for a week then create a grocery list to go with that menu. Consider meals that easily stretch into a night of leftovers, such as stews or casseroles. Or consider making two of your favorite meal and put one in the freezer for another night. Try a night or two each week that is meatless or no cook such as salads. When you get home from the grocery store wash and prepare your vegetables and fruits. Then when you need them they are ready to go.   Tips for going to the grocery store:  Buy store or generic brands  Check the weekly ad from your store on-line or in their in-store flyer  Look at the unit price on the shelf tag to compare/contrast the costs of different items  Buy  fruits/vegetables in season  Carrots, bananas and apples are low-cost, naturally healthy items  If meats or frozen vegetables are on sale, buy some extras and put in your freezer  Limit buying prepared or ready to eat items, even if they are pre-made salads or fruit snacks  Do not shop when youre hungry  Foods at eye level tend to be more expensive. Look on the high and low shelves for deals.  Consider shopping at the farmers market for fresh foods in season.  Avoid the cookie and chip aisles (these are expensive, high in calories and low in nutritional value). Shop on the outside of the grocery store.  Healthy food preparations:  If you cant get lean hamburger, be sure to drain the fat when cooking  Steam, saut (in olive oil), grill or bake foods  Experiment with different seasonings to avoid adding salt to your foods. Kosher salt, sea salt and Himalayan salt are all still salt and should be avoided. Try seasoning food with onion, garlic, thyme, rosemary, basil ect. Onion powder or garlic powder is ok. Avoid if it says salt (ie garlic salt).

## 2021-03-20 ENCOUNTER — Telehealth: Payer: Self-pay | Admitting: Pharmacist

## 2021-03-20 ENCOUNTER — Other Ambulatory Visit: Payer: Self-pay | Admitting: Physician Assistant

## 2021-03-20 NOTE — Telephone Encounter (Signed)
Pt returned call. She's given 4 doses of Ozempic 0.25mg , last dose given yesterday. Tolerating well so far, has noticed some weight loss. Has been stressed, likely getting divorced soon and mentioned potentially moving to [place]. I will call pt in 3 months once she's used up her extra Ozempic 0.5mg  doses to further increase her dose to 1mg  weekly. Pt was appreciative for the call. ?

## 2021-03-20 NOTE — Telephone Encounter (Signed)
Called pt and left message to follow up with Ozempic tolerability. If tolerating 0.25mg  well and once she's given 4 weekly injections, will be due to increase to 0.5mg  weekly. She has an additional 3 pens of this dose so will plan for her to stay on the 0.5mg  weekly dose for a few months to use up her current supply. ?

## 2021-04-08 ENCOUNTER — Telehealth: Payer: Self-pay | Admitting: Pharmacist

## 2021-04-08 NOTE — Telephone Encounter (Signed)
Pt called, moving to [place] next week. She already has MDs set up out there and is aware they will be in charge of future med refills. ?

## 2021-04-09 ENCOUNTER — Other Ambulatory Visit: Payer: Self-pay | Admitting: Nurse Practitioner

## 2021-04-09 DIAGNOSIS — E1165 Type 2 diabetes mellitus with hyperglycemia: Secondary | ICD-10-CM

## 2021-04-09 NOTE — Telephone Encounter (Signed)
Rx discontinued by a provider on 02/26/21 ?

## 2021-04-21 ENCOUNTER — Telehealth: Payer: Self-pay

## 2021-04-21 MED ORDER — OZEMPIC (1 MG/DOSE) 4 MG/3ML ~~LOC~~ SOPN: 1.0000 mg | PEN_INJECTOR | SUBCUTANEOUS | 1 refills | Status: DC

## 2021-04-21 NOTE — Telephone Encounter (Signed)
Spoke with patient. She is in [place]. [facility] called her about needing new rx due to pen size of ozempic. She has taken 3 shots of Ozempic 0.5mg . Doing well. She is due to increase to 1mg  when she runs out of current pen. Found [facility] close to her new house. Rx for Ozempic 1mg  weekly sent. ?

## 2021-04-25 ENCOUNTER — Telehealth: Payer: Self-pay | Admitting: Nurse Practitioner

## 2021-04-25 NOTE — Telephone Encounter (Signed)
[employer] called [phone] to confirm if this pt has diabetes or heart disease. I told her I couldn't give out that information. She needs to confirm to keep the pt enrolled in their program. Case # [id number] ?

## 2021-04-29 NOTE — Telephone Encounter (Signed)
Informed Lafonda Mosses from [facility] that patient is a diabetic.  ?

## 2021-05-09 ENCOUNTER — Telehealth: Payer: Self-pay | Admitting: Nurse Practitioner

## 2021-05-09 NOTE — Telephone Encounter (Signed)
Left message for patient to call back and schedule Medicare Annual Wellness Visit (AWV).  ? ?Please offer to do virtually or by telephone.  Left office number and my jabber #[phone]. ? ?Last AWV:05/07/2020 ? ?Please schedule at anytime with Nurse Health Advisor. ?  ?

## 2021-06-03 ENCOUNTER — Telehealth: Payer: Self-pay

## 2021-06-03 ENCOUNTER — Ambulatory Visit: Payer: Medicare Other

## 2021-06-03 NOTE — Telephone Encounter (Signed)
Called patient x3 left voice mail message to return to reschedule for the next available appointment.  L.Renesmay Nesbitt,LPN

## 2021-06-09 NOTE — Telephone Encounter (Signed)
I left message on patient's voice mail to call back and schedule AWV.

## 2021-06-10 ENCOUNTER — Telehealth: Payer: Self-pay | Admitting: Nurse Practitioner

## 2021-06-10 NOTE — Telephone Encounter (Signed)
I spoke to patient and she said she no longer lives in [place].  She said she left her husband and she won't be coming back to [place].  She said she's very happy. Please remove PCP.

## 2021-06-16 ENCOUNTER — Other Ambulatory Visit: Payer: Self-pay | Admitting: Physician Assistant

## 2021-06-16 ENCOUNTER — Other Ambulatory Visit: Payer: Self-pay | Admitting: Cardiovascular Disease

## 2021-06-16 DIAGNOSIS — I10 Essential (primary) hypertension: Secondary | ICD-10-CM

## 2021-06-21 ENCOUNTER — Other Ambulatory Visit: Payer: Self-pay | Admitting: Cardiovascular Disease

## 2021-07-04 ENCOUNTER — Other Ambulatory Visit: Payer: Self-pay | Admitting: Cardiovascular Disease

## 2021-07-17 ENCOUNTER — Other Ambulatory Visit: Payer: Self-pay

## 2021-07-17 DIAGNOSIS — I10 Essential (primary) hypertension: Secondary | ICD-10-CM

## 2021-07-17 MED ORDER — LISINOPRIL 20 MG PO TABS: 20.0000 mg | ORAL_TABLET | Freq: Every day | ORAL | 0 refills | Status: DC

## 2021-08-06 ENCOUNTER — Other Ambulatory Visit: Payer: Self-pay

## 2021-08-06 DIAGNOSIS — I10 Essential (primary) hypertension: Secondary | ICD-10-CM

## 2021-08-25 ENCOUNTER — Other Ambulatory Visit: Payer: Self-pay | Admitting: Cardiovascular Disease

## 2021-08-29 NOTE — Telephone Encounter (Signed)
Na

## 2021-11-24 IMAGING — MG DIGITAL DIAGNOSTIC BILAT W/ TOMO W/ CAD
8 of 11 series · 8 of 27 positions shown · non-contrast
Comparison: Previous exam(s).

CLINICAL DATA: Diagnostic mammogram report of 11/19/2017 described
probably benign calcifications in the LEFT breast for which a
follow-up diagnostic mammogram was recommended in 6 months. Patient
returns today for that follow-up diagnostic exam.

EXAM:
DIGITAL DIAGNOSTIC BILATERAL MAMMOGRAM WITH TOMOSYNTHESIS AND CAD;
ULTRASOUND RIGHT BREAST LIMITED
TECHNIQUE: Bilateral digital diagnostic mammography and breast tomosynthesis
was performed. The images were evaluated with computer-aided
detection.; Targeted ultrasound examination of the right breast was
performed

[L ML (1 of 2)]
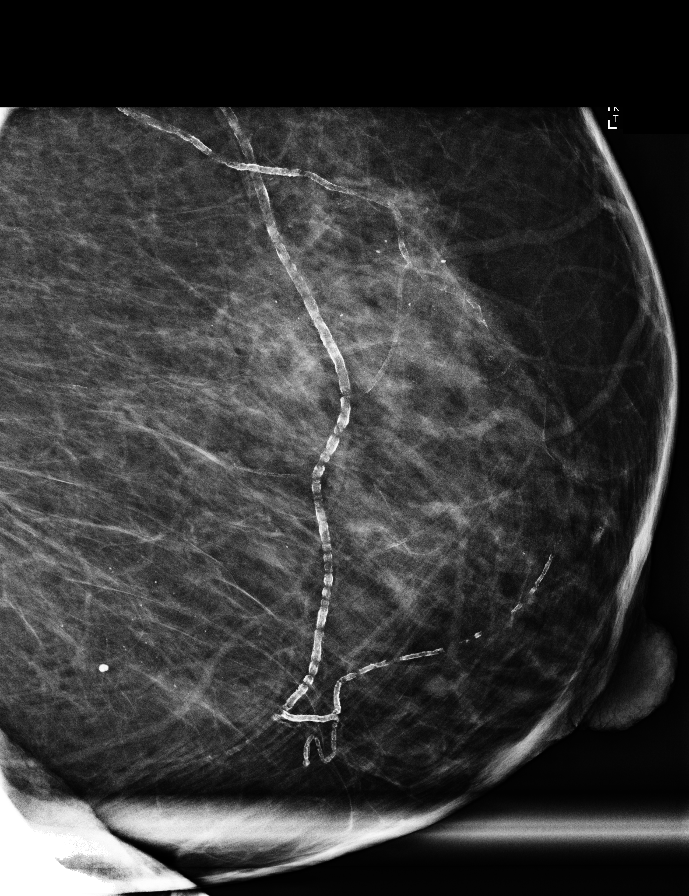

[L ML (2 of 2)]
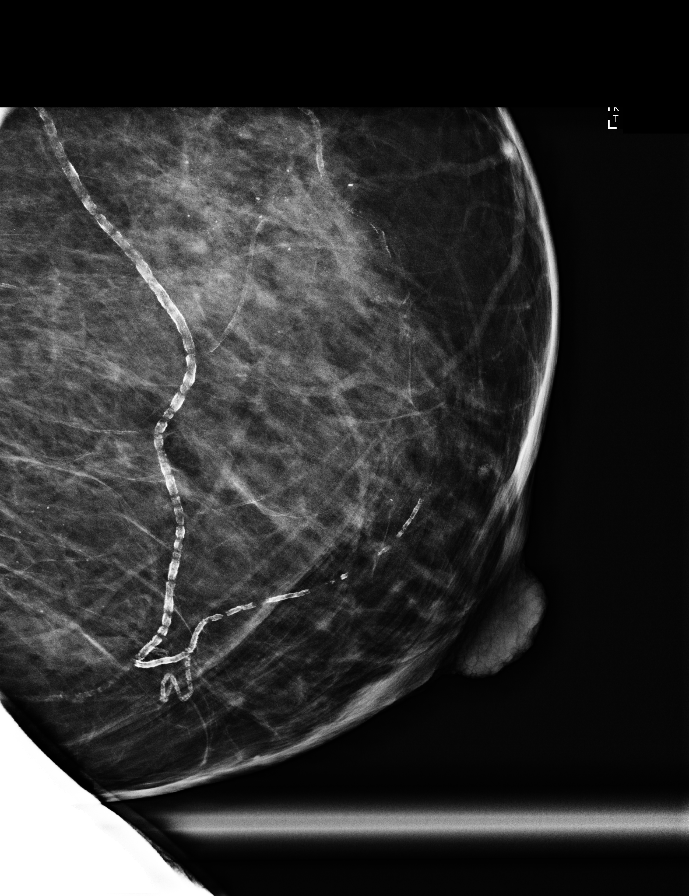

[L CC]
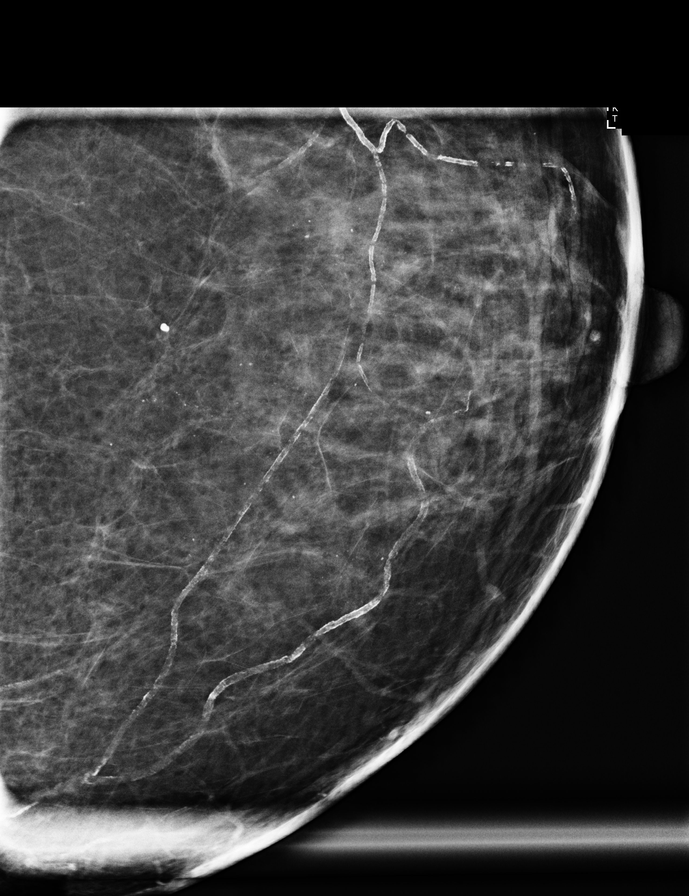

[R CC synth-2D]
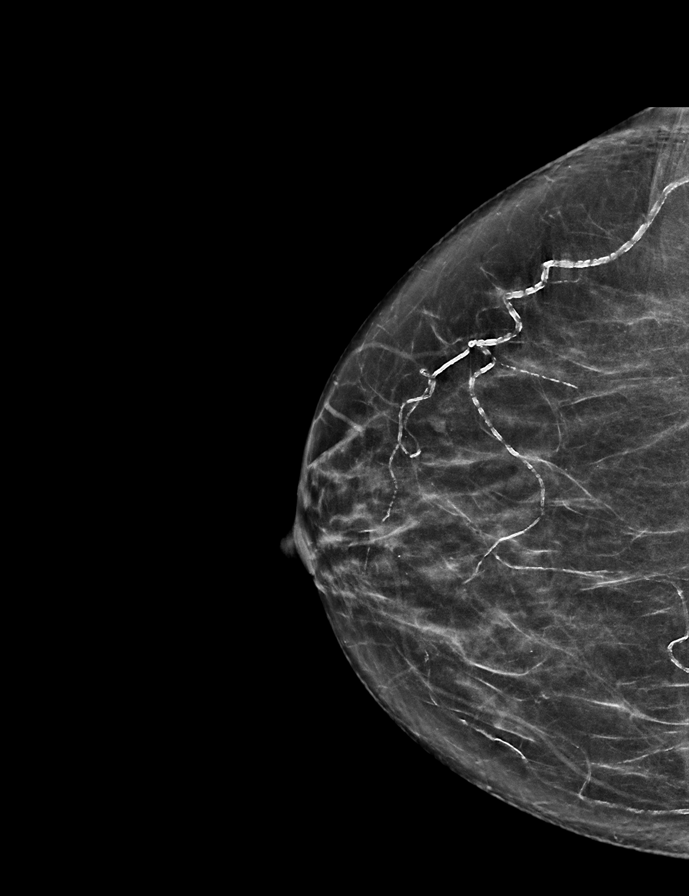

[L CC synth-2D]
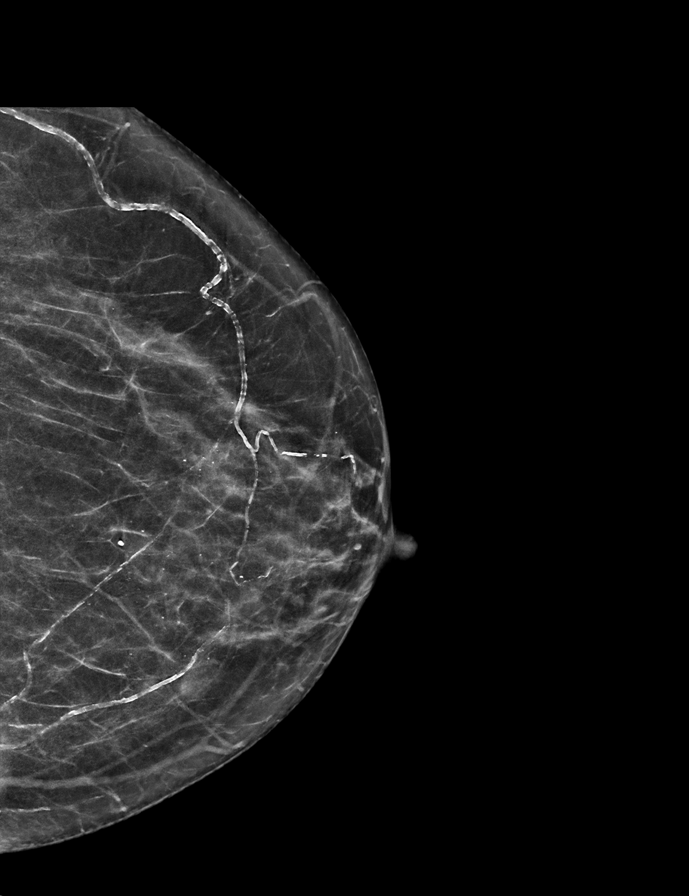

[R MLO synth-2D]
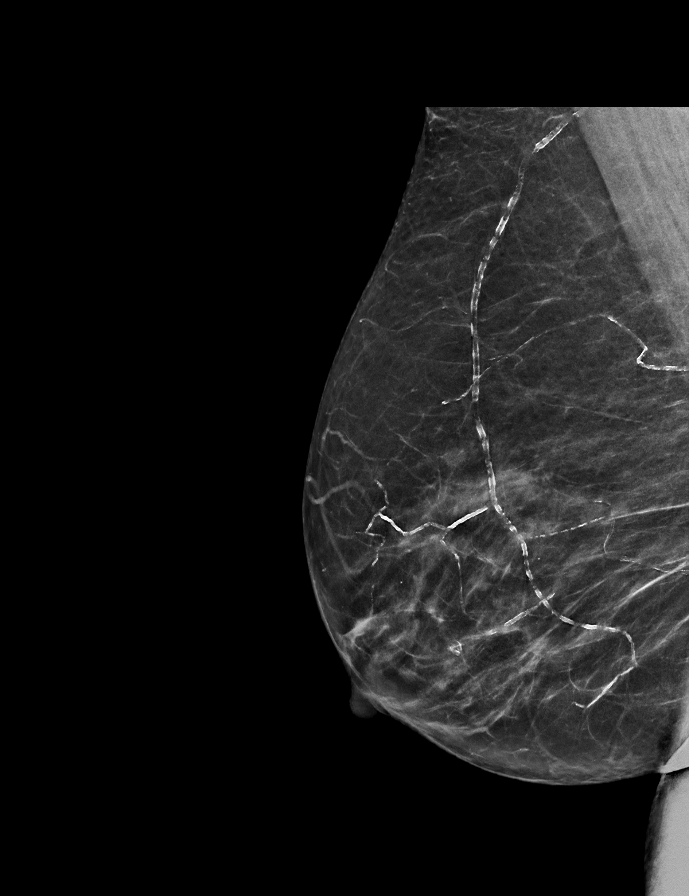

[L MLO synth-2D]
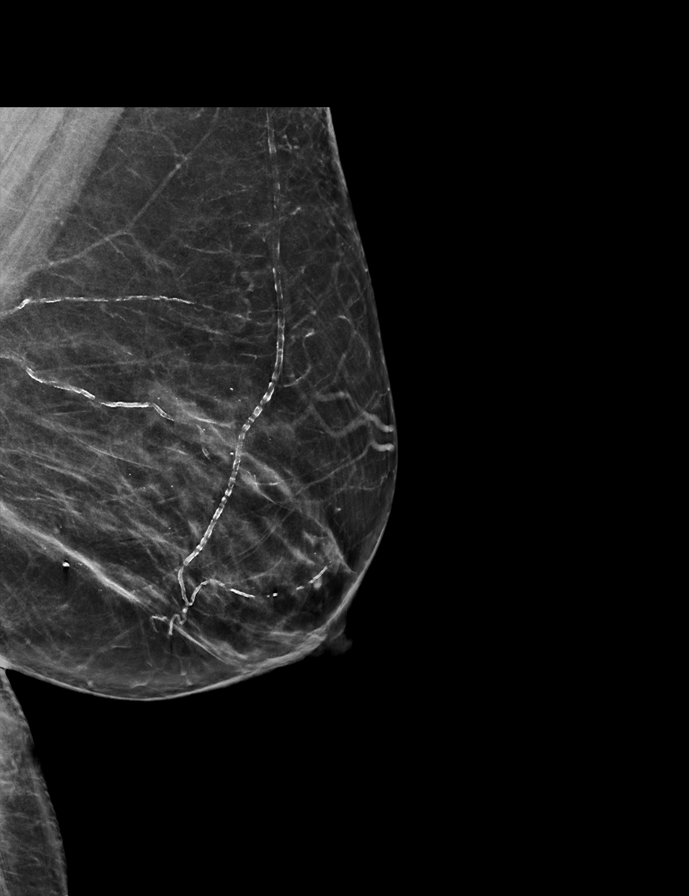

[R CC tomo · tomo slice 31/60.0]
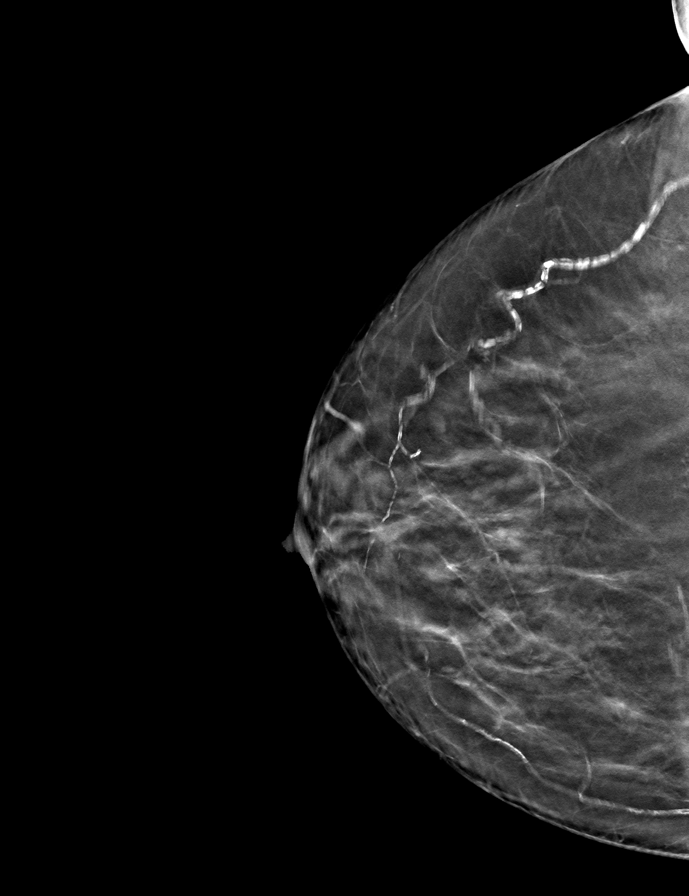

[8 of 27 positions shown; findings below may reference images not displayed]

ACR Breast Density Category b: There are scattered areas of
fibroglandular density.
FINDINGS: Bilateral diagnostic mammogram:

The LEFT breast calcifications are stable for greater than 2 years
confirming benignity. There are no new dominant masses, suspicious
calcifications or secondary signs of malignancy within the LEFT
breast.

There is a new oval circumscribed low-density mass within the outer
RIGHT breast, measuring approximately 5 mm greatest dimension.

Targeted ultrasound is performed, showing a benign cyst in the RIGHT
breast at the 8:30 o'clock axis, 1 cm from the nipple, measuring 5
mm, corresponding to the mammographic finding.
IMPRESSION: 1. No evidence of malignancy within either breast.
2. Benign calcifications in the LEFT breast, now shown to be stable
for greater than 2 years confirming benignity. No additional
follow-up diagnostic imaging is necessary for this benign finding.
3. Benign cyst in the RIGHT breast at the 8:30 o'clock axis.

Patient may return to routine annual bilateral screening mammogram
schedule.

RECOMMENDATION:
Screening mammogram in one year.(Code:[id number])

I have discussed the findings and recommendations with the patient.
If applicable, a reminder letter will be sent to the patient
regarding the next appointment.

BI-RADS CATEGORY  2: Benign.

## 2022-07-01 ENCOUNTER — Telehealth: Payer: Self-pay | Admitting: Nurse Practitioner

## 2022-07-01 ENCOUNTER — Ambulatory Visit: Payer: Medicare Other | Admitting: Nurse Practitioner

## 2022-07-01 NOTE — Telephone Encounter (Signed)
1st no show since 01/2021, letter sent, fee waived, text sent

## 2022-07-01 NOTE — Telephone Encounter (Signed)
6.26.24 no show letter sent 

## 2022-07-02 ENCOUNTER — Ambulatory Visit (INDEPENDENT_AMBULATORY_CARE_PROVIDER_SITE_OTHER): Payer: Medicare Other | Admitting: Nurse Practitioner

## 2022-07-02 ENCOUNTER — Encounter: Payer: Self-pay | Admitting: Nurse Practitioner

## 2022-07-02 ENCOUNTER — Other Ambulatory Visit: Payer: Self-pay

## 2022-07-02 VITALS — BP 140/80 | HR 93 | Temp 98.5°F | Resp 16 | Ht 63.0 in | Wt 164.0 lb

## 2022-07-02 DIAGNOSIS — M109 Gout, unspecified: Secondary | ICD-10-CM

## 2022-07-02 DIAGNOSIS — Z7985 Long-term (current) use of injectable non-insulin antidiabetic drugs: Secondary | ICD-10-CM | POA: Diagnosis not present

## 2022-07-02 DIAGNOSIS — I10 Essential (primary) hypertension: Secondary | ICD-10-CM

## 2022-07-02 DIAGNOSIS — E1169 Type 2 diabetes mellitus with other specified complication: Secondary | ICD-10-CM

## 2022-07-02 DIAGNOSIS — E785 Hyperlipidemia, unspecified: Secondary | ICD-10-CM

## 2022-07-02 DIAGNOSIS — E039 Hypothyroidism, unspecified: Secondary | ICD-10-CM | POA: Diagnosis not present

## 2022-07-02 LAB — HEMOGLOBIN A1C: Hgb A1c MFr Bld: 6 % (ref 4.6–6.5)

## 2022-07-02 LAB — URIC ACID: Uric Acid, Serum: 8.1 mg/dL — ABNORMAL HIGH (ref 2.4–7.0)

## 2022-07-02 LAB — LIPID PANEL
Cholesterol: 216 mg/dL — ABNORMAL HIGH (ref 0–200)
HDL: 90.9 mg/dL (ref >39.00)
LDL Cholesterol: 88 mg/dL (ref 0–99)
NonHDL: 125.1
Total CHOL/HDL Ratio: 2
Triglycerides: 187 mg/dL — ABNORMAL HIGH (ref 0.0–149.0)
VLDL: 37.4 mg/dL (ref 0.0–40.0)

## 2022-07-02 MED ORDER — ALLOPURINOL 100 MG PO TABS
100.0000 mg | ORAL_TABLET | Freq: Every day | ORAL | 6 refills | Status: DC
Start: 2022-07-02 — End: 2023-02-09

## 2022-07-02 MED ORDER — OZEMPIC (1 MG/DOSE) 4 MG/3ML ~~LOC~~ SOPN
0.2500 mg | PEN_INJECTOR | SUBCUTANEOUS | 0 refills | Status: DC
Start: 2022-07-02 — End: 2022-07-03

## 2022-07-02 NOTE — Progress Notes (Signed)
Established Patient Visit  Patient: Diamond Hughes   DOB: 1950-07-27   72 y.o. Female  MRN: [id number] Visit Date: 07/02/2022  Subjective:    Chief Complaint  Patient presents with   Diabetes    She has not take any medications for her diabetes for a couple of months.  She feels like she's leaning when she walks.    HPI Acquired hypothyroidism Repeat TSH and T4:  maintain levothyroxine dose.   Essential hypertension Elevated BP today Reports home BP: 130s/70s Reports compliance to med doses (amlodipine, metoprolol, and lisinopril) and DASH diet BP Readings from Last 3 Encounters:  07/02/22 (!) 140/80  02/04/21 138/84  10/22/20 130/70    Maintain med doses Repeat CMP F/up in 3months  Gouty arthritis of right great toe Chronic right great toe pain, no joint effusion or erythema noted today. Last uric acid level at 7.8 Repeat uric acid and CMP today  Hyperlipidemia associated with type 2 diabetes mellitus (HCC) Repeat lipid panel Maintain lipitor and zetia dose at this time  Type 2 diabetes mellitus with hyperglycemia, without long-term current use of insulin (HCC) Controlled with ozempic 5mg , but out of med x 2months Last hgbA1c at 6.4% No glucose check at home Up to ate with Dm eye exam, report requested Normal foot exam today.  Repeat hgbA1c, CMP, UACr and lipid panel today Stopped ozempic 5mg  2months ago. Last eye exam in [place].  Wt Readings from Last 3 Encounters:  07/02/22 164 lb (74.4 kg)  02/26/21 179 lb (81.2 kg)  02/04/21 179 lb 12.8 oz (81.6 kg)    Reviewed medical, surgical, and social history today  Medications: Outpatient Medications Prior to Visit  Medication Sig   amLODipine (NORVASC) 5 MG tablet TAKE 1 TABLET(5 MG) BY MOUTH DAILY   aspirin 81 MG tablet Take 1 tablet (81 mg total) by mouth daily.   atorvastatin (LIPITOR) 80 MG tablet Take 1 tablet (80 mg total) by mouth daily at 6 PM.   clopidogrel (PLAVIX) 75 MG tablet  TAKE 1 TABLET BY MOUTH EVERY DAY WITH BREAKFAST   clotrimazole-betamethasone (LOTRISONE) cream Apply 1 application topically 2 (two) times daily.   co-enzyme Q-10 30 MG capsule Take 30 mg by mouth 3 (three) times daily.   ezetimibe (ZETIA) 10 MG tablet Take 1 tablet (10 mg total) by mouth daily.   indomethacin (INDOCIN) 25 MG capsule Take 1 capsule (25 mg total) by mouth 2 (two) times daily as needed. With food   KRILL OIL PO Take by mouth.   levothyroxine (SYNTHROID) 75 MCG tablet TAKE 1 TABLET(75 MCG) BY MOUTH DAILY BEFORE BREAKFAST   lisinopril (ZESTRIL) 20 MG tablet Take 1 tablet (20 mg total) by mouth daily.   metoprolol tartrate (LOPRESSOR) 50 MG tablet TAKE 1 TABLET(50 MG) BY MOUTH TWICE DAILY   nitroGLYCERIN (NITROSTAT) 0.4 MG SL tablet Place 1 tablet (0.4 mg total) under the tongue every 5 (five) minutes x 3 doses as needed for chest pain.   VITAMIN D PO Take by mouth.   OZEMPIC, 1 MG/DOSE, 4 MG/3ML SOPN INJECT 1MG  UNDER THE SKIN ONCE A WEEK (Patient not taking: Reported on 07/02/2022)   No facility-administered medications prior to visit.   Reviewed past medical and social history.   ROS per HPI above      Objective:  BP (!) 140/80   Pulse 93   Temp 98.5 F (36.9 C) (Temporal)   Resp 16  Ht 5\' 3"  (1.6 m)   Wt 164 lb (74.4 kg)   SpO2 93%   BMI 29.05 kg/m      Physical Exam Cardiovascular:     Rate and Rhythm: Normal rate and regular rhythm.     Pulses: Normal pulses.     Heart sounds: Normal heart sounds.  Pulmonary:     Effort: Pulmonary effort is normal.     Breath sounds: Normal breath sounds.  Musculoskeletal:     Right lower leg: No edema.     Left lower leg: No edema.  Neurological:     Mental Status: She is alert and oriented to person, place, and time.     No results found for any visits on 07/02/22.    Assessment & Plan:    Problem List Items Addressed This Visit       Cardiovascular and Mediastinum   Essential hypertension    Elevated BP  today Reports home BP: 130s/70s Reports compliance to med doses (amlodipine, metoprolol, and lisinopril) and DASH diet BP Readings from Last 3 Encounters:  07/02/22 (!) 140/80  02/04/21 138/84  10/22/20 130/70    Maintain med doses Repeat CMP F/up in 3months      Relevant Orders   Comprehensive metabolic panel with eGFR ([facility])     Endocrine   Acquired hypothyroidism - Primary    Repeat TSH and T4:  maintain levothyroxine dose.       Hyperlipidemia associated with type 2 diabetes mellitus (HCC)    Repeat lipid panel Maintain lipitor and zetia dose at this time      Relevant Orders   Comprehensive metabolic panel with eGFR ([facility])   Lipid panel   Type 2 diabetes mellitus with hyperglycemia, without long-term current use of insulin (HCC)    Controlled with ozempic 5mg , but out of med x 2months Last hgbA1c at 6.4% No glucose check at home Up to ate with Dm eye exam, report requested Normal foot exam today.  Repeat hgbA1c, CMP, UACr and lipid panel today      Relevant Orders   Urine microalbumin-creatinine with uACR   Comprehensive metabolic panel with eGFR ([facility])   Hemoglobin A1c     Musculoskeletal and Integument   Gouty arthritis of right great toe    Chronic right great toe pain, no joint effusion or erythema noted today. Last uric acid level at 7.8 Repeat uric acid and CMP today      Relevant Orders   Uric acid   Return in about 3 months (around 10/02/2022) for HTN, DM, Hypothyroidism, hyperlipidemia (fasting).     Alysia Penna, NP

## 2022-07-02 NOTE — Assessment & Plan Note (Addendum)
Repeat lipid panel: elevated triglyceride and total cholesterol. Maintain low carb/low fat diet. Maintain lipitor and zetia dose at this time

## 2022-07-02 NOTE — Addendum Note (Signed)
Addended by: Alysia Penna L on: 07/02/2022 12:55 PM   Modules accepted: Orders

## 2022-07-02 NOTE — Assessment & Plan Note (Signed)
Elevated BP today Reports home BP: 130s/70s Reports compliance to med doses (amlodipine, metoprolol, and lisinopril) and DASH diet BP Readings from Last 3 Encounters:  07/02/22 (!) 140/80  02/04/21 138/84  10/22/20 130/70    Maintain med doses Repeat CMP F/up in 3months

## 2022-07-02 NOTE — Assessment & Plan Note (Signed)
Repeat TSH and T4 °maintain levothyroxine dose °

## 2022-07-02 NOTE — Patient Instructions (Signed)
Sign medical release to get records from ophthalmology in [place]. Go to lab Get zoster and tadp vaccine from retail pharmacy

## 2022-07-02 NOTE — Assessment & Plan Note (Addendum)
Chronic right great toe pain, no joint effusion or erythema noted today. Last uric acid level at 7.8 Repeat uric acid and CMP today:  Continuous elevated uric acid 8.1.  start allopurinol 100mg  daily Repeat in 3months

## 2022-07-02 NOTE — Assessment & Plan Note (Addendum)
Controlled with ozempic 5mg , but out of med x 2months Last hgbA1c at 6.4% No glucose check at home Up to ate with Dm eye exam, report requested Normal foot exam today.  Repeat hgbA1c, CMP, UACr and lipid panel today Controlled DM with hgbA1c at 6.0%: resume ozempic 0.25mg  weekly Maintain lipitor and zetia F/up in 3months

## 2022-07-03 ENCOUNTER — Telehealth: Payer: Self-pay | Admitting: Nurse Practitioner

## 2022-07-03 ENCOUNTER — Other Ambulatory Visit: Payer: Self-pay

## 2022-07-03 ENCOUNTER — Other Ambulatory Visit: Payer: Self-pay | Admitting: Nurse Practitioner

## 2022-07-03 DIAGNOSIS — I2510 Atherosclerotic heart disease of native coronary artery without angina pectoris: Secondary | ICD-10-CM

## 2022-07-03 DIAGNOSIS — E1169 Type 2 diabetes mellitus with other specified complication: Secondary | ICD-10-CM

## 2022-07-03 DIAGNOSIS — E1165 Type 2 diabetes mellitus with hyperglycemia: Secondary | ICD-10-CM

## 2022-07-03 LAB — COMPLETE METABOLIC PANEL WITH GFR
AG Ratio: 1.5 (calc) (ref 1.0–2.5)
ALT: 19 U/L (ref 6–29)
AST: 28 U/L (ref 10–35)
Albumin: 4.7 g/dL (ref 3.6–5.1)
Alkaline phosphatase (APISO): 55 U/L (ref 37–153)
BUN: 22 mg/dL (ref 7–25)
CO2: 24 mmol/L (ref 20–32)
Calcium: 9.8 mg/dL (ref 8.6–10.4)
Chloride: 102 mmol/L (ref 98–110)
Creat: 0.6 mg/dL (ref 0.60–1.00)
Globulin: 3.1 g/dL (calc) (ref 1.9–3.7)
Glucose, Bld: 95 mg/dL (ref 65–99)
Potassium: 4.4 mmol/L (ref 3.5–5.3)
Sodium: 140 mmol/L (ref 135–146)
Total Bilirubin: 0.3 mg/dL (ref 0.2–1.2)
Total Protein: 7.8 g/dL (ref 6.1–8.1)
eGFR: 97 mL/min/{1.73_m2} (ref >=60)

## 2022-07-03 LAB — MICROALBUMIN / CREATININE URINE RATIO
Creatinine,U: 53 mg/dL
Microalb Creat Ratio: 27.3 mg/g (ref 0.0–30.0)
Microalb, Ur: 14.5 mg/dL — ABNORMAL HIGH (ref 0.0–1.9)

## 2022-07-03 MED ORDER — OZEMPIC (0.25 OR 0.5 MG/DOSE) 2 MG/3ML ~~LOC~~ SOPN
0.2500 mg | PEN_INJECTOR | SUBCUTANEOUS | 0 refills | Status: DC
Start: 2022-07-03 — End: 2022-07-03

## 2022-07-03 MED ORDER — OZEMPIC (0.25 OR 0.5 MG/DOSE) 2 MG/3ML ~~LOC~~ SOPN
0.2500 mg | PEN_INJECTOR | SUBCUTANEOUS | 0 refills | Status: DC
Start: 2022-07-03 — End: 2022-07-06

## 2022-07-03 NOTE — Telephone Encounter (Signed)
Pt was her to drop off urine sample. But patient said that the ozempic you prescribe for her was a 1mg  and it was suppose to be a 2.5. so the pharmacy told the patient that you would have to rewrite the prescription

## 2022-07-03 NOTE — Telephone Encounter (Signed)
Called pharmacy and they wanted [place] to resend with the starter dosage

## 2022-07-03 NOTE — Telephone Encounter (Signed)
error 

## 2022-07-03 NOTE — Progress Notes (Signed)
Stable Follow instructions as discussed during office visit.

## 2022-07-06 ENCOUNTER — Other Ambulatory Visit: Payer: Self-pay | Admitting: Family

## 2022-07-06 ENCOUNTER — Telehealth: Payer: Self-pay | Admitting: Nurse Practitioner

## 2022-07-06 MED ORDER — METFORMIN HCL 500 MG PO TABS
250.0000 mg | ORAL_TABLET | Freq: Every day | ORAL | 3 refills | Status: DC
Start: 2022-07-06 — End: 2023-11-10

## 2022-07-06 NOTE — Telephone Encounter (Signed)
Pt would like to be put back on Metformin, she no longer wants to take Semaglutide,0.25 or 0.5MG /DOS, (OZEMPIC, 0.25 OR 0.5 MG/DOSE,) 2 MG/3ML SOPN [[id number]. She can no longer afford this script.  [id number] [place], [place] - [address] AT [facility] [address] [place] [address] Phone: [phone]  Fax: [phone] DEA #: [id number]  Pt at [phone]

## 2022-07-07 NOTE — Telephone Encounter (Signed)
Called patient and made her aware of changes.  

## 2022-07-13 ENCOUNTER — Other Ambulatory Visit (INDEPENDENT_AMBULATORY_CARE_PROVIDER_SITE_OTHER): Payer: Medicare Other

## 2022-07-13 DIAGNOSIS — E039 Hypothyroidism, unspecified: Secondary | ICD-10-CM

## 2022-07-13 LAB — T4, FREE: Free T4: 0.68 ng/dL (ref 0.60–1.60)

## 2022-07-13 LAB — TSH: TSH: 5.04 u[IU]/mL (ref 0.35–5.50)

## 2022-08-24 ENCOUNTER — Ambulatory Visit (INDEPENDENT_AMBULATORY_CARE_PROVIDER_SITE_OTHER): Payer: Medicare Other

## 2022-08-24 VITALS — BP 130/70 | HR 76 | Temp 98.1°F | Ht 63.5 in | Wt 175.8 lb

## 2022-08-24 DIAGNOSIS — Z0000 Encounter for general adult medical examination without abnormal findings: Secondary | ICD-10-CM

## 2022-08-24 DIAGNOSIS — Z1231 Encounter for screening mammogram for malignant neoplasm of breast: Secondary | ICD-10-CM

## 2022-08-24 NOTE — Addendum Note (Signed)
Addended by: Larey Dresser on: 08/24/2022 10:06 AM   Modules accepted: Orders

## 2022-08-24 NOTE — Progress Notes (Signed)
Subjective:   Diamond Hughes is a 72 y.o. female who presents for Medicare Annual (Subsequent) preventive examination.  Visit Complete: In person    Review of Systems     Cardiac Risk Factors include: advanced age (>43men, >33 women);diabetes mellitus;dyslipidemia;hypertension;obesity (BMI >30kg/m2)     Objective:    Today's Vitals   08/24/22 0909  BP: 130/70  Pulse: 76  Temp: 98.1 F (36.7 C)  TempSrc: Oral  SpO2: 96%  Weight: 175 lb 12.8 oz (79.7 kg)  Height: 5' 3.5" (1.613 m)   Body mass index is 30.65 kg/m.     08/24/2022    9:22 AM 05/07/2020    9:01 AM 01/25/2019    8:52 AM 02/13/2016    9:40 AM 02/08/2016   10:31 AM 02/02/2016    9:51 PM  Advanced Directives  Does Patient Have a Medical Advance Directive? Yes No No No No No  Does patient want to make changes to medical advance directive? Yes (MAU/Ambulatory/Procedural Areas - Information given)       Would patient like information on creating a medical advance directive?  No - Patient declined No - Patient declined No - Patient declined No - Patient declined     Current Medications (verified) Outpatient Encounter Medications as of 08/24/2022  Medication Sig   allopurinol (ZYLOPRIM) 100 MG tablet Take 1 tablet (100 mg total) by mouth daily.   amLODipine (NORVASC) 5 MG tablet TAKE 1 TABLET(5 MG) BY MOUTH DAILY   aspirin 81 MG tablet Take 1 tablet (81 mg total) by mouth daily.   atorvastatin (LIPITOR) 80 MG tablet Take 1 tablet (80 mg total) by mouth daily at 6 PM.   clopidogrel (PLAVIX) 75 MG tablet TAKE 1 TABLET BY MOUTH EVERY DAY WITH BREAKFAST   co-enzyme Q-10 30 MG capsule Take 30 mg by mouth 3 (three) times daily.   ezetimibe (ZETIA) 10 MG tablet Take 1 tablet (10 mg total) by mouth daily.   levothyroxine (SYNTHROID) 75 MCG tablet TAKE 1 TABLET(75 MCG) BY MOUTH DAILY BEFORE BREAKFAST   lisinopril (ZESTRIL) 20 MG tablet Take 1 tablet (20 mg total) by mouth daily.   metFORMIN (GLUCOPHAGE) 500 MG tablet Take  0.5 tablets (250 mg total) by mouth daily with breakfast.   metoprolol tartrate (LOPRESSOR) 50 MG tablet TAKE 1 TABLET(50 MG) BY MOUTH TWICE DAILY   Misc Natural Products (BEET ROOT PO) Take by mouth. Powder mixed with water   nitroGLYCERIN (NITROSTAT) 0.4 MG SL tablet Place 1 tablet (0.4 mg total) under the tongue every 5 (five) minutes x 3 doses as needed for chest pain.   Omega-3 Fatty Acids (OMEGA-3 PO) Take 1 capsule by mouth daily.   VITAMIN D PO Take by mouth.   clotrimazole-betamethasone (LOTRISONE) cream Apply 1 application topically 2 (two) times daily. (Patient not taking: Reported on 08/24/2022)   indomethacin (INDOCIN) 25 MG capsule Take 1 capsule (25 mg total) by mouth 2 (two) times daily as needed. With food (Patient not taking: Reported on 08/24/2022)   KRILL OIL PO Take by mouth. (Patient not taking: Reported on 08/24/2022)   No facility-administered encounter medications on file as of 08/24/2022.    Allergies (verified) Patient has no known allergies.   History: Past Medical History:  Diagnosis Date   CAD (coronary artery disease)    a. 2000 s/p MI and RCA PCI [facility]);  b. 02/2016 NSTEMI/PCI: LM nl, LAD nl, LCX 100p (3.5x18 Resolute Onyx DES), OM1/3 small, RCA 70p aneurysmal, 38m/d ISR-->attempted PCI, could  not cross w/ wire-->med rx, EF 45-50%.   Hyperlipidemia    Hypertension    Ischemic cardiomyopathy    a. 02/2016 Echo: EF 45-50%, basal-mid inferior/posterior HK, Gr1 DD, triv MR, nl LA size, nl RV fxn.   Normocytic anemia    Past Surgical History:  Procedure Laterality Date   ANGIOPLASTY  2000   CORONARY STENT INTERVENTION N/A 02/10/2016   Procedure: Coronary Stent Intervention;  Surgeon: Lyn Records, MD;  Location: [facility] INVASIVE CV LAB;  Service: Cardiovascular;  Laterality: N/A;   LEFT HEART CATH AND CORONARY ANGIOGRAPHY N/A 02/10/2016   Procedure: Left Heart Cath and Coronary Angiography;  Surgeon: Lyn Records, MD;  Location: [facility] INVASIVE CV LAB;  Service:  Cardiovascular;  Laterality: N/A;   Family History  Problem Relation Age of Onset   Heart disease Mother    Hyperlipidemia Mother    Hypertension Mother    Hyperlipidemia Sister    Hypertension Sister    Breast cancer Maternal Grandmother    Social History   Socioeconomic History   Marital status: Married    Spouse name: Not on file   Number of children: Not on file   Years of education: Not on file   Highest education level: Not on file  Occupational History   Occupation: retired  Tobacco Use   Smoking status: Former    Current packs/day: 0.20    Average packs/day: 0.2 packs/day for 15.0 years (3.0 ttl pk-yrs)    Types: Cigarettes   Smokeless tobacco: Never  Vaping Use   Vaping status: Never Used  Substance and Sexual Activity   Alcohol use: Yes    Comment: 40oz beer   Drug use: No   Sexual activity: Yes    Birth control/protection: None  Other Topics Concern   Not on file  Social History Narrative   Not on file   Social Determinants of Health   Financial Resource Strain: Low Risk  (08/24/2022)   Overall Financial Resource Strain (CARDIA)    Difficulty of Paying Living Expenses: Not hard at all  Food Insecurity: No Food Insecurity (08/24/2022)   Hunger Vital Sign    Worried About Running Out of Food in the Last Year: Never true    Ran Out of Food in the Last Year: Never true  Transportation Needs: No Transportation Needs (08/24/2022)   PRAPARE - Administrator, Civil Service (Medical): No    Lack of Transportation (Non-Medical): No  Physical Activity: Inactive (08/24/2022)   Exercise Vital Sign    Days of Exercise per Week: 0 days    Minutes of Exercise per Session: 0 min  Stress: No Stress Concern Present (08/24/2022)   [employer] of Occupational Health - Occupational Stress Questionnaire    Feeling of Stress : Not at all  Social Connections: Moderately Isolated (08/24/2022)   Social Connection and Isolation Panel [NHANES]    Frequency of  Communication with Friends and Family: Once a week    Frequency of Social Gatherings with Friends and Family: Never    Attends Religious Services: More than 4 times per year    Active Member of [employer] or Organizations: No    Attends Engineer, structural: Never    Marital Status: Married    Tobacco Counseling Counseling given: Not Answered   Clinical Intake:  Pre-visit preparation completed: Yes  Pain : No/denies pain     Nutritional Status: BMI > 30  Obese Nutritional Risks: None Diabetes: Yes CBG done?: No Did pt.  bring in CBG monitor from home?: No  How often do you need to have someone help you when you read instructions, pamphlets, or other written materials from your doctor or pharmacy?: 1 - Never  Interpreter Needed?: No  Information entered by :: NAllen LPN   Activities of Daily Living    08/24/2022    9:12 AM  In your present state of health, do you have any difficulty performing the following activities:  Hearing? 0  Vision? 0  Difficulty concentrating or making decisions? 0  Walking or climbing stairs? 0  Dressing or bathing? 0  Doing errands, shopping? 0  Preparing Food and eating ? N  Using the Toilet? N  In the past six months, have you accidently leaked urine? N  Do you have problems with loss of bowel control? N  Managing your Medications? N  Managing your Finances? N  Housekeeping or managing your Housekeeping? N    Patient Care Team: Nche, Bonna Gains, NP as PCP - General (Internal Medicine) Kathleene Hazel, MD as PCP - Cardiology (Cardiology)  Indicate any recent Medical Services you may have received from other than [facility] providers in the past year (date may be approximate).     Assessment:   This is a routine wellness examination for Makeila.  Hearing/Vision screen Hearing Screening - Comments:: Denies hearing issues Vision Screening - Comments:: Regular eye exams, [facility]  Dietary issues and exercise activities  discussed:     Goals Addressed             This Visit's Progress    Patient Stated       08/24/2022, going to start going back to the gym       Depression Screen    08/24/2022    9:27 AM 07/02/2022   10:24 AM 05/07/2020    9:11 AM 03/13/2020    1:44 PM 06/14/2019    8:38 AM 01/25/2019    9:02 AM 03/18/2018   10:54 AM  PHQ 2/9 Scores  PHQ - 2 Score 0 0 1 0 0 0 0  PHQ- 9 Score 0          Fall Risk    08/24/2022    9:26 AM 07/02/2022   10:24 AM 10/22/2020   11:05 AM 05/07/2020    9:10 AM 06/14/2019    8:37 AM  Fall Risk   Falls in the past year? 0 0 0 0 0  Number falls in past yr: 0 0 0 0 0  Injury with Fall? 0 0 0 0 0  Risk for fall due to : Medication side effect No Fall Risks No Fall Risks    Follow up Falls prevention discussed;Falls evaluation completed Falls evaluation completed Falls evaluation completed Falls prevention discussed     MEDICARE RISK AT HOME: Medicare Risk at Home Any stairs in or around the home?: Yes If so, are there any without handrails?: No Home free of loose throw rugs in walkways, pet beds, electrical cords, etc?: Yes Adequate lighting in your home to reduce risk of falls?: Yes Life alert?: No Use of a cane, walker or w/c?: No Grab bars in the bathroom?: No Shower chair or bench in shower?: No Elevated toilet seat or a handicapped toilet?: No  TIMED UP AND GO:  Was the test performed?  Yes  Length of time to ambulate 10 feet: 5 sec Gait steady and fast without use of assistive device    Cognitive Function:  08/24/2022    9:30 AM  6CIT Screen  What Year? 0 points  What month? 0 points  What time? 0 points  Count back from 20 0 points  Months in reverse 0 points  Repeat phrase 2 points  Total Score 2 points    Immunizations Immunization History  Administered Date(s) Administered   PFIZER(Purple Top)SARS-COV-2 Vaccination 01/23/2019, 03/04/2019, 12/04/2019    TDAP status: Due, Education has been provided regarding the  importance of this vaccine. Advised may receive this vaccine at local pharmacy or Health Dept. Aware to provide a copy of the vaccination record if obtained from local pharmacy or Health Dept. Verbalized acceptance and understanding.  Flu Vaccine status: Declined, Education has been provided regarding the importance of this vaccine but patient still declined. Advised may receive this vaccine at local pharmacy or Health Dept. Aware to provide a copy of the vaccination record if obtained from local pharmacy or Health Dept. Verbalized acceptance and understanding.  Pneumococcal vaccine status: Declined,  Education has been provided regarding the importance of this vaccine but patient still declined. Advised may receive this vaccine at local pharmacy or Health Dept. Aware to provide a copy of the vaccination record if obtained from local pharmacy or Health Dept. Verbalized acceptance and understanding.   Covid-19 vaccine status: Information provided on how to obtain vaccines.   Qualifies for Shingles Vaccine? Yes   Zostavax completed No   Shingrix Completed?: No.    Education has been provided regarding the importance of this vaccine. Patient has been advised to call insurance company to determine out of pocket expense if they have not yet received this vaccine. Advised may also receive vaccine at local pharmacy or Health Dept. Verbalized acceptance and understanding.  Screening Tests Health Maintenance  Topic Date Due   OPHTHALMOLOGY EXAM  Never done   DTaP/Tdap/Td (1 - Tdap) Never done   Zoster Vaccines- Shingrix (1 of 2) Never done   MAMMOGRAM  07/10/2021   INFLUENZA VACCINE  08/06/2022   COVID-19 Vaccine (4 - 2023-24 season) 09/09/2022 (Originally 09/05/2021)   Pneumonia Vaccine 44+ Years old (1 of 1 - PCV) 08/24/2023 (Originally 05/02/2017)   HEMOGLOBIN A1C  01/01/2023   Diabetic kidney evaluation - eGFR measurement  07/02/2023   FOOT EXAM  07/02/2023   Diabetic kidney evaluation - Urine ACR   07/03/2023   Medicare Annual Wellness (AWV)  08/24/2023   Fecal DNA (Cologuard)  11/21/2023   DEXA SCAN  Completed   Hepatitis C Screening  Completed   HPV VACCINES  Aged Out   Colonoscopy  Discontinued    Health Maintenance  Health Maintenance Due  Topic Date Due   OPHTHALMOLOGY EXAM  Never done   DTaP/Tdap/Td (1 - Tdap) Never done   Zoster Vaccines- Shingrix (1 of 2) Never done   MAMMOGRAM  07/10/2021   INFLUENZA VACCINE  08/06/2022    Colorectal cancer screening: Type of screening: Cologuard. Completed 11/20/2021. Repeat every 3 years  Mammogram status: Completed 07/10/2020. Repeat every year  Bone Density status: Completed 07/18/2019.   Lung Cancer Screening: (Low Dose CT Chest recommended if Age 36-80 years, 20 pack-year currently smoking OR have quit w/in 15years.) does not qualify.   Lung Cancer Screening Referral: no  Additional Screening:  Hepatitis C Screening: does qualify; Completed 08/09/2017  Vision Screening: Recommended annual ophthalmology exams for early detection of glaucoma and other disorders of the eye. Is the patient up to date with their annual eye exam?  No  Who is the provider or  what is the name of the office in which the patient attends annual eye exams? [facility] If pt is not established with a provider, would they like to be referred to a provider to establish care? No .   Dental Screening: Recommended annual dental exams for proper oral hygiene  Diabetic Foot Exam: Diabetic Foot Exam: Completed 07/02/2022  Community Resource Referral / Chronic Care Management: CRR required this visit?  No   CCM required this visit?  No     Plan:     I have personally reviewed and noted the following in the patient's chart:   Medical and social history Use of alcohol, tobacco or illicit drugs  Current medications and supplements including opioid prescriptions. Patient is not currently taking opioid prescriptions. Functional ability and  status Nutritional status Physical activity Advanced directives List of other physicians Hospitalizations, surgeries, and ER visits in previous 12 months Vitals Screenings to include cognitive, depression, and falls Referrals and appointments  In addition, I have reviewed and discussed with patient certain preventive protocols, quality metrics, and best practice recommendations. A written personalized care plan for preventive services as well as general preventive health recommendations were provided to patient.     Barb Merino, LPN   [id number]/61/[id number]   After Visit Summary: in person  Nurse Notes: would like mobile mammogram

## 2022-08-24 NOTE — Patient Instructions (Signed)
[name] , Thank you for taking time to come for your Medicare Wellness Visit. I appreciate your ongoing commitment to your health goals. Please review the following plan we discussed and let me know if I can assist you in the future.   Referrals/Orders/Follow-Ups/Clinician Recommendations: none  This is a list of the screening recommended for you and due dates:  Health Maintenance  Topic Date Due   Eye exam for diabetics  Never done   DTaP/Tdap/Td vaccine (1 - Tdap) Never done   Zoster (Shingles) Vaccine (1 of 2) Never done   Mammogram  07/10/2021   Flu Shot  08/06/2022   COVID-19 Vaccine (4 - 2023-24 season) 09/09/2022*   Pneumonia Vaccine (1 of 1 - PCV) 08/24/2023*   Hemoglobin A1C  01/01/2023   Yearly kidney function blood test for diabetes  07/02/2023   Complete foot exam   07/02/2023   Yearly kidney health urinalysis for diabetes  07/03/2023   Medicare Annual Wellness Visit  08/24/2023   Cologuard (Stool DNA test)  11/21/2023   DEXA scan (bone density measurement)  Completed   Hepatitis C Screening  Completed   HPV Vaccine  Aged Out   Colon Cancer Screening  Discontinued  *Topic was postponed. The date shown is not the original due date.    Advanced directives: (Provided) Advance directive discussed with you today. I have provided a copy for you to complete at home and have notarized. Once this is complete, please bring a copy in to our office so we can scan it into your chart.   Next Medicare Annual Wellness Visit scheduled for next year: Yes  Preventive Care 25 Years and Older, Female Preventive care refers to lifestyle choices and visits with your health care provider that can promote health and wellness. What does preventive care include? A yearly physical exam. This is also called an annual well check. Dental exams once or twice a year. Routine eye exams. Ask your health care provider how often you should have your eyes checked. Personal lifestyle choices,  including: Daily care of your teeth and gums. Regular physical activity. Eating a healthy diet. Avoiding tobacco and drug use. Limiting alcohol use. Practicing safe sex. Taking low-dose aspirin every day. Taking vitamin and mineral supplements as recommended by your health care provider. What happens during an annual well check? The services and screenings done by your health care provider during your annual well check will depend on your age, overall health, lifestyle risk factors, and family history of disease. Counseling  Your health care provider may ask you questions about your: Alcohol use. Tobacco use. Drug use. Emotional well-being. Home and relationship well-being. Sexual activity. Eating habits. History of falls. Memory and ability to understand (cognition). Work and work Astronomer. Reproductive health. Screening  You may have the following tests or measurements: Height, weight, and BMI. Blood pressure. Lipid and cholesterol levels. These may be checked every 5 years, or more frequently if you are over 21 years old. Skin check. Lung cancer screening. You may have this screening every year starting at age 41 if you have a 30-pack-year history of smoking and currently smoke or have quit within the past 15 years. Fecal occult blood test (FOBT) of the stool. You may have this test every year starting at age 76. Flexible sigmoidoscopy or colonoscopy. You may have a sigmoidoscopy every 5 years or a colonoscopy every 10 years starting at age 46. Hepatitis C blood test. Hepatitis B blood test. Sexually transmitted disease (STD) testing. Diabetes  screening. This is done by checking your blood sugar (glucose) after you have not eaten for a while (fasting). You may have this done every 1-3 years. Bone density scan. This is done to screen for osteoporosis. You may have this done starting at age 42. Mammogram. This may be done every 1-2 years. Talk to your health care provider  about how often you should have regular mammograms. Talk with your health care provider about your test results, treatment options, and if necessary, the need for more tests. Vaccines  Your health care provider may recommend certain vaccines, such as: Influenza vaccine. This is recommended every year. Tetanus, diphtheria, and acellular pertussis (Tdap, Td) vaccine. You may need a Td booster every 10 years. Zoster vaccine. You may need this after age 27. Pneumococcal 13-valent conjugate (PCV13) vaccine. One dose is recommended after age 4. Pneumococcal polysaccharide (PPSV23) vaccine. One dose is recommended after age 92. Talk to your health care provider about which screenings and vaccines you need and how often you need them. This information is not intended to replace advice given to you by your health care provider. Make sure you discuss any questions you have with your health care provider. Document Released: 01/18/2015 Document Revised: 09/11/2015 Document Reviewed: 10/23/2014 Elsevier Interactive Patient Education  2017 [employer].  Fall Prevention in the Home Falls can cause injuries. They can happen to people of all ages. There are many things you can do to make your home safe and to help prevent falls. What can I do on the outside of my home? Regularly fix the edges of walkways and driveways and fix any cracks. Remove anything that might make you trip as you walk through a door, such as a raised step or threshold. Trim any bushes or trees on the path to your home. Use bright outdoor lighting. Clear any walking paths of anything that might make someone trip, such as rocks or tools. Regularly check to see if handrails are loose or broken. Make sure that both sides of any steps have handrails. Any raised decks and porches should have guardrails on the edges. Have any leaves, snow, or ice cleared regularly. Use sand or salt on walking paths during winter. Clean up any spills in  your garage right away. This includes oil or grease spills. What can I do in the bathroom? Use night lights. Install grab bars by the toilet and in the tub and shower. Do not use towel bars as grab bars. Use non-skid mats or decals in the tub or shower. If you need to sit down in the shower, use a plastic, non-slip stool. Keep the floor dry. Clean up any water that spills on the floor as soon as it happens. Remove soap buildup in the tub or shower regularly. Attach bath mats securely with double-sided non-slip rug tape. Do not have throw rugs and other things on the floor that can make you trip. What can I do in the bedroom? Use night lights. Make sure that you have a light by your bed that is easy to reach. Do not use any sheets or blankets that are too big for your bed. They should not hang down onto the floor. Have a firm chair that has side arms. You can use this for support while you get dressed. Do not have throw rugs and other things on the floor that can make you trip. What can I do in the kitchen? Clean up any spills right away. Avoid walking on wet floors. Keep  items that you use a lot in easy-to-reach places. If you need to reach something above you, use a strong step stool that has a grab bar. Keep electrical cords out of the way. Do not use floor polish or wax that makes floors slippery. If you must use wax, use non-skid floor wax. Do not have throw rugs and other things on the floor that can make you trip. What can I do with my stairs? Do not leave any items on the stairs. Make sure that there are handrails on both sides of the stairs and use them. Fix handrails that are broken or loose. Make sure that handrails are as long as the stairways. Check any carpeting to make sure that it is firmly attached to the stairs. Fix any carpet that is loose or worn. Avoid having throw rugs at the top or bottom of the stairs. If you do have throw rugs, attach them to the floor with carpet  tape. Make sure that you have a light switch at the top of the stairs and the bottom of the stairs. If you do not have them, ask someone to add them for you. What else can I do to help prevent falls? Wear shoes that: Do not have high heels. Have rubber bottoms. Are comfortable and fit you well. Are closed at the toe. Do not wear sandals. If you use a stepladder: Make sure that it is fully opened. Do not climb a closed stepladder. Make sure that both sides of the stepladder are locked into place. Ask someone to hold it for you, if possible. Clearly mark and make sure that you can see: Any grab bars or handrails. First and last steps. Where the edge of each step is. Use tools that help you move around (mobility aids) if they are needed. These include: Canes. Walkers. Scooters. Crutches. Turn on the lights when you go into a dark area. Replace any light bulbs as soon as they burn out. Set up your furniture so you have a clear path. Avoid moving your furniture around. If any of your floors are uneven, fix them. If there are any pets around you, be aware of where they are. Review your medicines with your doctor. Some medicines can make you feel dizzy. This can increase your chance of falling. Ask your doctor what other things that you can do to help prevent falls. This information is not intended to replace advice given to you by your health care provider. Make sure you discuss any questions you have with your health care provider. Document Released: 10/18/2008 Document Revised: 05/30/2015 Document Reviewed: 01/26/2014 Elsevier Interactive Patient Education  2017 [employer].

## 2022-08-28 ENCOUNTER — Other Ambulatory Visit: Payer: Self-pay | Admitting: Nurse Practitioner

## 2022-09-21 ENCOUNTER — Inpatient Hospital Stay: Admission: RE | Admit: 2022-09-21 | Payer: Medicare Other | Source: Ambulatory Visit

## 2022-09-24 ENCOUNTER — Other Ambulatory Visit: Payer: Self-pay | Admitting: Nurse Practitioner

## 2022-09-24 ENCOUNTER — Telehealth: Payer: Self-pay | Admitting: Nurse Practitioner

## 2022-09-24 DIAGNOSIS — I10 Essential (primary) hypertension: Secondary | ICD-10-CM

## 2022-09-24 NOTE — Telephone Encounter (Signed)
Refill request  lisinopril (ZESTRIL) 20 MG tablet [[id number]]    [id number] [place], [place] - [address] AT [facility] [address] [place] [address] Phone: [phone]  Fax: [phone]

## 2022-09-24 NOTE — Telephone Encounter (Signed)
Requesting: lisinopril (ZESTRIL) 20 MG tablet  Last Visit: 07/02/2022 Next Visit: 10/02/2022 Last Refill: 07-17-21 by Dr. Nash Dimmer  Please Advise

## 2022-09-25 MED ORDER — LISINOPRIL 20 MG PO TABS
20.0000 mg | ORAL_TABLET | Freq: Every day | ORAL | 0 refills | Status: DC
Start: 2022-09-25 — End: 2022-12-14

## 2022-09-25 NOTE — Addendum Note (Signed)
Addended by: Michaela Corner on: 09/25/2022 03:16 PM   Modules accepted: Orders

## 2022-09-25 NOTE — Telephone Encounter (Signed)
Chart supports rx. Last OV: 07/02/2022 Next OV: 10/02/2022

## 2022-09-25 NOTE — Telephone Encounter (Signed)
Left patient a detailed voice message with annotation below and to return call to office with any concerns

## 2022-09-25 NOTE — Telephone Encounter (Signed)
Please return pts call

## 2022-09-28 NOTE — Telephone Encounter (Signed)
Patient is scheduled for 09/29/22 @ 11am

## 2022-09-28 NOTE — Telephone Encounter (Signed)
Returned patient call she wanted know to if can be seen for her left knee as well with the 3 month f/u. She had a fall at her apartment going the flight of stairs. Fell on 3-4 stairs and landed on her knee and having some pain.

## 2022-09-29 ENCOUNTER — Ambulatory Visit (INDEPENDENT_AMBULATORY_CARE_PROVIDER_SITE_OTHER)
Admission: RE | Admit: 2022-09-29 | Discharge: 2022-09-29 | Disposition: A | Discharge status: Home / Self Care | Payer: Medicare Other | Source: Ambulatory Visit | Attending: Nurse Practitioner | Admitting: Nurse Practitioner

## 2022-09-29 ENCOUNTER — Ambulatory Visit (INDEPENDENT_AMBULATORY_CARE_PROVIDER_SITE_OTHER): Payer: Medicare Other | Admitting: Nurse Practitioner

## 2022-09-29 ENCOUNTER — Encounter: Payer: Self-pay | Admitting: Nurse Practitioner

## 2022-09-29 VITALS — BP 128/67 | HR 82 | Wt 178.0 lb

## 2022-09-29 DIAGNOSIS — E1169 Type 2 diabetes mellitus with other specified complication: Secondary | ICD-10-CM

## 2022-09-29 DIAGNOSIS — E785 Hyperlipidemia, unspecified: Secondary | ICD-10-CM

## 2022-09-29 DIAGNOSIS — S8992XA Unspecified injury of left lower leg, initial encounter: Secondary | ICD-10-CM

## 2022-09-29 DIAGNOSIS — E1165 Type 2 diabetes mellitus with hyperglycemia: Secondary | ICD-10-CM

## 2022-09-29 DIAGNOSIS — W108XXA Fall (on) (from) other stairs and steps, initial encounter: Secondary | ICD-10-CM

## 2022-09-29 DIAGNOSIS — Z7984 Long term (current) use of oral hypoglycemic drugs: Secondary | ICD-10-CM

## 2022-09-29 DIAGNOSIS — I10 Essential (primary) hypertension: Secondary | ICD-10-CM

## 2022-09-29 DIAGNOSIS — Z7985 Long-term (current) use of injectable non-insulin antidiabetic drugs: Secondary | ICD-10-CM | POA: Diagnosis not present

## 2022-09-29 NOTE — Assessment & Plan Note (Signed)
Repeat lipid panel Maintain lipitor and zetia dose at this time

## 2022-09-29 NOTE — Assessment & Plan Note (Signed)
BP at goal with amlodipine and lisinopril BP Readings from Last 3 Encounters:  09/29/22 128/67  08/24/22 130/70  07/02/22 (!) 140/80    Maintain med doses Repeat BMP F/up in 3months

## 2022-09-29 NOTE — Patient Instructions (Addendum)
Go to [address] for knee x-ray Use tylenol 650mg  every 8hrs as needed for pain. let me know if you change you mind about use of Ultracet. Schedule fasting lab appt. Need to be fasting 8hrs prior to blood draw. Ok to drink water and take BP meds. Maintain current medications.

## 2022-09-29 NOTE — Progress Notes (Signed)
Established Patient Visit  Patient: Diamond Hughes   DOB: 08-28-1950   72 y.o. Female  MRN: [id number] Visit Date: 09/29/2022  Subjective:    Chief Complaint  Patient presents with  . Knee Injury    2 weeks ago; Pt states she was going down the steps, fell only down 3 steps but landed on her L knee. Pain is mainly localized to the back of the knee. Pt has been to any other office/ED/urgent care. Has not been taking anything for the pain but has been "taking a shot of tequila to numb the pain"   Knee Pain  The incident occurred more than 1 week ago. The incident occurred at home. The injury mechanism was a fall and a direct blow. The pain is present in the left knee. The pain is severe. The pain has been Constant since onset. Associated symptoms include an inability to bear weight. Pertinent negatives include no loss of motion, loss of sensation, muscle weakness, numbness or tingling. She reports no foreign bodies present. The symptoms are aggravated by movement, palpation and weight bearing. She has tried acetaminophen for the symptoms. The treatment provided mild relief.   Essential hypertension BP at goal with amlodipine and lisinopril BP Readings from Last 3 Encounters:  09/29/22 128/67  08/24/22 130/70  07/02/22 (!) 140/80    Maintain med doses Repeat BMP F/up in 3months  Hyperlipidemia associated with type 2 diabetes mellitus (HCC) Repeat lipid panel Maintain lipitor and zetia dose at this time  Type 2 diabetes mellitus with hyperglycemia, without long-term current use of insulin (HCC) Controlled with metformin Ozempic discontinued due to high copay: $500 No glucose check at home No neuropathy, retinopathy or nephropathy  Maintain med dose Repeat hgbA1c and lipid panel today F/up in 6months  Reviewed medical, surgical, and social history today  Medications: Outpatient Medications Prior to Visit  Medication Sig  . allopurinol (ZYLOPRIM) 100 MG tablet  Take 1 tablet (100 mg total) by mouth daily.  Marland Kitchen amLODipine (NORVASC) 5 MG tablet TAKE 1 TABLET(5 MG) BY MOUTH DAILY  . aspirin 81 MG tablet Take 1 tablet (81 mg total) by mouth daily.  Marland Kitchen atorvastatin (LIPITOR) 80 MG tablet Take 1 tablet (80 mg total) by mouth daily at 6 PM.  . clopidogrel (PLAVIX) 75 MG tablet TAKE 1 TABLET BY MOUTH EVERY DAY WITH BREAKFAST  . co-enzyme Q-10 30 MG capsule Take 30 mg by mouth 3 (three) times daily.  Marland Kitchen ezetimibe (ZETIA) 10 MG tablet Take 1 tablet (10 mg total) by mouth daily.  Marland Kitchen KRILL OIL PO Take by mouth.  . levothyroxine (SYNTHROID) 75 MCG tablet TAKE 1 TABLET(75 MCG) BY MOUTH DAILY BEFORE BREAKFAST  . lisinopril (ZESTRIL) 20 MG tablet Take 1 tablet (20 mg total) by mouth daily.  . metFORMIN (GLUCOPHAGE) 500 MG tablet Take 0.5 tablets (250 mg total) by mouth daily with breakfast.  . metoprolol tartrate (LOPRESSOR) 50 MG tablet TAKE 1 TABLET(50 MG) BY MOUTH TWICE DAILY  . Misc Natural Products (BEET ROOT PO) Take by mouth. Powder mixed with water  . nitroGLYCERIN (NITROSTAT) 0.4 MG SL tablet Place 1 tablet (0.4 mg total) under the tongue every 5 (five) minutes x 3 doses as needed for chest pain.  . Omega-3 Fatty Acids (OMEGA-3 PO) Take 1 capsule by mouth daily.  Marland Kitchen VITAMIN D PO Take by mouth.  . clotrimazole-betamethasone (LOTRISONE) cream Apply 1 application topically 2 (two) times  daily. (Patient not taking: Reported on 08/24/2022)  . indomethacin (INDOCIN) 25 MG capsule Take 1 capsule (25 mg total) by mouth 2 (two) times daily as needed. With food (Patient not taking: Reported on 08/24/2022)   No facility-administered medications prior to visit.   Reviewed past medical and social history.   ROS per HPI above      Objective:  BP 128/67   Pulse 82   Wt 178 lb (80.7 kg)   SpO2 98%   BMI 31.04 kg/m      Physical Exam Vitals and nursing note reviewed.  Cardiovascular:     Rate and Rhythm: Normal rate and regular rhythm.     Pulses: Normal pulses.      Heart sounds: Normal heart sounds.  Pulmonary:     Effort: Pulmonary effort is normal.     Breath sounds: Normal breath sounds.  Musculoskeletal:     Right upper leg: Normal.     Left upper leg: Normal.     Right knee: Normal.     Left knee: Swelling, effusion and bony tenderness present. No crepitus. Decreased range of motion. Tenderness present over the medial joint line. No patellar tendon tenderness. Normal pulse.     Right lower leg: Normal.     Left lower leg: Normal.  Neurological:     Mental Status: She is alert and oriented to person, place, and time.    No results found for any visits on 09/29/22.    Assessment & Plan:    Problem List Items Addressed This Visit     Essential hypertension    BP at goal with amlodipine and lisinopril BP Readings from Last 3 Encounters:  09/29/22 128/67  08/24/22 130/70  07/02/22 (!) 140/80    Maintain med doses Repeat BMP F/up in 3months      Hyperlipidemia associated with type 2 diabetes mellitus (HCC)    Repeat lipid panel Maintain lipitor and zetia dose at this time      Relevant Orders   Lipid panel   Type 2 diabetes mellitus with hyperglycemia, without long-term current use of insulin (HCC)    Controlled with metformin Ozempic discontinued due to high copay: $500 No glucose check at home No neuropathy, retinopathy or nephropathy  Maintain med dose Repeat hgbA1c and lipid panel today F/up in 6months      Relevant Orders   Hemoglobin A1c   Other Visit Diagnoses     Fall (on) (from) other stairs and steps, initial encounter    -  Primary   Relevant Orders   DG Knee Complete 4 Views Left (Completed)   MR KNEE LEFT WO CONTRAST   Injury of left knee, initial encounter       Relevant Orders   DG Knee Complete 4 Views Left (Completed)   MR KNEE LEFT WO CONTRAST      Knee x-ray today:Small effusion without fracture or other acute finding.  Ordered MRI knee due to high suspicion for Meniscus vs ligament  injury. She declined use of ultracet at this time. She opted to use tylenol OVER THE COUNTER dose. Advised to elevated leg as much as possible and apply cold compress.  Return in about 6 months (around 03/29/2023) for HTN, DM, hyperlipidemia (fasting).     Alysia Penna, NP

## 2022-09-29 NOTE — Assessment & Plan Note (Addendum)
Controlled with metformin Ozempic discontinued due to high copay: $500 No glucose check at home No neuropathy, retinopathy or nephropathy  Maintain med dose Repeat hgbA1c and lipid panel today F/up in 6months

## 2022-10-02 ENCOUNTER — Ambulatory Visit (HOSPITAL_BASED_OUTPATIENT_CLINIC_OR_DEPARTMENT_OTHER)
Admission: RE | Admit: 2022-10-02 | Discharge: 2022-10-02 | Disposition: A | Discharge status: Home / Self Care | Payer: Medicare Other | Source: Ambulatory Visit | Attending: Nurse Practitioner

## 2022-10-02 ENCOUNTER — Other Ambulatory Visit: Payer: Medicare Other

## 2022-10-02 ENCOUNTER — Ambulatory Visit: Payer: Medicare Other | Admitting: Nurse Practitioner

## 2022-10-02 DIAGNOSIS — M25462 Effusion, left knee: Secondary | ICD-10-CM | POA: Insufficient documentation

## 2022-10-02 DIAGNOSIS — W109XXA Fall (on) (from) unspecified stairs and steps, initial encounter: Secondary | ICD-10-CM | POA: Diagnosis not present

## 2022-10-02 DIAGNOSIS — W108XXA Fall (on) (from) other stairs and steps, initial encounter: Secondary | ICD-10-CM

## 2022-10-02 DIAGNOSIS — S8992XA Unspecified injury of left lower leg, initial encounter: Secondary | ICD-10-CM

## 2022-10-05 ENCOUNTER — Other Ambulatory Visit: Payer: Self-pay | Admitting: Nurse Practitioner

## 2022-10-05 ENCOUNTER — Telehealth: Payer: Self-pay | Admitting: Nurse Practitioner

## 2022-10-05 DIAGNOSIS — S8992XD Unspecified injury of left lower leg, subsequent encounter: Secondary | ICD-10-CM

## 2022-10-05 DIAGNOSIS — M23307 Other meniscus derangements, unspecified meniscus, left knee: Secondary | ICD-10-CM

## 2022-10-05 DIAGNOSIS — S83512A Sprain of anterior cruciate ligament of left knee, initial encounter: Secondary | ICD-10-CM

## 2022-10-05 NOTE — Telephone Encounter (Signed)
Pt called and stated she is returning your call . Please call her

## 2022-10-05 NOTE — Telephone Encounter (Signed)
Spoke with pt regarding results/recommendations.  

## 2022-10-06 NOTE — Telephone Encounter (Signed)
Please advise. Pt would like to know which company she needs to reach out to for knee brace.

## 2022-10-06 NOTE — Telephone Encounter (Signed)
Pt says Diamond Hughes advised her that she would be getting a call from where to get her knee  brace from. They contacted her and she needs to get in touch with them. She is wanting the # of the facility who called her. Please advise pt at    [phone] [facility])

## 2022-10-06 NOTE — Telephone Encounter (Signed)
Spoke with pt and advised her of where to purchase knee brace. She received a call from [facility] but was not sure why. Information given to pt and she states she will call them back.

## 2022-10-11 NOTE — Progress Notes (Unsigned)
Cardiology Office Note:  .   Date:  10/12/2022  ID:  KEISY STRICKLER, DOB March 10, 1950, MRN [id number] PCP: Anne Ng, NP  [facility] Providers Cardiologist:  Verne Carrow, MD {  History of Present Illness: .   YELITZA REACH is a 72 y.o. 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  Has a DES to the circumflex that failed angioplasty to the RCA in-stent stenosis and distal disease due to inability to cross the balloon.  Medical therapy was recommended for the RCA lesion.  2D echo 2/19 showed LVEF 45 to 50% with basal to mid inferior posterior hypokinesis and grade 1 DD.  Frequent PVCs on prior EKGs and was advised to reduce caffeine and alcohol consumption.   She was last seen 04/18/2020 and felt well at that time.  Denied chest pain, SOB, PND, orthopnea, pedal edema, lightheadedness, syncope and bleeding issues.  No heartburn symptoms.  Denied any issues with medicines.  A1c 6.6, started on metformin.  Committed to increasing exercise and improving diet.  Joined a gym and has a stationary bike.  Lives on the third floor and is able to carry groceries up 3 flights of stairs without stopping or difficulty.  Blood pressure well-controlled.  Today, she tells me  her knee has been bothering her she fell down the stairs, heel got caught in her jeans. He is scheduled to see [facility]. No chest pain or SOB. No plapitations. She moved in May and her diet has been bad. Eating out a lot. Trying to make some foods at home. Her LDL was above goal back in the summer and she is on a statin and zetia, compliant with medications. Plan to recheck in a few months after dietary changes  have been made.   Reports no shortness of breath nor dyspnea on exertion. Reports no chest pain, pressure, or tightness. No edema, orthopnea, PND. Reports no palpitations.    ROS: Pertinent ROS in HPI  Studies Reviewed: Marland Kitchen   EKG Interpretation Date/Time:  Monday October 12 2022 09:03:02 EDT Ventricular Rate:  68 PR Interval:  174 QRS Duration:  76 QT Interval:  446 QTC Calculation: 474 R Axis:   16  Text Interpretation: Normal sinus rhythm Normal ECG When compared with ECG of 11-Feb-2016 06:35, Criteria for Septal infarct are no longer Present ST no longer depressed in Inferior leads Nonspecific T wave abnormality no longer evident in Inferior leads Confirmed by Jari Favre [phone]) on 10/12/2022 9:08:10 AM    IMPRESSIONS     1. The left ventricle has normal systolic function, with an ejection  fraction of 55-60%. The cavity size was normal. Left ventricular diastolic  Doppler parameters are consistent with pseudonormalization. Elevated mean  left atrial pressure No evidence of   left ventricular regional wall motion abnormalities.   2. The right ventricle has normal systolic function. The cavity was  normal. There is no increase in right ventricular wall thickness. Right  ventricular systolic pressure could not be assessed.   3. Left atrial size was mildly dilated.   4. The MR jet is posteriorly-directed.   5. The aorta is normal in size and structure.   FINDINGS  Left Ventricle: The left ventricle has normal systolic function, with an  ejection fraction of 55-60%. The cavity size was normal. There is no  increase in left ventricular wall thickness. Left ventricular diastolic  Doppler parameters are consistent with  pseudonormalization. Elevated mean left atrial pressure No evidence of  left ventricular regional wall motion abnormalities..   Right Ventricle: The right ventricle has normal systolic function. The  cavity was normal. There is no increase in right ventricular  wall  thickness. Right ventricular systolic pressure could not be assessed.   Left Atrium: Left atrial size was mildly dilated.   Right Atrium: Right atrial size was normal in size. Right atrial pressure  is estimated at 10 mmHg.   Interatrial Septum: No atrial level shunt detected by color flow Doppler.   Pericardium: There is no evidence of pericardial effusion.   Mitral Valve: The mitral valve is normal in structure. Mitral valve  regurgitation is mild by color flow Doppler. The MR jet is  posteriorly-directed.   Tricuspid Valve: The tricuspid valve is normal in structure. Tricuspid  valve regurgitation was not visualized by color flow Doppler.   Aortic Valve: The aortic valve is normal in structure. Aortic valve  regurgitation was not visualized by color flow Doppler.   Pulmonic Valve: The pulmonic valve was normal in structure. Pulmonic valve  regurgitation is not visualized by color flow Doppler.   Aorta: The aorta is normal in size and structure.   Venous: The inferior vena cava is normal in size with greater than 50%  respiratory variability.        Physical Exam:   VS:  BP (!) 156/82   Pulse 63   Ht 5' 3.5" (1.613 m)   Wt 179 lb 12.8 oz (81.6 kg)   SpO2 96%   BMI 31.35 kg/m    Wt Readings from Last 3 Encounters:  10/12/22 179 lb 12.8 oz (81.6 kg)  09/29/22 178 lb (80.7 kg)  08/24/22 175 lb 12.8 oz (79.7 kg)    GEN: Well nourished, well developed in no acute distress NECK: No JVD; No carotid bruits CARDIAC: RRR, no murmurs, rubs, gallops RESPIRATORY:  Clear to auscultation without rales, wheezing or rhonchi  ABDOMEN: Soft, non-tender, non-distended EXTREMITIES:  No edema; No deformity   ASSESSMENT AND PLAN: .   HTN -the last time she had it checked 128/67-couple of weeks ago -keep track at home and send me those values -continue current medications -Currently taking amlodipine 5 mg daily, aspirin 81 mg daily, Lipitor 80 mg daily, Plavix 75 mg daily,  Zetia 10 mg daily, lisinopril 20 mg daily, Lopressor 50 mg twice a day, nitro as needed -recommended low sodium, heart healthy diet  CAD -continue current medications -no chest pain or SOB  HLD -has not been working out due to her knee pain -recommended eating out less and watching her salt -continue lipitor and zetia for now  OBESITY -recommend increasing activity as able and sticking to low sodium, heart healthy diet      Dispo: She can follow-up in a year with Dr. Clifton James  Signed, Sharlene Dory, PA-C

## 2022-10-12 ENCOUNTER — Ambulatory Visit: Payer: Medicare Other | Admitting: Physician Assistant

## 2022-10-12 ENCOUNTER — Encounter: Payer: Self-pay | Admitting: Physician Assistant

## 2022-10-12 ENCOUNTER — Ambulatory Visit: Payer: Medicare Other | Attending: Physician Assistant | Admitting: Physician Assistant

## 2022-10-12 VITALS — BP 135/71 | HR 77 | Ht 63.0 in | Wt 181.0 lb

## 2022-10-12 VITALS — BP 156/82 | HR 63 | Ht 63.5 in | Wt 179.8 lb

## 2022-10-12 DIAGNOSIS — E669 Obesity, unspecified: Secondary | ICD-10-CM

## 2022-10-12 DIAGNOSIS — I10 Essential (primary) hypertension: Secondary | ICD-10-CM

## 2022-10-12 DIAGNOSIS — I2510 Atherosclerotic heart disease of native coronary artery without angina pectoris: Secondary | ICD-10-CM

## 2022-10-12 DIAGNOSIS — M25562 Pain in left knee: Secondary | ICD-10-CM

## 2022-10-12 DIAGNOSIS — E785 Hyperlipidemia, unspecified: Secondary | ICD-10-CM | POA: Diagnosis not present

## 2022-10-12 NOTE — Patient Instructions (Signed)
Medication Instructions:  Your physician recommends that you continue on your current medications as directed. Please refer to the Current Medication list given to you today.  *If you need a refill on your cardiac medications before your next appointment, please call your pharmacy*  Lab Work: None ordered If you have labs (blood work) drawn today and your tests are completely normal, you will receive your results only by: MyChart Message (if you have MyChart) OR A paper copy in the mail If you have any lab test that is abnormal or we need to change your treatment, we will call you to review the results.  Follow-Up: At [facility], you and your health needs are our priority.  As part of our continuing mission to provide you with exceptional heart care, we have created designated Provider Care Teams.  These Care Teams include your primary Cardiologist (physician) and Advanced Practice Providers (APPs -  Physician Assistants and Nurse Practitioners) who all work together to provide you with the care you need, when you need it.  We recommend signing up for the patient portal called "MyChart".  Sign up information is provided on this After Visit Summary.  MyChart is used to connect with patients for Virtual Visits (Telemedicine).  Patients are able to view lab/test results, encounter notes, upcoming appointments, etc.  Non-urgent messages can be sent to your provider as well.   To learn more about what you can do with MyChart, go to [email or web address].    Your next appointment:   1 year(s)  Provider:   Verne Carrow, MD     Other Instructions Check your blood pressure daily, 1 hr after morning medications for 2 weeks, keep a log and send [place] the readings through mychart at the end of the 2 weeks.   Heart-Healthy Eating Plan Many factors influence your heart health, including eating and exercise habits. Heart health is also called coronary health. Coronary risk increases  with abnormal blood fat (lipid) levels. A heart-healthy eating plan includes limiting unhealthy fats, increasing healthy fats, limiting salt (sodium) intake, and making other diet and lifestyle changes. What is my plan? Your health care provider may recommend that: You limit your fat intake to _________% or less of your total calories each day. You limit your saturated fat intake to _________% or less of your total calories each day. You limit the amount of cholesterol in your diet to less than _________ mg per day. You limit the amount of sodium in your diet to less than _________ mg per day. What are tips for following this plan? Cooking Cook foods using methods other than frying. Baking, boiling, grilling, and broiling are all good options. Other ways to reduce fat include: Removing the skin from poultry. Removing all visible fats from meats. Steaming vegetables in water or broth. Meal planning  At meals, imagine dividing your plate into fourths: Fill one-half of your plate with vegetables and green salads. Fill one-fourth of your plate with whole grains. Fill one-fourth of your plate with lean protein foods. Eat 2-4 cups of vegetables per day. One cup of vegetables equals 1 cup (91 g) broccoli or cauliflower florets, 2 medium carrots, 1 large bell pepper, 1 large sweet potato, 1 large tomato, 1 medium white potato, 2 cups (150 g) raw leafy greens. Eat 1-2 cups of fruit per day. One cup of fruit equals 1 small apple, 1 large banana, 1 cup (237 g) mixed fruit, 1 large orange,  cup (82 g) dried fruit, 1 cup (240  mL) 100% fruit juice. Eat more foods that contain soluble fiber. Examples include apples, broccoli, carrots, beans, peas, and barley. Aim to get 25-30 g of fiber per day. Increase your consumption of legumes, nuts, and seeds to 4-5 servings per week. One serving of dried beans or legumes equals  cup (90 g) cooked, 1 serving of nuts is  oz (12 almonds, 24 pistachios, or 7 walnut  halves), and 1 serving of seeds equals  oz (8 g). Fats Choose healthy fats more often. Choose monounsaturated and polyunsaturated fats, such as olive and canola oils, avocado oil, flaxseeds, walnuts, almonds, and seeds. Eat more omega-3 fats. Choose salmon, mackerel, sardines, tuna, flaxseed oil, and ground flaxseeds. Aim to eat fish at least 2 times each week. Check food labels carefully to identify foods with trans fats or high amounts of saturated fat. Limit saturated fats. These are found in animal products, such as meats, butter, and cream. Plant sources of saturated fats include palm oil, palm kernel oil, and coconut oil. Avoid foods with partially hydrogenated oils in them. These contain trans fats. Examples are stick margarine, some tub margarines, cookies, crackers, and other baked goods. Avoid fried foods. General information Eat more home-cooked food and less restaurant, buffet, and fast food. Limit or avoid alcohol. Limit foods that are high in added sugar and simple starches such as foods made using white refined flour (white breads, pastries, sweets). Lose weight if you are overweight. Losing just 5-10% of your body weight can help your overall health and prevent diseases such as diabetes and heart disease. Monitor your sodium intake, especially if you have high blood pressure. Talk with your health care provider about your sodium intake. Try to incorporate more vegetarian meals weekly. What foods should I eat? Fruits All fresh, canned (in natural juice), or frozen fruits. Vegetables Fresh or frozen vegetables (raw, steamed, roasted, or grilled). Green salads. Grains Most grains. Choose whole wheat and whole grains most of the time. Rice and pasta, including brown rice and pastas made with whole wheat. Meats and other proteins Lean, well-trimmed beef, veal, pork, and lamb. Chicken and [place] without skin. All fish and shellfish. Wild duck, rabbit, pheasant, and venison. Egg  whites or low-cholesterol egg substitutes. Dried beans, peas, lentils, and tofu. Seeds and most nuts. Dairy Low-fat or nonfat cheeses, including ricotta and mozzarella. Skim or 1% milk (liquid, powdered, or evaporated). Buttermilk made with low-fat milk. Nonfat or low-fat yogurt. Fats and oils Non-hydrogenated (trans-free) margarines. Vegetable oils, including soybean, sesame, sunflower, olive, avocado, peanut, safflower, corn, canola, and cottonseed. Salad dressings or mayonnaise made with a vegetable oil. Beverages Water (mineral or sparkling). Coffee and tea. Unsweetened ice tea. Diet beverages. Sweets and desserts Sherbet, gelatin, and fruit ice. Small amounts of dark chocolate. Limit all sweets and desserts. Seasonings and condiments All seasonings and condiments. The items listed above may not be a complete list of foods and beverages you can eat. Contact a dietitian for more options. What foods should I avoid? Fruits Canned fruit in heavy syrup. Fruit in cream or butter sauce. Fried fruit. Limit coconut. Vegetables Vegetables cooked in cheese, cream, or butter sauce. Fried vegetables. Grains Breads made with saturated or trans fats, oils, or whole milk. Croissants. Sweet rolls. Donuts. High-fat crackers, such as cheese crackers and chips. Meats and other proteins Fatty meats, such as hot dogs, ribs, sausage, bacon, rib-eye roast or steak. High-fat deli meats, such as salami and bologna. Caviar. Domestic duck and goose. Organ meats, such as liver. Dairy  Cream, sour cream, cream cheese, and creamed cottage cheese. Whole-milk cheeses. Whole or 2% milk (liquid, evaporated, or condensed). Whole buttermilk. Cream sauce or high-fat cheese sauce. Whole-milk yogurt. Fats and oils Meat fat, or shortening. Cocoa butter, hydrogenated oils, palm oil, coconut oil, palm kernel oil. Solid fats and shortenings, including bacon fat, salt pork, lard, and butter. Nondairy cream substitutes. Salad  dressings with cheese or sour cream. Beverages Regular sodas and any drinks with added sugar. Sweets and desserts Frosting. Pudding. Cookies. Cakes. Pies. Milk chocolate or white chocolate. Buttered syrups. Full-fat ice cream or ice cream drinks. The items listed above may not be a complete list of foods and beverages to avoid. Contact a dietitian for more information. Summary Heart-healthy meal planning includes limiting unhealthy fats, increasing healthy fats, limiting salt (sodium) intake and making other diet and lifestyle changes. Lose weight if you are overweight. Losing just 5-10% of your body weight can help your overall health and prevent diseases such as diabetes and heart disease. Focus on eating a balance of foods, including fruits and vegetables, low-fat or nonfat dairy, lean protein, nuts and legumes, whole grains, and heart-healthy oils and fats. This information is not intended to replace advice given to you by your health care provider. Make sure you discuss any questions you have with your health care provider. Document Revised: 01/27/2021 Document Reviewed: 01/27/2021 Elsevier Patient Education  2024 [employer] Low-Sodium Eating Plan Salt (sodium) helps you keep a healthy balance of fluids in your body. Too much sodium can raise your blood pressure. It can also cause fluid and waste to be held in your body. Your health care provider or dietitian may recommend a low-sodium eating plan if you have high blood pressure (hypertension), kidney disease, liver disease, or heart failure. Eating less sodium can help lower your blood pressure and reduce swelling. It can also protect your heart, liver, and kidneys. What are tips for following this plan? Reading food labels  Check food labels for the amount of sodium per serving. If you eat more than one serving, you must multiply the listed amount by the number of servings. Choose foods with less than 140 milligrams (mg) of sodium per  serving. Avoid foods with 300 mg of sodium or more per serving. Always check how much sodium is in a product, even if the label says "unsalted" or "no salt added." Shopping  Buy products labeled as "low-sodium" or "no salt added." Buy fresh foods. Avoid canned foods and pre-made or frozen meals. Avoid canned, cured, or processed meats. Buy breads that have less than 80 mg of sodium per slice. Cooking  Eat more home-cooked food. Try to eat less restaurant, buffet, and fast food. Try not to add salt when you cook. Use salt-free seasonings or herbs instead of table salt or sea salt. Check with your provider or pharmacist before using salt substitutes. Cook with plant-based oils, such as canola, sunflower, or olive oil. Meal planning When eating at a restaurant, ask if your food can be made with less salt or no salt. Avoid dishes labeled as brined, pickled, cured, or smoked. Avoid dishes made with soy sauce, miso, or teriyaki sauce. Avoid foods that have monosodium glutamate (MSG) in them. MSG may be added to some restaurant food, sauces, soups, bouillon, and canned foods. Make meals that can be grilled, baked, poached, roasted, or steamed. These are often made with less sodium. General information Try to limit your sodium intake to 1,500-2,300 mg each day, or the amount told  by your provider. What foods should I eat? Fruits Fresh, frozen, or canned fruit. Fruit juice. Vegetables Fresh or frozen vegetables. "No salt added" canned vegetables. "No salt added" tomato sauce and paste. Low-sodium or reduced-sodium tomato and vegetable juice. Grains Low-sodium cereals, such as oats, puffed wheat and rice, and shredded wheat. Low-sodium crackers. Unsalted rice. Unsalted pasta. Low-sodium bread. Whole grain breads and whole grain pasta. Meats and other proteins Fresh or frozen meat, poultry, seafood, and fish. These should have no added salt. Low-sodium canned tuna and salmon. Unsalted nuts. Dried  peas, beans, and lentils without added salt. Unsalted canned beans. Eggs. Unsalted nut butters. Dairy Milk. Soy milk. Cheese that is naturally low in sodium, such as ricotta cheese, fresh mozzarella, or Swiss cheese. Low-sodium or reduced-sodium cheese. Cream cheese. Yogurt. Seasonings and condiments Fresh and dried herbs and spices. Salt-free seasonings. Low-sodium mustard and ketchup. Sodium-free salad dressing. Sodium-free light mayonnaise. Fresh or refrigerated horseradish. Lemon juice. Vinegar. Other foods Homemade, reduced-sodium, or low-sodium soups. Unsalted popcorn and pretzels. Low-salt or salt-free chips. The items listed above may not be all the foods and drinks you can have. Talk to a dietitian to learn more. What foods should I avoid? Vegetables Sauerkraut, pickled vegetables, and relishes. Olives. [place] fries. Onion rings. Regular canned vegetables, except low-sodium or reduced-sodium items. Regular canned tomato sauce and paste. Regular tomato and vegetable juice. Frozen vegetables in sauces. Grains Instant hot cereals. Bread stuffing, pancake, and biscuit mixes. Croutons. Seasoned rice or pasta mixes. Noodle soup cups. Boxed or frozen macaroni and cheese. Regular salted crackers. Self-rising flour. Meats and other proteins Meat or fish that is salted, canned, smoked, spiced, or pickled. Precooked or cured meat, such as sausages or meat loaves. Tomasa Blase. Ham. Pepperoni. Hot dogs. Corned beef. Chipped beef. Salt pork. Jerky. Pickled herring, anchovies, and sardines. Regular canned tuna. Salted nuts. Dairy Processed cheese and cheese spreads. Hard cheeses. Cheese curds. Blue cheese. Feta cheese. String cheese. Regular cottage cheese. Buttermilk. Canned milk. Fats and oils Salted butter. Regular margarine. Ghee. Bacon fat. Seasonings and condiments Onion salt, garlic salt, seasoned salt, table salt, and sea salt. Canned and packaged gravies. Worcestershire sauce. Tartar sauce. Barbecue  sauce. Teriyaki sauce. Soy sauce, including reduced-sodium soy sauce. Steak sauce. Fish sauce. Oyster sauce. Cocktail sauce. Horseradish that you find on the shelf. Regular ketchup and mustard. Meat flavorings and tenderizers. Bouillon cubes. Hot sauce. Pre-made or packaged marinades. Pre-made or packaged taco seasonings. Relishes. Regular salad dressings. Salsa. Other foods Salted popcorn and pretzels. Corn chips and puffs. Potato and tortilla chips. Canned or dried soups. Pizza. Frozen entrees and pot pies. The items listed above may not be all the foods and drinks you should avoid. Talk to a dietitian to learn more. This information is not intended to replace advice given to you by your health care provider. Make sure you discuss any questions you have with your health care provider. Document Revised: 01/08/2022 Document Reviewed: 01/08/2022 Elsevier Patient Education  2024 [employer].

## 2022-10-12 NOTE — Progress Notes (Signed)
Office Visit Note   Patient: Diamond Hughes           Date of Birth: 08-01-1950           MRN: [id number] Visit Date: 10/12/2022              Requested by: Anne Ng, NP [address] [place],  [place] [address] PCP: Anne Ng, NP   Assessment & Plan: Visit Diagnoses:  1. Acute pain of left knee     Plan: [name] is a pleasant active 72 year old woman who has a 1 month history of left knee pain.  She said a month ago she fell down some stairs came down on her knee.  She denies any popping or instability feelings but had quite a bit of pain.  She was seen evaluated by primary care followed by an MRI.  She has never had any injury to her knee before.  She does not really complain of instability she complains more of pain on the posterior medial aspect of her knee.  MRI was reviewed today she does have a proximal ACL tear but this may be more of a degenerative tear.  She has an effusion she does have a fracture of the posterior rim of the medial tibial plateau with minimal impaction.  Certainly this can account for her pain.  I reviewed her findings with Dr. Shon Baton I feel that physical therapy would be appropriate at this time.  We did not go forward with an aspiration as the infusion has now been there for a month.  If she had continued pain in the future could consider viscosupplementation.  Could also try aspirating the knee if this were a joint fluid rather than hematoma.  She would like to try physical therapy will follow-up in a month  Follow-Up Instructions: No follow-ups on file.   Orders:  Orders Placed This Encounter  Procedures   Ambulatory referral to Physical Therapy   No orders of the defined types were placed in this encounter.     Procedures: No procedures performed   Clinical Data: No additional findings.   Subjective: Chief Complaint  Patient presents with   Right Knee - Pain    Pain in right knee  has had MRI and x rays      HPI Pleasant 72 year old woman who presents today with a 1 month history of posterior medial knee pain and swelling after a fall.  Rates her pain is moderate uses a cane to ambulate Review of Systems  All other systems reviewed and are negative.    Objective: Vital Signs: BP 135/71   Pulse 77   Ht 5\' 3"  (1.6 m)   Wt 181 lb (82.1 kg)   BMI 32.06 kg/m   Physical Exam Constitutional:      Appearance: Normal appearance.  Pulmonary:     Effort: Pulmonary effort is normal.  Skin:    General: Skin is warm and dry.  Neurological:     Mental Status: She is alert.     Ortho Exam Left knee she has a moderate effusion but no cellulitis no erythema compartments are soft and nontender she is neurovascularly intact.  She has no pain with varus valgus stressing.  No real instability on anterior draw.  Is tender over the posterior medial joint line of the tibial plateau Specialty Comments:  No specialty comments available.  Imaging: No results found.   PMFS History: Patient Active Problem List   Diagnosis Date Noted  Gouty arthritis of right great toe 10/22/2020   Overweight (BMI 25.0-29.9) 04/18/2020   Type 2 diabetes mellitus with hyperglycemia, without long-term current use of insulin (HCC) 03/14/2020   Sciatica without lumbago 02/01/2019   Acquired hypothyroidism 12/06/2017   Ischemic cardiomyopathy 02/20/2016   Hyperlipidemia associated with type 2 diabetes mellitus (HCC) 02/12/2016   CAD (coronary artery disease) 02/12/2016   Normocytic anemia 02/12/2016   NSTEMI (non-ST elevated myocardial infarction) (HCC) 02/08/2016   Essential hypertension 02/10/2012   Past Medical History:  Diagnosis Date   CAD (coronary artery disease)    a. 2000 s/p MI and RCA PCI [facility]);  b. 02/2016 NSTEMI/PCI: LM nl, LAD nl, LCX 100p (3.5x18 Resolute Onyx DES), OM1/3 small, RCA 70p aneurysmal, 57m/d ISR-->attempted PCI, could not cross w/ wire-->med rx, EF 45-50%.   Hyperlipidemia     Hypertension    Ischemic cardiomyopathy    a. 02/2016 Echo: EF 45-50%, basal-mid inferior/posterior HK, Gr1 DD, triv MR, nl LA size, nl RV fxn.   Normocytic anemia     Family History  Problem Relation Age of Onset   Heart disease Mother    Hyperlipidemia Mother    Hypertension Mother    Hyperlipidemia Sister    Hypertension Sister    Breast cancer Maternal Grandmother     Past Surgical History:  Procedure Laterality Date   ANGIOPLASTY  2000   CORONARY STENT INTERVENTION N/A 02/10/2016   Procedure: Coronary Stent Intervention;  Surgeon: Lyn Records, MD;  Location: [facility] INVASIVE CV LAB;  Service: Cardiovascular;  Laterality: N/A;   LEFT HEART CATH AND CORONARY ANGIOGRAPHY N/A 02/10/2016   Procedure: Left Heart Cath and Coronary Angiography;  Surgeon: Lyn Records, MD;  Location: [facility] INVASIVE CV LAB;  Service: Cardiovascular;  Laterality: N/A;   Social History   Occupational History   Occupation: retired  Tobacco Use   Smoking status: Former    Current packs/day: 0.20    Average packs/day: 0.2 packs/day for 15.0 years (3.0 ttl pk-yrs)    Types: Cigarettes   Smokeless tobacco: Never  Vaping Use   Vaping status: Never Used  Substance and Sexual Activity   Alcohol use: Yes    Comment: 40oz beer   Drug use: No   Sexual activity: Yes    Birth control/protection: None

## 2022-10-23 ENCOUNTER — Other Ambulatory Visit: Payer: Self-pay | Admitting: Nurse Practitioner

## 2022-10-27 ENCOUNTER — Ambulatory Visit: Payer: Medicare Other | Admitting: Physical Therapy

## 2022-10-27 ENCOUNTER — Ambulatory Visit: Payer: Medicare Other | Admitting: Rehabilitative and Restorative Service Providers"

## 2022-10-27 ENCOUNTER — Other Ambulatory Visit: Payer: Self-pay

## 2022-10-27 ENCOUNTER — Encounter: Payer: Self-pay | Admitting: Rehabilitative and Restorative Service Providers"

## 2022-10-27 DIAGNOSIS — M25562 Pain in left knee: Secondary | ICD-10-CM

## 2022-10-27 DIAGNOSIS — M6281 Muscle weakness (generalized): Secondary | ICD-10-CM

## 2022-10-27 DIAGNOSIS — R600 Localized edema: Secondary | ICD-10-CM

## 2022-10-27 DIAGNOSIS — R262 Difficulty in walking, not elsewhere classified: Secondary | ICD-10-CM

## 2022-10-27 NOTE — Therapy (Addendum)
OUTPATIENT PHYSICAL THERAPY EVALUATION   Patient Name: Diamond Hughes MRN: [id number] DOB:July 14, 1950, 72 y.o., female Today's Date: 10/27/2022  END OF SESSION:  PT End of Session - 10/27/22 1055     Visit Number 1    Number of Visits 20    Date for PT Re-Evaluation 01/05/23    Authorization Type [employer] Medicare $20 copay    Progress Note Due on Visit 10    PT Start Time 1058    PT Stop Time 1136    PT Time Calculation (min) 38 min    Activity Tolerance Patient tolerated treatment well    Behavior During Therapy WFL for tasks assessed/performed             Past Medical History:  Diagnosis Date   CAD (coronary artery disease)    a. 2000 s/p MI and RCA PCI [facility]);  b. 02/2016 NSTEMI/PCI: LM nl, LAD nl, LCX 100p (3.5x18 Resolute Onyx DES), OM1/3 small, RCA 70p aneurysmal, 57m/d ISR-->attempted PCI, could not cross w/ wire-->med rx, EF 45-50%.   Hyperlipidemia    Hypertension    Ischemic cardiomyopathy    a. 02/2016 Echo: EF 45-50%, basal-mid inferior/posterior HK, Gr1 DD, triv MR, nl LA size, nl RV fxn.   Normocytic anemia    Past Surgical History:  Procedure Laterality Date   ANGIOPLASTY  2000   CORONARY STENT INTERVENTION N/A 02/10/2016   Procedure: Coronary Stent Intervention;  Surgeon: Lyn Records, MD;  Location: [facility] INVASIVE CV LAB;  Service: Cardiovascular;  Laterality: N/A;   LEFT HEART CATH AND CORONARY ANGIOGRAPHY N/A 02/10/2016   Procedure: Left Heart Cath and Coronary Angiography;  Surgeon: Lyn Records, MD;  Location: [facility] INVASIVE CV LAB;  Service: Cardiovascular;  Laterality: N/A;   Patient Active Problem List   Diagnosis Date Noted   Gouty arthritis of right great toe 10/22/2020   Overweight (BMI 25.0-29.9) 04/18/2020   Type 2 diabetes mellitus with hyperglycemia, without long-term current use of insulin (HCC) 03/14/2020   Sciatica without lumbago 02/01/2019   Acquired hypothyroidism 12/06/2017   Ischemic cardiomyopathy 02/20/2016   Hyperlipidemia  associated with type 2 diabetes mellitus (HCC) 02/12/2016   CAD (coronary artery disease) 02/12/2016   Normocytic anemia 02/12/2016   NSTEMI (non-ST elevated myocardial infarction) (HCC) 02/08/2016   Essential hypertension 02/10/2012    PCP:  Anne Ng NP  REFERRING PROVIDER: Persons, [place], [place]  REFERRING DIAG: [phone] (ICD-10-CM) - Acute pain of left knee  THERAPY DIAG:  Acute pain of left knee  Muscle weakness (generalized)  Difficulty in walking, not elsewhere classified  Localized edema  Rationale for Evaluation and Treatment: Rehabilitation  ONSET DATE: Sept 10th or 11th per Pt, 2024.   SUBJECTIVE:   SUBJECTIVE STATEMENT: Reported a fall down stairs onto Lt knee.  Pt indicated symptoms have improved but having pain in Lt knee across front knee.  Pt indicated use cane to help with balance.   Pt indicated having some trouble sleeping at times due to symptoms.  Reported difficulty with stairs getting in/out of home.  Pt indicated restricted in work and exercise activity due to symptoms.   Denied previous history of Lt knee symptoms.   Has brace that she bought,wears it sometimes.   PERTINENT HISTORY: CAD, hyperlipidemia, HTN   PAIN:  NPRS scale: 5/10 current/at worst Pain location: Lt knee  Pain description: throbbing, piercing Aggravating factors: transfers after sitting, WB activity, prolonged sitting, stairs Relieving factors: OTC medicine, omega XL  PRECAUTIONS: None  WEIGHT  BEARING RESTRICTIONS: No  FALLS:  Has patient fallen in last 6 months? Yes. 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    PLOF: Independent, read/write,  has 02 fitness membership.   PATIENT GOALS: Reduce pain, get back to work/hobbies/exercise   OBJECTIVE:   DIAGNOSTIC FINDINGS:  10/27/2022  chart review: IMPRESSION: 1. Torn ACL. 2. Fracture of the posterior rim of the medial tibial plateau with minimal impaction of the posterior rim fragment. 3. Bone bruising or subtle impaction fracture posteriorly in the lateral tibial plateau. 4. Diminutive size of the medial meniscus, without a well-defined flap or bucket-handle tear identified. 5. Large knee effusion. 6. Small Baker's cyst. 7. Mild semimembranosus-tibial collateral ligament bursitis. 8. Edema in the popliteus muscle favoring strain. 9. Edema around the MCL without an overt tear identified. 10. Abnormal edema tracks along the medial patellar retinaculum and medial patellofemoral ligament but without tear of either structure definitively seen. 11. Circumferential but mainly anterior subcutaneous edema around the knee.  PATIENT SURVEYS:  10/27/2022 FOTO intake:  58  predicted:  69  COGNITION: 10/27/2022 Overall cognitive status: WFL    SENSATION: 10/27/2022 No testing today.   EDEMA:  10/27/2022 Localized edema Lt knee  MUSCLE LENGTH: 10/27/2022 Not tested today.   POSTURE:  10/27/2022 Weight shift to Rt leg in stance.   PALPATION: 10/27/2022 Tenderness back of knee in extension to light touch.  No other specific tenderness in clinic today  LOWER EXTREMITY ROM:   ROM Right 10/27/2022 Left 10/27/2022  Hip flexion    Hip extension    Hip abduction    Hip adduction    Hip internal rotation    Hip external rotation    Knee flexion  130 AROM supine heel slide c pain  Knee extension  -5 AROM in seated LAQ  c pain  Ankle dorsiflexion    Ankle plantarflexion    Ankle inversion    Ankle eversion     (Blank rows = not tested)  LOWER EXTREMITY MMT:  MMT Right 10/27/2022 Left 10/27/2022  Hip flexion 5/5 5/5  Hip extension    Hip abduction    Hip adduction    Hip internal rotation    Hip external rotation    Knee flexion 5/5 4/5  Knee extension 5/5 4/5  Ankle dorsiflexion 5/5 5/5   Ankle plantarflexion    Ankle inversion    Ankle eversion     (Blank rows = not tested)  LOWER EXTREMITY SPECIAL TESTS:  10/27/2022 No specific testing today  FUNCTIONAL TESTS:  10/27/2022 18 inch chair transfer: able to perform on 1st try without UE, deviation Rt leg WB.  Lt SLS: unable unassisted Rt SLS: < 3 seconds attempt  GAIT: 10/27/2022 SPC use in Rt hand with reduced stance on Lt, weight shift to Rt with decreased gait speed.  TODAY'S TREATMENT                                                                          DATE:10/27/2022 Therex:    HEP instruction/performance c cues for techniques, handout provided.  Trial set performed of each for comprehension and symptom assessment.  See below for exercise list  Vasopneumatic Device 10 mins Lt knee medium compression 34 deg in elevation.   PATIENT EDUCATION:  10/27/2022 Education details: HEP, POC Person educated: Patient Education method: Programmer, multimedia, Demonstration, Verbal cues, and Handouts Education comprehension: verbalized understanding, returned demonstration, and verbal cues required  HOME EXERCISE PROGRAM: Access Code: [email or web address] URL: [email or web address] Date: 10/27/2022 Prepared by: Chyrel Masson  Exercises - Seated Long Arc Quad  - 3-5 x daily - 7 x weekly - 1-2 sets - 10 reps - 2 hold - Supine Quadricep Sets  - 3-5 x daily - 7 x weekly - 1 sets - 10 reps - 5 hold - Seated Quad Set (Mirrored)  - 3-5 x daily - 7 x weekly - 1 sets - 10 reps - 5 hold - Supine Knee Extension Mobilization with Weight (Mirrored)  - 4-5 x daily - 7 x weekly - 1 sets - 1 reps - to tolerance up to 15 mins hold - Seated Straight Leg Heel Taps  - 1-2 x daily - 7 x weekly - 3 sets - 10 reps  ASSESSMENT:  CLINICAL IMPRESSION: Patient is a 72 y.o. who  comes to clinic with complaints of Lt knee pain with mobility, strength and movement coordination deficits that impair their ability to perform usual daily and recreational functional activities without increase difficulty/symptoms at this time.  Patient to benefit from skilled PT services to address impairments and limitations to improve to previous level of function without restriction secondary to condition.   OBJECTIVE IMPAIRMENTS: Abnormal gait, decreased activity tolerance, decreased balance, decreased coordination, decreased endurance, decreased mobility, difficulty walking, decreased ROM, decreased strength, hypomobility, increased edema, increased fascial restrictions, impaired perceived functional ability, impaired flexibility, improper body mechanics, and pain.   ACTIVITY LIMITATIONS: carrying, lifting, bending, sitting, standing, squatting, sleeping, stairs, transfers, bed mobility, and locomotion level  PARTICIPATION LIMITATIONS: meal prep, cleaning, laundry, interpersonal relationship, driving, shopping, community activity, and occupation  PERSONAL FACTORS:  no specific factors  are also affecting patient's functional outcome.   REHAB POTENTIAL: Good  CLINICAL DECISION MAKING: Stable/uncomplicated  EVALUATION COMPLEXITY: Low   GOALS: Goals reviewed with patient? Yes  SHORT TERM GOALS: (target date for Short term goals are 3 weeks 11/17/2022)   1.  Patient will demonstrate independent use of home exercise program to maintain progress from in clinic treatments.  Goal status: New  LONG TERM GOALS: (target dates for all long term goals are 10 weeks  01/05/2023 )   1. Patient will demonstrate/report pain at worst less than or equal to 2/10 to facilitate minimal limitation in daily activity secondary to pain symptoms.  Goal status: New   2. Patient will demonstrate independent use of home exercise program to facilitate ability to maintain/progress functional gains from skilled  physical therapy services.  Goal status: New   3. Patient will demonstrate FOTO outcome > or = 69 % to indicate reduced disability due to condition.  Goal status: New   4.  Patient will demonstrate Lt LE MMT 5/5 throughout to faciltiate usual transfers, stairs, squatting at [facility] for daily life.   Goal status: New   5.  Patient will demonstrate independent ambulation community distances > 500 ft s deviation.  Goal status: New   6.  Patient will demonstrate Lt knee AROM 0-130 s symptoms to facilitate usual mobility at PLOF.  Goal status: New   7.  Patient will demonstrate ascending/descending stairs reciprocally without UE assist for living entry/exit.  Goal Status: New   PLAN:  PT FREQUENCY: 1-2x/week  PT DURATION: 10 weeks  PLANNED INTERVENTIONS: Can include 56433- PT Re-evaluation, 97110-Therapeutic exercises, 97530- Therapeutic activity, [id number]- Neuromuscular re-education, 97535- Self Care, 97140- Manual therapy, [phone]- Gait training, [phone]- Orthotic Fit/training, [phone]- Canalith repositioning, [id number]- Aquatic Therapy, 97014- Electrical stimulation (unattended), [id number]- Electrical stimulation (manual), [id number]- Vasopneumatic device, [id number]- Ultrasound, [id number]- Traction (mechanical), [id number]- Ionotophoresis 4mg /ml Dexamethasone, Patient/Family education, Balance training, Stair training, Taping, Dry Needling, Joint mobilization, Joint manipulation, Spinal manipulation, Spinal mobilization, Scar mobilization, Vestibular training, Visual/preceptual remediation/compensation, DME instructions, Cryotherapy, and Moist heat.  All performed as medically necessary.  All included unless contraindicated  PLAN FOR NEXT SESSION: Review HEP knowledge/results.   Chyrel Masson, PT, DPT, OCS, ATC 10/27/22  11:37 AM    Date of referral: 10/12/2022 Referring provider: Persons, [place], [place] Referring diagnosis? M25.562 (ICD-10-CM) - Acute pain of left knee Treatment diagnosis? (if different than  referring diagnosis) M25.562 (ICD-10-CM) - Acute pain of left knee  What was this (referring dx) caused by? Thana Ates of Condition: Initial Onset (within last 3 months)   Laterality: Lt  Current Functional Measure Score: FOTO 58  Objective measurements identify impairments when they are compared to normal values, the uninvolved extremity, and prior level of function.  [x]  Yes  []  No  Objective assessment of functional ability: Moderate functional limitations   Briefly describe symptoms: Symptoms noted in Lt knee during the day with sitting, standing, sleeping, WB activity.  Large limitation in functional activity and entry/exit on stairs for living environment.  Unable to work or exercise at this time.  Has to use cane in ambulation for safety.  Moderate symptom level noted.   How did symptoms start: Related to fall down stairs onto Lt knee  Average pain intensity:  Last 24 hours:  5/10   Past week: 5/10  How often does the pt experience symptoms? Frequently  How much have the symptoms interfered with usual daily activities? Extremely  How has condition changed since care began at this facility? NA - initial visit  In general, how is the patients overall health? Good   BACK PAIN (STarT Back Screening Tool) No

## 2022-10-27 NOTE — Therapy (Deleted)
OUTPATIENT PHYSICAL THERAPY LOWER EXTREMITY EVALUATION   Patient Name: Diamond Hughes MRN: [id number] DOB:[birth year], 72 y.o., female Today's Date: 10/27/2022  END OF SESSION:   Past Medical History:  Diagnosis Date   CAD (coronary artery disease)    a. 2000 s/p MI and RCA PCI [facility]);  b. 02/2016 NSTEMI/PCI: LM nl, LAD nl, LCX 100p (3.5x18 Resolute Onyx DES), OM1/3 small, RCA 70p aneurysmal, 69m/d ISR-->attempted PCI, could not cross w/ wire-->med rx, EF 45-50%.   Hyperlipidemia    Hypertension    Ischemic cardiomyopathy    a. 02/2016 Echo: EF 45-50%, basal-mid inferior/posterior HK, Gr1 DD, triv MR, nl LA size, nl RV fxn.   Normocytic anemia    Past Surgical History:  Procedure Laterality Date   ANGIOPLASTY  2000   CORONARY STENT INTERVENTION N/A 02/10/2016   Procedure: Coronary Stent Intervention;  Surgeon: Lyn Records, MD;  Location: [facility] INVASIVE CV LAB;  Service: Cardiovascular;  Laterality: N/A;   LEFT HEART CATH AND CORONARY ANGIOGRAPHY N/A 02/10/2016   Procedure: Left Heart Cath and Coronary Angiography;  Surgeon: Lyn Records, MD;  Location: [facility] INVASIVE CV LAB;  Service: Cardiovascular;  Laterality: N/A;   Patient Active Problem List   Diagnosis Date Noted   Gouty arthritis of right great toe 10/22/2020   Overweight (BMI 25.0-29.9) 04/18/2020   Type 2 diabetes mellitus with hyperglycemia, without long-term current use of insulin (HCC) 03/14/2020   Sciatica without lumbago 02/01/2019   Acquired hypothyroidism 12/06/2017   Ischemic cardiomyopathy 02/20/2016   Hyperlipidemia associated with type 2 diabetes mellitus (HCC) 02/12/2016   CAD (coronary artery disease) 02/12/2016   Normocytic anemia 02/12/2016   NSTEMI (non-ST elevated myocardial infarction) (HCC) 02/08/2016   Essential hypertension 02/10/2012    PCP: Anne Ng, NP  REFERRING PROVIDER: Persons, [place], [place]  REFERRING DIAG: [phone] (ICD-10-CM) - Acute pain of left knee  THERAPY DIAG:   No diagnosis found.  Rationale for Evaluation and Treatment: Rehabilitation  ONSET DATE: ***  SUBJECTIVE:   SUBJECTIVE STATEMENT: ***  PERTINENT HISTORY: From H&P: She said a month ago she fell down some stairs came down on her knee. She denies any popping or instability feelings but had quite a bit of pain.  PAIN:  Are you having pain? {OPRCPAIN:27236}  PRECAUTIONS: {Therapy precautions:24002}  RED FLAGS: {PT Red Flags:29287}   WEIGHT BEARING RESTRICTIONS: {Yes ***/No:24003}  FALLS:  Has patient fallen in last 6 months? {fallsyesno:27318}  LIVING ENVIRONMENT: Lives with: {OPRC lives with:25569::"lives with their family"} Lives in: {Lives in:25570} Stairs: {opstairs:27293} Has following equipment at home: {Assistive devices:23999}  OCCUPATION: ***  PLOF: {PLOF:24004}  PATIENT GOALS: ***  NEXT MD VISIT: ***  OBJECTIVE:  Note: Objective measures were completed at Evaluation unless otherwise noted.  DIAGNOSTIC FINDINGS: L knee MRI 10/02/22 IMPRESSION: 1. Torn ACL. 2. Fracture of the posterior rim of the medial tibial plateau with minimal impaction of the posterior rim fragment. 3. Bone bruising or subtle impaction fracture posteriorly in the lateral tibial plateau. 4. Diminutive size of the medial meniscus, without a well-defined flap or bucket-handle tear identified. 5. Large knee effusion. 6. Small Baker's cyst. 7. Mild semimembranosus-tibial collateral ligament bursitis. 8. Edema in the popliteus muscle favoring strain. 9. Edema around the MCL without an overt tear identified. 10. Abnormal edema tracks along the medial patellar retinaculum and medial patellofemoral ligament but without tear of either structure definitively seen. 11. Circumferential but mainly anterior subcutaneous edema around the knee.  PATIENT SURVEYS:  {rehab surveys:24030}  COGNITION:  Overall cognitive status: {cognition:24006}     SENSATION: {sensation:27233}  EDEMA:   {edema:24020}  MUSCLE LENGTH: Hamstrings: Right *** deg; Left *** deg Maisie Fus test: Right *** deg; Left *** deg  POSTURE: {posture:25561}  PALPATION: ***  LOWER EXTREMITY ROM:  {AROM/PROM:27142} ROM Right eval Left eval  Hip flexion    Hip extension    Hip abduction    Hip adduction    Hip internal rotation    Hip external rotation    Knee flexion    Knee extension    Ankle dorsiflexion    Ankle plantarflexion    Ankle inversion    Ankle eversion     (Blank rows = not tested)  LOWER EXTREMITY MMT:  MMT Right eval Left eval  Hip flexion    Hip extension    Hip abduction    Hip adduction    Hip internal rotation    Hip external rotation    Knee flexion    Knee extension    Ankle dorsiflexion    Ankle plantarflexion    Ankle inversion    Ankle eversion     (Blank rows = not tested)  LOWER EXTREMITY SPECIAL TESTS:  {LEspecialtests:26242}  FUNCTIONAL TESTS:  {Functional tests:24029}  GAIT: Distance walked: *** Assistive device utilized: {Assistive devices:23999} Level of assistance: {Levels of assistance:24026} Comments: ***   TODAY'S TREATMENT:                                                                                                                              DATE: ***    PATIENT EDUCATION:  Education details: *** Person educated: {Person educated:25204} Education method: {Education Method:25205} Education comprehension: {Education Comprehension:25206}  HOME EXERCISE PROGRAM: ***  ASSESSMENT:  CLINICAL IMPRESSION: Patient is a *** y.o. *** who was seen today for physical therapy evaluation and treatment for ***.   OBJECTIVE IMPAIRMENTS: {opptimpairments:25111}.   ACTIVITY LIMITATIONS: {activitylimitations:27494}  PARTICIPATION LIMITATIONS: {participationrestrictions:25113}  PERSONAL FACTORS: {Personal factors:25162} are also affecting patient's functional outcome.   REHAB POTENTIAL: {rehabpotential:25112}  CLINICAL  DECISION MAKING: {clinical decision making:25114}  EVALUATION COMPLEXITY: {Evaluation complexity:25115}   GOALS: Goals reviewed with patient? {yes/no:20286}  SHORT TERM GOALS: Target date: *** *** Baseline: Goal status: INITIAL  2.  *** Baseline:  Goal status: INITIAL  3.  *** Baseline:  Goal status: INITIAL  4.  *** Baseline:  Goal status: INITIAL  5.  *** Baseline:  Goal status: INITIAL  6.  *** Baseline:  Goal status: INITIAL  LONG TERM GOALS: Target date: ***  *** Baseline:  Goal status: INITIAL  2.  *** Baseline:  Goal status: INITIAL  3.  *** Baseline:  Goal status: INITIAL  4.  *** Baseline:  Goal status: INITIAL  5.  *** Baseline:  Goal status: INITIAL  6.  *** Baseline:  Goal status: INITIAL   PLAN:  PT FREQUENCY: {rehab frequency:25116}  PT DURATION: {rehab duration:25117}  PLANNED INTERVENTIONS: {rehab planned interventions:25118::"97110-Therapeutic exercises","97530- Therapeutic [phone]- Neuromuscular re-education","97535-  Self ZOXW","96045- Manual therapy"}  PLAN FOR NEXT SESSION: ***   Lonnie Reth April Ma L Jeffrie Stander, PT 10/27/2022, 7:56 AM

## 2022-11-03 ENCOUNTER — Ambulatory Visit: Payer: Medicare Other | Admitting: Physical Therapy

## 2022-11-03 ENCOUNTER — Encounter: Payer: Self-pay | Admitting: Physical Therapy

## 2022-11-03 DIAGNOSIS — M6281 Muscle weakness (generalized): Secondary | ICD-10-CM

## 2022-11-03 DIAGNOSIS — R600 Localized edema: Secondary | ICD-10-CM

## 2022-11-03 DIAGNOSIS — M25562 Pain in left knee: Secondary | ICD-10-CM | POA: Diagnosis not present

## 2022-11-03 DIAGNOSIS — R262 Difficulty in walking, not elsewhere classified: Secondary | ICD-10-CM | POA: Diagnosis not present

## 2022-11-03 NOTE — Therapy (Signed)
OUTPATIENT PHYSICAL THERAPY TREATMENT   Patient Name: Diamond Hughes MRN: [id number] DOB:Oct 12, 1950, 72 y.o., female Today's Date: 11/03/2022  END OF SESSION:  PT End of Session - 11/03/22 1532     Visit Number 2    Number of Visits 20    Date for PT Re-Evaluation 01/05/23    Authorization Type [employer] $20 copay    Progress Note Due on Visit 10    PT Start Time 1517    PT Stop Time 1556    PT Time Calculation (min) 39 min    Activity Tolerance Patient tolerated treatment well    Behavior During Therapy WFL for tasks assessed/performed              Past Medical History:  Diagnosis Date   CAD (coronary artery disease)    a. 2000 s/p MI and RCA PCI [facility]);  b. 02/2016 NSTEMI/PCI: LM nl, LAD nl, LCX 100p (3.5x18 Resolute Onyx DES), OM1/3 small, RCA 70p aneurysmal, 17m/d ISR-->attempted PCI, could not cross w/ wire-->med rx, EF 45-50%.   Hyperlipidemia    Hypertension    Ischemic cardiomyopathy    a. 02/2016 Echo: EF 45-50%, basal-mid inferior/posterior HK, Gr1 DD, triv MR, nl LA size, nl RV fxn.   Normocytic anemia    Past Surgical History:  Procedure Laterality Date   ANGIOPLASTY  2000   CORONARY STENT INTERVENTION N/A 02/10/2016   Procedure: Coronary Stent Intervention;  Surgeon: Lyn Records, MD;  Location: [facility] INVASIVE CV LAB;  Service: Cardiovascular;  Laterality: N/A;   LEFT HEART CATH AND CORONARY ANGIOGRAPHY N/A 02/10/2016   Procedure: Left Heart Cath and Coronary Angiography;  Surgeon: Lyn Records, MD;  Location: [facility] INVASIVE CV LAB;  Service: Cardiovascular;  Laterality: N/A;   Patient Active Problem List   Diagnosis Date Noted   Gouty arthritis of right great toe 10/22/2020   Overweight (BMI 25.0-29.9) 04/18/2020   Type 2 diabetes mellitus with hyperglycemia, without long-term current use of insulin (HCC) 03/14/2020   Sciatica without lumbago 02/01/2019   Acquired hypothyroidism 12/06/2017   Ischemic cardiomyopathy 02/20/2016   Hyperlipidemia  associated with type 2 diabetes mellitus (HCC) 02/12/2016   CAD (coronary artery disease) 02/12/2016   Normocytic anemia 02/12/2016   NSTEMI (non-ST elevated myocardial infarction) (HCC) 02/08/2016   Essential hypertension 02/10/2012    PCP:  Anne Ng NP  REFERRING PROVIDER: Persons, [place], [place]  REFERRING DIAG: [phone] (ICD-10-CM) - Acute pain of left knee  THERAPY DIAG:  Acute pain of left knee  Muscle weakness (generalized)  Difficulty in walking, not elsewhere classified  Localized edema  Rationale for Evaluation and Treatment: Rehabilitation  ONSET DATE: Sept 10th or 11th per Pt, 2024.   SUBJECTIVE:   SUBJECTIVE STATEMENT:  Everything is going well, but since yesterday its been hard for me to lay down in the bed with the leg straight. Its still sore in the front. Its getting a whole lot better, HEP is going well no questions. I have sciatic nerve pain that I'm dealing with too, when my knee and that act up at the same time its painful but I feel like I need to be moving around and doing things. I go up and down 3 flights of steps every day, I really feel isolated and stuck in my house.    EVAL:Reported a fall down stairs onto Lt knee.  Pt indicated symptoms have improved but having pain in Lt knee across front knee.  Pt indicated use cane to  help with balance.   Pt indicated having some trouble sleeping at times due to symptoms.  Reported difficulty with stairs getting in/out of home.  Pt indicated restricted in work and exercise activity due to symptoms.   Denied previous history of Lt knee symptoms.   Has brace that she bought,wears it sometimes.   PERTINENT HISTORY: CAD, hyperlipidemia, HTN   PAIN:  NPRS scale: 0/10 now, does throb occasionally  Pain location: Lt knee  Pain description: throbbing, piercing Aggravating factors: transfers after sitting, WB activity, prolonged sitting, stairs Relieving factors: OTC medicine, omega XL  PRECAUTIONS:  None  WEIGHT BEARING RESTRICTIONS: No  FALLS:  Has patient fallen in last 6 months? Yes. 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    PLOF: Independent, read/write,  has 02 fitness membership.   PATIENT GOALS: Reduce pain, get back to work/hobbies/exercise   OBJECTIVE:   DIAGNOSTIC FINDINGS:  10/27/2022 chart review: IMPRESSION: 1. Torn ACL. 2. Fracture of the posterior rim of the medial tibial plateau with minimal impaction of the posterior rim fragment. 3. Bone bruising or subtle impaction fracture posteriorly in the lateral tibial plateau. 4. Diminutive size of the medial meniscus, without a well-defined flap or bucket-handle tear identified. 5. Large knee effusion. 6. Small Baker's cyst. 7. Mild semimembranosus-tibial collateral ligament bursitis. 8. Edema in the popliteus muscle favoring strain. 9. Edema around the MCL without an overt tear identified. 10. Abnormal edema tracks along the medial patellar retinaculum and medial patellofemoral ligament but without tear of either structure definitively seen. 11. Circumferential but mainly anterior subcutaneous edema around the knee.  PATIENT SURVEYS:  10/27/2022 FOTO intake:  58  predicted:  69  COGNITION: 10/27/2022 Overall cognitive status: WFL    SENSATION: 10/27/2022 No testing today.   EDEMA:  10/27/2022 Localized edema Lt knee  MUSCLE LENGTH: 10/27/2022 Not tested today.   POSTURE:  10/27/2022 Weight shift to Rt leg in stance.   PALPATION: 10/27/2022 Tenderness back of knee in extension to light touch.  No other specific tenderness in clinic today  LOWER EXTREMITY ROM:   ROM Right 10/27/2022 Left 10/27/2022  Hip flexion    Hip extension    Hip abduction    Hip adduction    Hip internal rotation    Hip external  rotation    Knee flexion  130 AROM supine heel slide c pain  Knee extension  -5 AROM in seated LAQ  c pain  Ankle dorsiflexion    Ankle plantarflexion    Ankle inversion    Ankle eversion     (Blank rows = not tested)  LOWER EXTREMITY MMT:  MMT Right 10/27/2022 Left 10/27/2022  Hip flexion 5/5 5/5  Hip extension    Hip abduction    Hip adduction    Hip internal rotation    Hip external rotation    Knee flexion 5/5 4/5  Knee extension 5/5 4/5  Ankle dorsiflexion 5/5 5/5  Ankle plantarflexion    Ankle inversion    Ankle eversion     (Blank rows = not tested)  LOWER EXTREMITY SPECIAL TESTS:  10/27/2022 No specific testing today  FUNCTIONAL TESTS:  10/27/2022 18 inch chair transfer: able to perform on 1st try without UE, deviation Rt leg WB.  Lt SLS: unable unassisted Rt SLS: < 3 seconds attempt  GAIT: 10/27/2022 SPC use in Rt hand  with reduced stance on Lt, weight shift to Rt with decreased gait speed.                                                                                                                                                                         TODAY'S TREATMENT                                                                          DATE:  11/03/22  TherEx  TM speed 1.5 x8 minutes direct PT supervision LAQs 3# 10x3 second holds L LE  Bridges 12x3 second holds Sidelying clams yellow TB x12 B SLRs 0# x10 B STS with 2 inch box under R LE for improved L wt shift x10 no UEs HS stretches L LE 3x30 seconds  Standing hip ABD yellow TB x10 B  Standing hip extension yellow TB x10 B      10/27/2022 Therex:    HEP instruction/performance c cues for techniques, handout provided.  Trial set performed of each for comprehension and symptom assessment.  See below for exercise list  Vasopneumatic Device 10 mins Lt knee medium compression 34 deg in elevation.   PATIENT EDUCATION:  10/27/2022 Education details: HEP, POC Person educated:  Patient Education method: Programmer, multimedia, Demonstration, Verbal cues, and Handouts Education comprehension: verbalized understanding, returned demonstration, and verbal cues required  HOME EXERCISE PROGRAM:  Access Code: [email or web address] URL: [email or web address] Date: 11/03/2022 Prepared by: Nedra Hai  Exercises - Seated Long Arc Quad  - 3-5 x daily - 7 x weekly - 1-2 sets - 10 reps - 2 hold - Supine Quadricep Sets  - 3-5 x daily - 7 x weekly - 1 sets - 10 reps - 5 hold - Seated Quad Set (Mirrored)  - 3-5 x daily - 7 x weekly - 1 sets - 10 reps - 5 hold - Supine Knee Extension Mobilization with Weight (Mirrored)  - 4-5 x daily - 7 x weekly - 1 sets - 1 reps - to tolerance up to 15 mins hold - Seated Straight Leg Heel Taps  - 1-2 x daily - 7 x weekly - 3 sets - 10 reps - Supine Bridge  - 1-2 x daily - 7 x weekly - 3 sets - 10 reps - 3 seconds  hold - Walking on Treadmill  - 1 x daily - 7 x weekly - 1 sets - 1 reps - 10 minutes  hold - Clamshell with Resistance  -  1 x daily - 7 x weekly - 3 sets - 10 reps - 1 second  hold  ASSESSMENT:  CLINICAL IMPRESSION:  Pt arrives today really feeling a lot better, requesting more exercises to do at home and also requested to try TM today to assess tolerance for possible use at her apt gym. Did well with all interventions today. Adjusted cane height as she had it at max setting/not appropriate for her height. Otherwise worked on strength/ROM as tolerated and updated HEP as appropriate. Will continue to progress as tolerated.    OBJECTIVE IMPAIRMENTS: Abnormal gait, decreased activity tolerance, decreased balance, decreased coordination, decreased endurance, decreased mobility, difficulty walking, decreased ROM, decreased strength, hypomobility, increased edema, increased fascial restrictions, impaired perceived functional ability, impaired flexibility, improper body mechanics, and pain.   ACTIVITY LIMITATIONS: carrying, lifting, bending,  sitting, standing, squatting, sleeping, stairs, transfers, bed mobility, and locomotion level  PARTICIPATION LIMITATIONS: meal prep, cleaning, laundry, interpersonal relationship, driving, shopping, community activity, and occupation  PERSONAL FACTORS:  no specific factors  are also affecting patient's functional outcome.   REHAB POTENTIAL: Good  CLINICAL DECISION MAKING: Stable/uncomplicated  EVALUATION COMPLEXITY: Low   GOALS: Goals reviewed with patient? Yes  SHORT TERM GOALS: (target date for Short term goals are 3 weeks 11/17/2022)   1.  Patient will demonstrate independent use of home exercise program to maintain progress from in clinic treatments.  Goal status: New  LONG TERM GOALS: (target dates for all long term goals are 10 weeks  01/05/2023 )   1. Patient will demonstrate/report pain at worst less than or equal to 2/10 to facilitate minimal limitation in daily activity secondary to pain symptoms.  Goal status: New   2. Patient will demonstrate independent use of home exercise program to facilitate ability to maintain/progress functional gains from skilled physical therapy services.  Goal status: New   3. Patient will demonstrate FOTO outcome > or = 69 % to indicate reduced disability due to condition.  Goal status: New   4.  Patient will demonstrate Lt LE MMT 5/5 throughout to faciltiate usual transfers, stairs, squatting at [facility] for daily life.   Goal status: New   5.  Patient will demonstrate independent ambulation community distances > 500 ft s deviation.  Goal status: New   6.  Patient will demonstrate Lt knee AROM 0-130 s symptoms to facilitate usual mobility at PLOF.  Goal status: New   7.  Patient will demonstrate ascending/descending stairs reciprocally without UE assist for living entry/exit.  Goal Status: New   PLAN:  PT FREQUENCY: 1-2x/week  PT DURATION: 10 weeks  PLANNED INTERVENTIONS: Can include 47425- PT Re-evaluation,  97110-Therapeutic exercises, 97530- Therapeutic activity, [id number]- Neuromuscular re-education, 97535- Self Care, 97140- Manual therapy, [phone]- Gait training, [phone]- Orthotic Fit/training, [phone]- Canalith repositioning, [id number]- Aquatic Therapy, 97014- Electrical stimulation (unattended), [id number]- Electrical stimulation (manual), [id number]- Vasopneumatic device, [id number]- Ultrasound, [id number]- Traction (mechanical), [id number]- Ionotophoresis 4mg /ml Dexamethasone, Patient/Family education, Balance training, Stair training, Taping, Dry Needling, Joint mobilization, Joint manipulation, Spinal manipulation, Spinal mobilization, Scar mobilization, Vestibular training, Visual/preceptual remediation/compensation, DME instructions, Cryotherapy, and Moist heat.  All performed as medically necessary.  All included unless contraindicated  PLAN FOR NEXT SESSION: Review HEP knowledge/results. Continue progressing strength/conditioning as tolerance/pain allows   Nedra Hai, PT, DPT 11/03/22 3:59 PM     Date of referral: 10/12/2022 Referring provider: Persons, [place], [place] Referring diagnosis? M25.562 (ICD-10-CM) - Acute pain of left knee Treatment diagnosis? (if different than referring diagnosis) M25.562 (ICD-10-CM) - Acute pain of left  knee  What was this (referring dx) caused by? Thana Ates of Condition: Initial Onset (within last 3 months)   Laterality: Lt  Current Functional Measure Score: FOTO 58  Objective measurements identify impairments when they are compared to normal values, the uninvolved extremity, and prior level of function.  [x]  Yes  []  No  Objective assessment of functional ability: Moderate functional limitations   Briefly describe symptoms: Symptoms noted in Lt knee during the day with sitting, standing, sleeping, WB activity.  Large limitation in functional activity and entry/exit on stairs for living environment.  Unable to work or exercise at this time.  Has to use cane in ambulation for  safety.  Moderate symptom level noted.   How did symptoms start: Related to fall down stairs onto Lt knee  Average pain intensity:  Last 24 hours:  5/10   Past week: 5/10  How often does the pt experience symptoms? Frequently  How much have the symptoms interfered with usual daily activities? Extremely  How has condition changed since care began at this facility? NA - initial visit  In general, how is the patients overall health? Good   BACK PAIN (STarT Back Screening Tool) No

## 2022-11-04 ENCOUNTER — Inpatient Hospital Stay: Admission: RE | Admit: 2022-11-04 | Payer: Medicare Other | Source: Ambulatory Visit

## 2022-11-09 ENCOUNTER — Encounter: Payer: Self-pay | Admitting: Rehabilitative and Restorative Service Providers"

## 2022-11-09 ENCOUNTER — Ambulatory Visit: Payer: Medicare Other | Admitting: Rehabilitative and Restorative Service Providers"

## 2022-11-09 DIAGNOSIS — M25562 Pain in left knee: Secondary | ICD-10-CM | POA: Diagnosis not present

## 2022-11-09 DIAGNOSIS — R262 Difficulty in walking, not elsewhere classified: Secondary | ICD-10-CM | POA: Diagnosis not present

## 2022-11-09 DIAGNOSIS — R600 Localized edema: Secondary | ICD-10-CM | POA: Diagnosis not present

## 2022-11-09 DIAGNOSIS — M6281 Muscle weakness (generalized): Secondary | ICD-10-CM

## 2022-11-09 NOTE — Therapy (Signed)
OUTPATIENT PHYSICAL THERAPY TREATMENT   Patient Name: Diamond Hughes MRN: [id number] DOB:01-26-1950, 72 y.o., female Today's Date: 11/09/2022  END OF SESSION:  PT End of Session - 11/09/22 1451     Visit Number 3    Number of Visits 20    Date for PT Re-Evaluation 01/05/23    Authorization Type [employer] $20 copay    Progress Note Due on Visit 10    PT Start Time 1451    PT Stop Time 1545    PT Time Calculation (min) 54 min    Activity Tolerance Patient tolerated treatment well    Behavior During Therapy WFL for tasks assessed/performed               Past Medical History:  Diagnosis Date   CAD (coronary artery disease)    a. 2000 s/p MI and RCA PCI [facility]);  b. 02/2016 NSTEMI/PCI: LM nl, LAD nl, LCX 100p (3.5x18 Resolute Onyx DES), OM1/3 small, RCA 70p aneurysmal, 49m/d ISR-->attempted PCI, could not cross w/ wire-->med rx, EF 45-50%.   Hyperlipidemia    Hypertension    Ischemic cardiomyopathy    a. 02/2016 Echo: EF 45-50%, basal-mid inferior/posterior HK, Gr1 DD, triv MR, nl LA size, nl RV fxn.   Normocytic anemia    Past Surgical History:  Procedure Laterality Date   ANGIOPLASTY  2000   CORONARY STENT INTERVENTION N/A 02/10/2016   Procedure: Coronary Stent Intervention;  Surgeon: Lyn Records, MD;  Location: [facility] INVASIVE CV LAB;  Service: Cardiovascular;  Laterality: N/A;   LEFT HEART CATH AND CORONARY ANGIOGRAPHY N/A 02/10/2016   Procedure: Left Heart Cath and Coronary Angiography;  Surgeon: Lyn Records, MD;  Location: [facility] INVASIVE CV LAB;  Service: Cardiovascular;  Laterality: N/A;   Patient Active Problem List   Diagnosis Date Noted   Gouty arthritis of right great toe 10/22/2020   Overweight (BMI 25.0-29.9) 04/18/2020   Type 2 diabetes mellitus with hyperglycemia, without long-term current use of insulin (HCC) 03/14/2020   Sciatica without lumbago 02/01/2019   Acquired hypothyroidism 12/06/2017   Ischemic cardiomyopathy 02/20/2016   Hyperlipidemia  associated with type 2 diabetes mellitus (HCC) 02/12/2016   CAD (coronary artery disease) 02/12/2016   Normocytic anemia 02/12/2016   NSTEMI (non-ST elevated myocardial infarction) (HCC) 02/08/2016   Essential hypertension 02/10/2012    PCP:  Anne Ng NP  REFERRING PROVIDER: Persons, [place], [place]  REFERRING DIAG: [phone] (ICD-10-CM) - Acute pain of left knee  THERAPY DIAG:  Acute pain of left knee  Muscle weakness (generalized)  Difficulty in walking, not elsewhere classified  Localized edema  Rationale for Evaluation and Treatment: Rehabilitation  ONSET DATE: Sept 10th or 11th per Pt, 2024.   SUBJECTIVE:   SUBJECTIVE STATEMENT: Pt indicated symptoms at worst 4-5/10, noted with driving car with clutch.   Pt indicated leg can get flat with sleeping and also did walking independent today.   PERTINENT HISTORY: CAD, hyperlipidemia, HTN   PAIN:  NPRS scale: up to 4-5/10 Pain location: Lt knee  Pain description: throbbing, piercing Aggravating factors: transfers after sitting, WB activity, prolonged sitting, stairs Relieving factors: OTC medicine, omega XL  PRECAUTIONS: None  WEIGHT BEARING RESTRICTIONS: No  FALLS:  Has patient fallen in last 6 months? Yes. 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    PLOF: Independent, read/write,  has 02 fitness membership.   PATIENT GOALS: Reduce pain, get back to work/hobbies/exercise   OBJECTIVE:   DIAGNOSTIC FINDINGS:  10/27/2022 chart review: IMPRESSION: 1. Torn ACL. 2. Fracture of the posterior rim of the medial tibial plateau with minimal impaction of the posterior rim fragment. 3. Bone bruising or subtle impaction fracture posteriorly in the lateral tibial plateau. 4. Diminutive size of the medial meniscus, without a  well-defined flap or bucket-handle tear identified. 5. Large knee effusion. 6. Small Baker's cyst. 7. Mild semimembranosus-tibial collateral ligament bursitis. 8. Edema in the popliteus muscle favoring strain. 9. Edema around the MCL without an overt tear identified. 10. Abnormal edema tracks along the medial patellar retinaculum and medial patellofemoral ligament but without tear of either structure definitively seen. 11. Circumferential but mainly anterior subcutaneous edema around the knee.  PATIENT SURVEYS:  10/27/2022 FOTO intake:  58  predicted:  69  COGNITION: 10/27/2022 Overall cognitive status: WFL    SENSATION: 10/27/2022 No testing today.   EDEMA:  10/27/2022 Localized edema Lt knee  MUSCLE LENGTH: 10/27/2022 Not tested today.   POSTURE:  10/27/2022 Weight shift to Rt leg in stance.   PALPATION: 10/27/2022 Tenderness back of knee in extension to light touch.  No other specific tenderness in clinic today  LOWER EXTREMITY ROM:   ROM Right 10/27/2022 Left 10/27/2022  Hip flexion    Hip extension    Hip abduction    Hip adduction    Hip internal rotation    Hip external rotation    Knee flexion  130 AROM supine heel slide c pain  Knee extension  -5 AROM in seated LAQ  c pain  Ankle dorsiflexion    Ankle plantarflexion    Ankle inversion    Ankle eversion     (Blank rows = not tested)  LOWER EXTREMITY MMT:  MMT Right 10/27/2022 Left 10/27/2022  Hip flexion 5/5 5/5  Hip extension    Hip abduction    Hip adduction    Hip internal rotation    Hip external rotation    Knee flexion 5/5 4/5  Knee extension 5/5 4/5  Ankle dorsiflexion 5/5 5/5  Ankle plantarflexion    Ankle inversion    Ankle eversion     (Blank rows = not tested)  LOWER EXTREMITY SPECIAL TESTS:  10/27/2022 No specific testing today  FUNCTIONAL TESTS:  10/27/2022 18 inch chair transfer: able to perform on 1st try without UE, deviation Rt leg WB.  Lt SLS: unable  unassisted Rt SLS: < 3 seconds attempt  GAIT: 11/09/2022:  Independent ambulation with mild WB deviation noted.   10/27/2022 SPC use in Rt hand with reduced stance on Lt, weight shift to Rt with decreased gait speed.  TODAY'S TREATMENT                                                                          DATE: 11/09/2022 Therex:  Nustep Lvl 5 UE/LE 5 mins  Recumbent bike lvl 1, 5 mins (trial for home/gym use) - seat 4 Seated quad set Lt leg 5 sec hold x 10 Seated quad set c SLR slowly bilaterally 2 x 10   Neuro Re-ed Tandem stance on foam 1 min x 2 bilateral with occasional HHA on bar  Tandem ambulation on foam 6 ft x 1 fwd/back, then on floor 10 ft x 4 fwd/back each in // bars with occasional HHA  TherActivity ( to improve stairs, transfers, walking, standing) Leg press double leg 100 lbs x15   , single leg  2 x 15 (performed bilaterally)  43 lbs- cues for gym use and slow lowering focus  Step up over and down WB on Lt leg 4 inch step with single hand on rail x 10  Sit to stand sit 18 inch without UE assist x 10 c slow lowering   TODAY'S TREATMENT                                                                          DATE:  11/03/2022 TherEx TM speed 1.5 x8 minutes direct PT supervision LAQs 3# 10x3 second holds L LE  Bridges 12x3 second holds Sidelying clams yellow TB x12 B SLRs 0# x10 B STS with 2 inch box under R LE for improved L wt shift x10 no UEs HS stretches L LE 3x30 seconds  Standing hip ABD yellow TB x10 B  Standing hip extension yellow TB x10 B   TODAY'S TREATMENT                                                                          DATE: 10/27/2022 Therex:    HEP instruction/performance c cues for techniques, handout provided.  Trial set performed of each for comprehension and symptom  assessment.  See below for exercise list  Vasopneumatic Device 10 mins Lt knee medium compression 34 deg in elevation.   PATIENT EDUCATION:  11/09/2022 Education details: HEP update Person educated: Patient Education method: Programmer, multimedia, Demonstration, Verbal cues, and Handouts Education comprehension: verbalized understanding, returned demonstration, and verbal cues required  HOME EXERCISE PROGRAM: Access Code: [email or web address] URL: [email or web address] Date: 11/09/2022 Prepared by: Chyrel Masson  Exercises - Seated Long Arc Quad  - 3-5 x daily - 7 x weekly - 1-2 sets - 10 reps - 2 hold - Seated Quad Set (Mirrored)  - 3-5 x daily - 7 x weekly - 1 sets - 10 reps - 5 hold -  Seated Straight Leg Heel Taps  - 1-2 x daily - 7 x weekly - 3 sets - 10 reps - Supine Knee Extension Mobilization with Weight (Mirrored)  - 4-5 x daily - 7 x weekly - 1 sets - 1 reps - to tolerance up to 15 mins hold - Supine Bridge  - 1-2 x daily - 7 x weekly - 3 sets - 10 reps - 3 seconds  hold - Clamshell with Resistance  - 1 x daily - 7 x weekly - 3 sets - 10 reps - 1 second  hold - Sit to Stand  - 3 x daily - 7 x weekly - 1 sets - 10 reps  ASSESSMENT:  CLINICAL IMPRESSION: Spent time today in review of some select activity that could be done in gym return in future.  Cues given on techniques of setup and adjustments to avoid any pain aggravation in performance.  Continued strengthening and balance improvements to help functional movement ability.    OBJECTIVE IMPAIRMENTS: Abnormal gait, decreased activity tolerance, decreased balance, decreased coordination, decreased endurance, decreased mobility, difficulty walking, decreased ROM, decreased strength, hypomobility, increased edema, increased fascial restrictions, impaired perceived functional ability, impaired flexibility, improper body mechanics, and pain.   ACTIVITY LIMITATIONS: carrying, lifting, bending, sitting, standing, squatting, sleeping,  stairs, transfers, bed mobility, and locomotion level  PARTICIPATION LIMITATIONS: meal prep, cleaning, laundry, interpersonal relationship, driving, shopping, community activity, and occupation  PERSONAL FACTORS:  no specific factors  are also affecting patient's functional outcome.   REHAB POTENTIAL: Good  CLINICAL DECISION MAKING: Stable/uncomplicated  EVALUATION COMPLEXITY: Low   GOALS: Goals reviewed with patient? Yes  SHORT TERM GOALS: (target date for Short term goals are 3 weeks 11/17/2022)   1.  Patient will demonstrate independent use of home exercise program to maintain progress from in clinic treatments.  Goal status: New  LONG TERM GOALS: (target dates for all long term goals are 10 weeks  01/05/2023 )   1. Patient will demonstrate/report pain at worst less than or equal to 2/10 to facilitate minimal limitation in daily activity secondary to pain symptoms.  Goal status: New   2. Patient will demonstrate independent use of home exercise program to facilitate ability to maintain/progress functional gains from skilled physical therapy services.  Goal status: New   3. Patient will demonstrate FOTO outcome > or = 69 % to indicate reduced disability due to condition.  Goal status: New   4.  Patient will demonstrate Lt LE MMT 5/5 throughout to faciltiate usual transfers, stairs, squatting at [facility] for daily life.   Goal status: New   5.  Patient will demonstrate independent ambulation community distances > 500 ft s deviation.  Goal status: New   6.  Patient will demonstrate Lt knee AROM 0-130 s symptoms to facilitate usual mobility at PLOF.  Goal status: New   7.  Patient will demonstrate ascending/descending stairs reciprocally without UE assist for living entry/exit.  Goal Status: New   PLAN:  PT FREQUENCY: 1-2x/week  PT DURATION: 10 weeks  PLANNED INTERVENTIONS: Can include 09323- PT Re-evaluation, 97110-Therapeutic exercises, 97530- Therapeutic  activity, [id number]- Neuromuscular re-education, 97535- Self Care, 97140- Manual therapy, [phone]- Gait training, [phone]- Orthotic Fit/training, [phone]- Canalith repositioning, [id number]- Aquatic Therapy, 97014- Electrical stimulation (unattended), [id number]- Electrical stimulation (manual), [id number]- Vasopneumatic device, [id number]- Ultrasound, [id number]- Traction (mechanical), [id number]- Ionotophoresis 4mg /ml Dexamethasone, Patient/Family education, Balance training, Stair training, Taping, Dry Needling, Joint mobilization, Joint manipulation, Spinal manipulation, Spinal mobilization, Scar mobilization, Vestibular training, Visual/preceptual remediation/compensation, DME instructions, Cryotherapy, and  Moist heat.  All performed as medically necessary.  All included unless contraindicated  PLAN FOR NEXT SESSION: FOTO update around 30 days, strength testing.     Chyrel Masson, PT, DPT, OCS, ATC 11/09/22  3:46 PM     Date of referral: 10/12/2022 Referring provider: Persons, [place], [place] Referring diagnosis? M25.562 (ICD-10-CM) - Acute pain of left knee Treatment diagnosis? (if different than referring diagnosis) M25.562 (ICD-10-CM) - Acute pain of left knee  What was this (referring dx) caused by? Thana Ates of Condition: Initial Onset (within last 3 months)   Laterality: Lt  Current Functional Measure Score: FOTO 58  Objective measurements identify impairments when they are compared to normal values, the uninvolved extremity, and prior level of function.  [x]  Yes  []  No  Objective assessment of functional ability: Moderate functional limitations   Briefly describe symptoms: Symptoms noted in Lt knee during the day with sitting, standing, sleeping, WB activity.  Large limitation in functional activity and entry/exit on stairs for living environment.  Unable to work or exercise at this time.  Has to use cane in ambulation for safety.  Moderate symptom level noted.   How did symptoms start: Related to fall down  stairs onto Lt knee  Average pain intensity:  Last 24 hours:  5/10   Past week: 5/10  How often does the pt experience symptoms? Frequently  How much have the symptoms interfered with usual daily activities? Extremely  How has condition changed since care began at this facility? NA - initial visit  In general, how is the patients overall health? Good   BACK PAIN (STarT Back Screening Tool) No

## 2022-11-12 ENCOUNTER — Encounter: Payer: Medicare Other | Admitting: Rehabilitative and Restorative Service Providers"

## 2022-11-12 ENCOUNTER — Ambulatory Visit: Payer: Medicare Other | Admitting: Physician Assistant

## 2022-11-16 ENCOUNTER — Ambulatory Visit
Admission: RE | Admit: 2022-11-16 | Discharge: 2022-11-16 | Disposition: A | Discharge status: Home / Self Care | Payer: Medicare Other | Source: Ambulatory Visit | Attending: Nurse Practitioner | Admitting: Nurse Practitioner

## 2022-11-16 DIAGNOSIS — Z1231 Encounter for screening mammogram for malignant neoplasm of breast: Secondary | ICD-10-CM

## 2022-11-17 ENCOUNTER — Telehealth: Payer: Self-pay

## 2022-11-17 ENCOUNTER — Encounter: Payer: Self-pay | Admitting: Physical Therapy

## 2022-11-17 ENCOUNTER — Ambulatory Visit: Payer: Medicare Other | Admitting: Physical Therapy

## 2022-11-17 ENCOUNTER — Other Ambulatory Visit: Payer: Self-pay | Admitting: Nurse Practitioner

## 2022-11-17 DIAGNOSIS — M6281 Muscle weakness (generalized): Secondary | ICD-10-CM | POA: Diagnosis not present

## 2022-11-17 DIAGNOSIS — R262 Difficulty in walking, not elsewhere classified: Secondary | ICD-10-CM | POA: Diagnosis not present

## 2022-11-17 DIAGNOSIS — E039 Hypothyroidism, unspecified: Secondary | ICD-10-CM

## 2022-11-17 DIAGNOSIS — R600 Localized edema: Secondary | ICD-10-CM | POA: Diagnosis not present

## 2022-11-17 DIAGNOSIS — M25562 Pain in left knee: Secondary | ICD-10-CM | POA: Diagnosis not present

## 2022-11-17 NOTE — Telephone Encounter (Signed)
Labs only visit repeat lipid and A1C

## 2022-11-17 NOTE — Therapy (Signed)
OUTPATIENT PHYSICAL THERAPY TREATMENT   Patient Name: Diamond Hughes MRN: [id number] DOB:02-Jan-1951, 72 y.o., female Today's Date: 11/17/2022  END OF SESSION:  PT End of Session - 11/17/22 1455     Visit Number 4    Number of Visits 20    Date for PT Re-Evaluation 01/05/23    Authorization Type [employer] $20 copay    Progress Note Due on Visit 10    PT Start Time 1455    PT Stop Time 1535    PT Time Calculation (min) 40 min    Activity Tolerance Patient tolerated treatment well    Behavior During Therapy WFL for tasks assessed/performed                Past Medical History:  Diagnosis Date   CAD (coronary artery disease)    a. 2000 s/p MI and RCA PCI [facility]);  b. 02/2016 NSTEMI/PCI: LM nl, LAD nl, LCX 100p (3.5x18 Resolute Onyx DES), OM1/3 small, RCA 70p aneurysmal, 59m/d ISR-->attempted PCI, could not cross w/ wire-->med rx, EF 45-50%.   Hyperlipidemia    Hypertension    Ischemic cardiomyopathy    a. 02/2016 Echo: EF 45-50%, basal-mid inferior/posterior HK, Gr1 DD, triv MR, nl LA size, nl RV fxn.   Normocytic anemia    Past Surgical History:  Procedure Laterality Date   ANGIOPLASTY  2000   CORONARY STENT INTERVENTION N/A 02/10/2016   Procedure: Coronary Stent Intervention;  Surgeon: Lyn Records, MD;  Location: [facility] INVASIVE CV LAB;  Service: Cardiovascular;  Laterality: N/A;   LEFT HEART CATH AND CORONARY ANGIOGRAPHY N/A 02/10/2016   Procedure: Left Heart Cath and Coronary Angiography;  Surgeon: Lyn Records, MD;  Location: [facility] INVASIVE CV LAB;  Service: Cardiovascular;  Laterality: N/A;   Patient Active Problem List   Diagnosis Date Noted   Gouty arthritis of right great toe 10/22/2020   Overweight (BMI 25.0-29.9) 04/18/2020   Type 2 diabetes mellitus with hyperglycemia, without long-term current use of insulin (HCC) 03/14/2020   Sciatica without lumbago 02/01/2019   Acquired hypothyroidism 12/06/2017   Ischemic cardiomyopathy 02/20/2016   Hyperlipidemia  associated with type 2 diabetes mellitus (HCC) 02/12/2016   CAD (coronary artery disease) 02/12/2016   Normocytic anemia 02/12/2016   NSTEMI (non-ST elevated myocardial infarction) (HCC) 02/08/2016   Essential hypertension 02/10/2012    PCP:  Anne Ng NP  REFERRING PROVIDER: Persons, [place], [place]  REFERRING DIAG: [id number] (ICD-10-CM) - Acute pain of left knee  THERAPY DIAG:  Acute pain of left knee  Muscle weakness (generalized)  Difficulty in walking, not elsewhere classified  Localized edema  Rationale for Evaluation and Treatment: Rehabilitation  ONSET DATE: Sept 10th or 11th per Pt, 2024.   SUBJECTIVE:   SUBJECTIVE STATEMENT:  Still having pain when driving the clutch. Not using cane anymore and don't use the cane on steps using the rails. Feel like maybe the sciatica is causing more issues than my knee. I feel good in my car (mini cooper) except when using the clutch, but when I drive my husband's car   PERTINENT HISTORY: CAD, hyperlipidemia, HTN   PAIN:  NPRS scale: up to 4-5/10 Pain location: Lt knee  Pain description: throbbing, piercing Aggravating factors: transfers after sitting, WB activity, prolonged sitting, stairs Relieving factors: OTC medicine, omega XL  PRECAUTIONS: None  WEIGHT BEARING RESTRICTIONS: No  FALLS:  Has patient fallen in last 6 months? Yes. 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    PLOF: Independent, read/write,  has 02 fitness membership.   PATIENT GOALS: Reduce pain, get back to work/hobbies/exercise   OBJECTIVE:   DIAGNOSTIC FINDINGS:  10/27/2022 chart review: IMPRESSION: 1. Torn ACL. 2. Fracture of the posterior rim of the medial tibial plateau with minimal impaction of the posterior rim fragment. 3. Bone bruising or subtle impaction  fracture posteriorly in the lateral tibial plateau. 4. Diminutive size of the medial meniscus, without a well-defined flap or bucket-handle tear identified. 5. Large knee effusion. 6. Small Baker's cyst. 7. Mild semimembranosus-tibial collateral ligament bursitis. 8. Edema in the popliteus muscle favoring strain. 9. Edema around the MCL without an overt tear identified. 10. Abnormal edema tracks along the medial patellar retinaculum and medial patellofemoral ligament but without tear of either structure definitively seen. 11. Circumferential but mainly anterior subcutaneous edema around the knee.  PATIENT SURVEYS:  10/27/2022 FOTO intake:  58  predicted:  69  COGNITION: 10/27/2022 Overall cognitive status: WFL    SENSATION: 10/27/2022 No testing today.   EDEMA:  10/27/2022 Localized edema Lt knee  MUSCLE LENGTH: 10/27/2022 Not tested today.   POSTURE:  10/27/2022 Weight shift to Rt leg in stance.   PALPATION: 10/27/2022 Tenderness back of knee in extension to light touch.  No other specific tenderness in clinic today  LOWER EXTREMITY ROM:   ROM Right 10/27/2022 Left 10/27/2022 Left 11/17/22  Hip flexion     Hip extension     Hip abduction     Hip adduction     Hip internal rotation     Hip external rotation     Knee flexion  130 AROM supine heel slide c pain 125* seated   Knee extension  -5 AROM in seated LAQ  c pain -6*  Ankle dorsiflexion     Ankle plantarflexion     Ankle inversion     Ankle eversion      (Blank rows = not tested)  LOWER EXTREMITY MMT:  MMT Right 10/27/2022 Left 10/27/2022 Left 11/17/22  Hip flexion 5/5 5/5 5  Hip extension     Hip abduction     Hip adduction     Hip internal rotation     Hip external rotation     Knee flexion 5/5 4/5 4+  Knee extension 5/5 4/5 4+  Ankle dorsiflexion 5/5 5/5   Ankle plantarflexion     Ankle inversion     Ankle eversion      (Blank rows = not tested)  LOWER EXTREMITY SPECIAL TESTS:   10/27/2022 No specific testing today  FUNCTIONAL TESTS:  10/27/2022 18 inch chair transfer: able to perform on 1st try without UE, deviation Rt leg WB.  Lt SLS: unable unassisted Rt SLS: < 3 seconds attempt  GAIT: 11/09/2022:  Independent ambulation with mild WB deviation noted.   10/27/2022 SPC use in Rt hand with reduced stance on Lt, weight shift to Rt with decreased gait speed.  TODAY'S TREATMENT                                                                          DATE:    11/17/22  NMR  Nustep L5x6 minutes BLEs Tandem stance solid surface 3x30 seconds B  Narrow BOS EC blue foam pad 3x30 seconds  SLS with one foot on 6 inch box 3x30 seconds B     Selfcare   She had lots of questions today- discussed possible differences in her pain when driving her car and husband's car, how PT assesses progress and goals/determines when DC is appropriate, other activities she can incorporate at the gym on her own, different types and origins of pain (muscle weakness vs joint stiffness/dysfunction vs edema/etc). Discussed why she is having good days and bad days- might be overdoing some days when she feels good leading to bad days after that, encouraged moderation and letting pain be her guide when it comes to exercise and activity as she continues to heal . Encouraged continuing with PT for awhile longer so we can help her recover as much as possible and avoid poor functional outcomes.       11/09/2022 Therex:  Nustep Lvl 5 UE/LE 5 mins  Recumbent bike lvl 1, 5 mins (trial for home/gym use) - seat 4 Seated quad set Lt leg 5 sec hold x 10 Seated quad set c SLR slowly bilaterally 2 x 10   Neuro Re-ed Tandem stance on foam 1 min x 2 bilateral with occasional HHA on bar  Tandem ambulation on foam 6 ft x 1 fwd/back,  then on floor 10 ft x 4 fwd/back each in // bars with occasional HHA  TherActivity ( to improve stairs, transfers, walking, standing) Leg press double leg 100 lbs x15   , single leg  2 x 15 (performed bilaterally)  43 lbs- cues for gym use and slow lowering focus  Step up over and down WB on Lt leg 4 inch step with single hand on rail x 10  Sit to stand sit 18 inch without UE assist x 10 c slow lowering   TODAY'S TREATMENT                                                                          DATE:  11/03/2022 TherEx TM speed 1.5 x8 minutes direct PT supervision LAQs 3# 10x3 second holds L LE  Bridges 12x3 second holds Sidelying clams yellow TB x12 B SLRs 0# x10 B STS with 2 inch box under R LE for improved L wt shift x10 no UEs HS stretches L LE 3x30 seconds  Standing hip ABD yellow TB x10 B  Standing hip extension yellow TB x10 B   TODAY'S TREATMENT  DATE: 10/27/2022 Therex:    HEP instruction/performance c cues for techniques, handout provided.  Trial set performed of each for comprehension and symptom assessment.  See below for exercise list  Vasopneumatic Device 10 mins Lt knee medium compression 34 deg in elevation.   PATIENT EDUCATION:  11/09/2022 Education details: HEP update Person educated: Patient Education method: Programmer, multimedia, Demonstration, Verbal cues, and Handouts Education comprehension: verbalized understanding, returned demonstration, and verbal cues required  HOME EXERCISE PROGRAM:  Access Code: [email or web address] URL: [email or web address] Date: 11/17/2022 Prepared by: Nedra Hai  Exercises - Seated Long Arc Quad  - 3-5 x daily - 7 x weekly - 1-2 sets - 10 reps - 2 hold - Seated Quad Set (Mirrored)  - 3-5 x daily - 7 x weekly - 1 sets - 10 reps - 5 hold - Seated Straight Leg Heel Taps  - 1-2 x daily - 7 x weekly - 3 sets - 10 reps - Supine Knee Extension Mobilization with  Weight (Mirrored)  - 4-5 x daily - 7 x weekly - 1 sets - 1 reps - to tolerance up to 15 mins hold - Supine Bridge  - 1-2 x daily - 7 x weekly - 3 sets - 10 reps - 3 seconds  hold - Clamshell with Resistance  - 1 x daily - 7 x weekly - 3 sets - 10 reps - 1 second  hold - Sit to Stand  - 3 x daily - 7 x weekly - 1 sets - 10 reps - Tandem Stance in Corner  - 1 x daily - 7 x weekly - 1 sets - 3 reps - 30 seconds  hold - Single Leg Stance  - 1 x daily - 7 x weekly - 1 sets - 6 reps  ASSESSMENT:  CLINICAL IMPRESSION:  Pt arrives today doing OK, anxious to get back to her regular activities. Had a lot of questions for me today and we ended up doing an informal re-assessment which still shows limited (but improving) functional strength, ongoing knee stiffness, and impaired balance. She would really benefit from continuation of skilled PT services, encouraged this throughout session today. Spent a bit more time on balance focus per her request. Will continue to progress as tolerated/appropriate.    OBJECTIVE IMPAIRMENTS: Abnormal gait, decreased activity tolerance, decreased balance, decreased coordination, decreased endurance, decreased mobility, difficulty walking, decreased ROM, decreased strength, hypomobility, increased edema, increased fascial restrictions, impaired perceived functional ability, impaired flexibility, improper body mechanics, and pain.   ACTIVITY LIMITATIONS: carrying, lifting, bending, sitting, standing, squatting, sleeping, stairs, transfers, bed mobility, and locomotion level  PARTICIPATION LIMITATIONS: meal prep, cleaning, laundry, interpersonal relationship, driving, shopping, community activity, and occupation  PERSONAL FACTORS:  no specific factors  are also affecting patient's functional outcome.   REHAB POTENTIAL: Good  CLINICAL DECISION MAKING: Stable/uncomplicated  EVALUATION COMPLEXITY: Low   GOALS: Goals reviewed with patient? Yes  SHORT TERM GOALS: (target  date for Short term goals are 3 weeks 11/17/2022)   1.  Patient will demonstrate independent use of home exercise program to maintain progress from in clinic treatments.  Goal status: MET 11/17/22  LONG TERM GOALS: (target dates for all long term goals are 10 weeks  01/05/2023 )   1. Patient will demonstrate/report pain at worst less than or equal to 2/10 to facilitate minimal limitation in daily activity secondary to pain symptoms.  Goal status: ONGOING 11/17/22   2. Patient will demonstrate independent use of home exercise program to facilitate ability to maintain/progress functional gains  from skilled physical therapy services.  Goal status: ONGOING  11/17/22   3. Patient will demonstrate FOTO outcome > or = 69 % to indicate reduced disability due to condition.  Goal status: DNT 11/17/22   4.  Patient will demonstrate Lt LE MMT 5/5 throughout to faciltiate usual transfers, stairs, squatting at [facility] for daily life.   Goal status: ONGOING 11/17/22   5.  Patient will demonstrate independent ambulation community distances > 500 ft s deviation.  Goal status: ONGOING 11/17/22   6.  Patient will demonstrate Lt knee AROM 0-130 s symptoms to facilitate usual mobility at PLOF.  Goal status: ONGOING 11/17/22   7.  Patient will demonstrate ascending/descending stairs reciprocally without UE assist for living entry/exit.  Goal Status: DNT 11/17/22   PLAN:  PT FREQUENCY: 1-2x/week  PT DURATION: 10 weeks  PLANNED INTERVENTIONS: Can include 91478- PT Re-evaluation, 97110-Therapeutic exercises, 97530- Therapeutic activity, 97112- Neuromuscular re-education, 97535- Self Care, 97140- Manual therapy, [phone]- Gait training, [phone]- Orthotic Fit/training, [phone]- Canalith repositioning, [id number]- Aquatic Therapy, 97014- Electrical stimulation (unattended), [id number]- Electrical stimulation (manual), [id number]- Vasopneumatic device, [id number]- Ultrasound, [id number]- Traction (mechanical), [id number]- Ionotophoresis 4mg /ml  Dexamethasone, Patient/Family education, Balance training, Stair training, Taping, Dry Needling, Joint mobilization, Joint manipulation, Spinal manipulation, Spinal mobilization, Scar mobilization, Vestibular training, Visual/preceptual remediation/compensation, DME instructions, Cryotherapy, and Moist heat.  All performed as medically necessary.  All included unless contraindicated  PLAN FOR NEXT SESSION: FOTO update around 30 days, progress strength/ROM/balance as appropriate     Nedra Hai, PT, DPT 11/17/22 3:41 PM      Date of referral: 10/12/2022 Referring provider: Persons, [place], [place] Referring diagnosis? M25.562 (ICD-10-CM) - Acute pain of left knee Treatment diagnosis? (if different than referring diagnosis) M25.562 (ICD-10-CM) - Acute pain of left knee  What was this (referring dx) caused by? Thana Ates of Condition: Initial Onset (within last 3 months)   Laterality: Lt  Current Functional Measure Score: FOTO 58  Objective measurements identify impairments when they are compared to normal values, the uninvolved extremity, and prior level of function.  [x]  Yes  []  No  Objective assessment of functional ability: Moderate functional limitations   Briefly describe symptoms: Symptoms noted in Lt knee during the day with sitting, standing, sleeping, WB activity.  Large limitation in functional activity and entry/exit on stairs for living environment.  Unable to work or exercise at this time.  Has to use cane in ambulation for safety.  Moderate symptom level noted.   How did symptoms start: Related to fall down stairs onto Lt knee  Average pain intensity:  Last 24 hours:  5/10   Past week: 5/10  How often does the pt experience symptoms? Frequently  How much have the symptoms interfered with usual daily activities? Extremely  How has condition changed since care began at this facility? NA - initial visit  In general, how is the patients overall health?  Good   BACK PAIN (STarT Back Screening Tool) No

## 2022-11-18 ENCOUNTER — Other Ambulatory Visit: Payer: Medicare Other

## 2022-11-18 DIAGNOSIS — E1165 Type 2 diabetes mellitus with hyperglycemia: Secondary | ICD-10-CM | POA: Diagnosis not present

## 2022-11-18 DIAGNOSIS — E1169 Type 2 diabetes mellitus with other specified complication: Secondary | ICD-10-CM | POA: Diagnosis not present

## 2022-11-18 DIAGNOSIS — E785 Hyperlipidemia, unspecified: Secondary | ICD-10-CM | POA: Diagnosis not present

## 2022-11-18 LAB — LIPID PANEL
Cholesterol: 178 mg/dL (ref 0–200)
HDL: 79.6 mg/dL (ref >39.00)
LDL Cholesterol: 73 mg/dL (ref 0–99)
NonHDL: 98.7
Total CHOL/HDL Ratio: 2
Triglycerides: 129 mg/dL (ref 0.0–149.0)
VLDL: 25.8 mg/dL (ref 0.0–40.0)

## 2022-11-18 LAB — HEMOGLOBIN A1C: Hgb A1c MFr Bld: 6.1 % (ref 4.6–6.5)

## 2022-11-19 ENCOUNTER — Encounter: Payer: Medicare Other | Admitting: Physical Therapy

## 2022-11-20 ENCOUNTER — Telehealth: Payer: Medicare Other | Admitting: Nurse Practitioner

## 2022-11-20 NOTE — Telephone Encounter (Signed)
Pt would like a call about her lab results.   Diamond Hughes [phone]

## 2022-11-20 NOTE — Telephone Encounter (Signed)
Spoke to patient and went over results and plan. Patient verbalized understanding.

## 2022-11-22 ENCOUNTER — Other Ambulatory Visit: Payer: Self-pay | Admitting: Nurse Practitioner

## 2022-12-01 ENCOUNTER — Ambulatory Visit: Payer: Medicare Other | Admitting: Rehabilitative and Restorative Service Providers"

## 2022-12-01 ENCOUNTER — Encounter: Payer: Self-pay | Admitting: Rehabilitative and Restorative Service Providers"

## 2022-12-01 DIAGNOSIS — M25562 Pain in left knee: Secondary | ICD-10-CM | POA: Diagnosis not present

## 2022-12-01 DIAGNOSIS — R262 Difficulty in walking, not elsewhere classified: Secondary | ICD-10-CM | POA: Diagnosis not present

## 2022-12-01 DIAGNOSIS — M6281 Muscle weakness (generalized): Secondary | ICD-10-CM

## 2022-12-01 DIAGNOSIS — R600 Localized edema: Secondary | ICD-10-CM

## 2022-12-01 NOTE — Therapy (Signed)
OUTPATIENT PHYSICAL THERAPY TREATMENT / PROGRESS NOTE   Patient Name: Diamond Hughes MRN: [id number] DOB:May 21, 1950, 72 y.o., female Today's Date: 12/01/2022  Progress Note Reporting Period 10/27/2022 to 12/01/2022  See note below for Objective Data and Assessment of Progress/Goals.      END OF SESSION:  PT End of Session - 12/01/22 1420     Visit Number 5    Number of Visits 20    Date for PT Re-Evaluation 01/05/23    Authorization Type [employer] Medicare $20 copay    Progress Note Due on Visit 15    PT Start Time 1423    PT Stop Time 1502    PT Time Calculation (min) 39 min    Activity Tolerance Patient tolerated treatment well    Behavior During Therapy WFL for tasks assessed/performed                 Past Medical History:  Diagnosis Date   CAD (coronary artery disease)    a. 2000 s/p MI and RCA PCI [facility] [place]);  b. 02/2016 NSTEMI/PCI: LM nl, LAD nl, LCX 100p (3.5x18 Resolute Onyx DES), OM1/3 small, RCA 70p aneurysmal, 31m/d ISR-->attempted PCI, could not cross w/ wire-->med rx, EF 45-50%.   Hyperlipidemia    Hypertension    Ischemic cardiomyopathy    a. 02/2016 Echo: EF 45-50%, basal-mid inferior/posterior HK, Gr1 DD, triv MR, nl LA size, nl RV fxn.   Normocytic anemia    Past Surgical History:  Procedure Laterality Date   ANGIOPLASTY  2000   CORONARY STENT INTERVENTION N/A 02/10/2016   Procedure: Coronary Stent Intervention;  Surgeon: Lyn Records, MD;  Location: [facility] INVASIVE CV LAB;  Service: Cardiovascular;  Laterality: N/A;   LEFT HEART CATH AND CORONARY ANGIOGRAPHY N/A 02/10/2016   Procedure: Left Heart Cath and Coronary Angiography;  Surgeon: Lyn Records, MD;  Location: [facility] INVASIVE CV LAB;  Service: Cardiovascular;  Laterality: N/A;   Patient Active Problem List   Diagnosis Date Noted   Gouty arthritis of right great toe 10/22/2020   Overweight (BMI 25.0-29.9) 04/18/2020   Type 2 diabetes mellitus with hyperglycemia, without long-term current use of  insulin (HCC) 03/14/2020   Sciatica without lumbago 02/01/2019   Acquired hypothyroidism 12/06/2017   Ischemic cardiomyopathy 02/20/2016   Hyperlipidemia associated with type 2 diabetes mellitus (HCC) 02/12/2016   CAD (coronary artery disease) 02/12/2016   Normocytic anemia 02/12/2016   NSTEMI (non-ST elevated myocardial infarction) (HCC) 02/08/2016   Essential hypertension 02/10/2012    PCP:  Anne Ng NP  REFERRING PROVIDER: Persons, [place], [place]  REFERRING DIAG: [id number] (ICD-10-CM) - Acute pain of left knee  THERAPY DIAG:  Acute pain of left knee  Muscle weakness (generalized)  Difficulty in walking, not elsewhere classified  Localized edema  Rationale for Evaluation and Treatment: Rehabilitation  ONSET DATE: Sept 10th or 11th per Pt, 2024.   SUBJECTIVE:   SUBJECTIVE STATEMENT: Pt indicated symptoms up to 4/10, noted with some movements after sitting prolonged and moving at night.   Pt indicated overall improvement for knee 85% at this time.   PERTINENT HISTORY: CAD, hyperlipidemia, HTN   PAIN:  NPRS scale: up to 4/10 Pain location: Lt knee  Pain description: throbbing, piercing Aggravating factors: transfers after sitting, WB activity, prolonged sitting, stairs Relieving factors: OTC medicine, omega XL  PRECAUTIONS: None  WEIGHT BEARING RESTRICTIONS: No  FALLS:  Has patient fallen in last 6 months? Yes. 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    PLOF: Independent, read/write,  has 02 fitness membership.   PATIENT GOALS: Reduce pain, get back to work/hobbies/exercise   OBJECTIVE:   DIAGNOSTIC FINDINGS:  10/27/2022 chart review: IMPRESSION: 1. Torn ACL. 2. Fracture of the posterior rim of the medial tibial plateau with minimal impaction of the posterior rim  fragment. 3. Bone bruising or subtle impaction fracture posteriorly in the lateral tibial plateau. 4. Diminutive size of the medial meniscus, without a well-defined flap or bucket-handle tear identified. 5. Large knee effusion. 6. Small Baker's cyst. 7. Mild semimembranosus-tibial collateral ligament bursitis. 8. Edema in the popliteus muscle favoring strain. 9. Edema around the MCL without an overt tear identified. 10. Abnormal edema tracks along the medial patellar retinaculum and medial patellofemoral ligament but without tear of either structure definitively seen. 11. Circumferential but mainly anterior subcutaneous edema around the knee.  PATIENT SURVEYS:  12/01/2022: FOTO update:  84  10/27/2022 FOTO intake:  58  predicted:  69  COGNITION: 10/27/2022 Overall cognitive status: WFL    SENSATION: 10/27/2022 No testing today.   EDEMA:  10/27/2022 Localized edema Lt knee  MUSCLE LENGTH: 10/27/2022 Not tested today.   POSTURE:  10/27/2022 Weight shift to Rt leg in stance.   PALPATION: 10/27/2022 Tenderness back of knee in extension to light touch.  No other specific tenderness in clinic today  LOWER EXTREMITY ROM:   ROM Right 10/27/2022 Left 10/27/2022 Left 11/17/22   Hip flexion      Hip extension      Hip abduction      Hip adduction      Hip internal rotation      Hip external rotation      Knee flexion  130 AROM supine heel slide c pain 125* seated    Knee extension  -5 AROM in seated LAQ  c pain -6*   Ankle dorsiflexion      Ankle plantarflexion      Ankle inversion      Ankle eversion       (Blank rows = not tested)  LOWER EXTREMITY MMT:  MMT Right 10/27/2022 Left 10/27/2022 Left 11/17/22 Left 12/01/2022  Hip flexion 5/5 5/5 5   Hip extension      Hip abduction      Hip adduction      Hip internal rotation      Hip external rotation      Knee flexion 5/5 4/5 4+ 5/5  Knee extension 5/5 4/5 4+ 5/5  Ankle dorsiflexion 5/5 5/5    Ankle  plantarflexion      Ankle inversion      Ankle eversion       (Blank rows = not tested)  LOWER EXTREMITY SPECIAL TESTS:  10/27/2022 No specific testing today  FUNCTIONAL TESTS:  12/01/2022: 18 inch chair transfer: able to perform on 1st try without UE, deviation Rt leg WB.  Lt SLS: unable unassisted Rt SLS: < 3 seconds attempt  10/27/2022 18 inch chair transfer: able to perform on 1st try without UE, deviation Rt leg WB.  Lt SLS: unable unassisted Rt SLS: < 3 seconds attempt  GAIT: 11/09/2022:  Independent ambulation with mild WB deviation noted.   10/27/2022 SPC use in Rt hand with reduced stance on Lt, weight shift to Rt with decreased gait speed.  TODAY'S TREATMENT                                                                          DATE: 12/01/2022 Therex:  Nustep Lvl 5 UE/LE 10 mins  Seated LAQ for ROM 2 x 10 bilateral  Seated Lt leg quad set 5 sec hold x 10  Seated leg quad set with SLR x 15, performed bilaterally   Additional time spent in review of existing HEP for upcoming trial HEP period.  Verbal cues given for techniques.   Neuro Re-ed SLS 30 sec x 3 bilateral with occasional to moderate HHA touch down on bar Tandem stance 1 min x 2 bilaterally with occasional HHA on bar  TherActivity ( to improve stairs, transfers, walking, standing) Reciprocal gait pattern on stairs with single rail assist up/down x 1 flight  TODAY'S TREATMENT                                                                          DATE: 11/17/22 NMR Nustep L5x6 minutes BLEs Tandem stance solid surface 3x30 seconds B  Narrow BOS EC blue foam pad 3x30 seconds  SLS with one foot on 6 inch box 3x30 seconds B   Selfcare She had lots of questions today- discussed possible differences in her pain when driving her car and  husband's car, how PT assesses progress and goals/determines when DC is appropriate, other activities she can incorporate at the gym on her own, different types and origins of pain (muscle weakness vs joint stiffness/dysfunction vs edema/etc). Discussed why she is having good days and bad days- might be overdoing some days when she feels good leading to bad days after that, encouraged moderation and letting pain be her guide when it comes to exercise and activity as she continues to heal . Encouraged continuing with PT for awhile longer so we can help her recover as much as possible and avoid poor functional outcomes.   TODAY'S TREATMENT                                                                          DATE: 11/09/2022 Therex:  Nustep Lvl 5 UE/LE 5 mins  Recumbent bike lvl 1, 5 mins (trial for home/gym use) - seat 4 Seated quad set Lt leg 5 sec hold x 10 Seated quad set c SLR slowly bilaterally 2 x 10   Neuro Re-ed Tandem stance on foam 1 min x 2 bilateral with occasional HHA on bar  Tandem ambulation on foam 6 ft x 1 fwd/back, then on floor 10 ft x 4 fwd/back each in // bars with occasional HHA  TherActivity ( to improve stairs, transfers, walking, standing) Leg  press double leg 100 lbs x15   , single leg  2 x 15 (performed bilaterally)  43 lbs- cues for gym use and slow lowering focus  Step up over and down WB on Lt leg 4 inch step with single hand on rail x 10  Sit to stand sit 18 inch without UE assist x 10 c slow lowering    PATIENT EDUCATION:  11/09/2022 Education details: HEP update Person educated: Patient Education method: Programmer, multimedia, Demonstration, Verbal cues, and Handouts Education comprehension: verbalized understanding, returned demonstration, and verbal cues required  HOME EXERCISE PROGRAM:  Access Code: [email or web address] URL: [email or web address] Date: 11/17/2022 Prepared by: Nedra Hai  Exercises - Seated Long Arc Quad  - 3-5 x daily - 7 x weekly -  1-2 sets - 10 reps - 2 hold - Seated Quad Set (Mirrored)  - 3-5 x daily - 7 x weekly - 1 sets - 10 reps - 5 hold - Seated Straight Leg Heel Taps  - 1-2 x daily - 7 x weekly - 3 sets - 10 reps - Supine Knee Extension Mobilization with Weight (Mirrored)  - 4-5 x daily - 7 x weekly - 1 sets - 1 reps - to tolerance up to 15 mins hold - Supine Bridge  - 1-2 x daily - 7 x weekly - 3 sets - 10 reps - 3 seconds  hold - Clamshell with Resistance  - 1 x daily - 7 x weekly - 3 sets - 10 reps - 1 second  hold - Sit to Stand  - 3 x daily - 7 x weekly - 1 sets - 10 reps - Tandem Stance in Corner  - 1 x daily - 7 x weekly - 1 sets - 3 reps - 30 seconds  hold - Single Leg Stance  - 1 x daily - 7 x weekly - 1 sets - 6 reps  ASSESSMENT:  CLINICAL IMPRESSION: Pt attended 5 visits with good improvement overall noted in functional activity.  FOTO improved greatly.  Due to overall improvements in functional ability and progress towards goals, recommended trial HEP period at this time.  Pt was in agreement with plan.    OBJECTIVE IMPAIRMENTS: Abnormal gait, decreased activity tolerance, decreased balance, decreased coordination, decreased endurance, decreased mobility, difficulty walking, decreased ROM, decreased strength, hypomobility, increased edema, increased fascial restrictions, impaired perceived functional ability, impaired flexibility, improper body mechanics, and pain.   ACTIVITY LIMITATIONS: carrying, lifting, bending, sitting, standing, squatting, sleeping, stairs, transfers, bed mobility, and locomotion level  PARTICIPATION LIMITATIONS: meal prep, cleaning, laundry, interpersonal relationship, driving, shopping, community activity, and occupation  PERSONAL FACTORS:  no specific factors  are also affecting patient's functional outcome.   REHAB POTENTIAL: Good  CLINICAL DECISION MAKING: Stable/uncomplicated  EVALUATION COMPLEXITY: Low   GOALS: Goals reviewed with patient? Yes  SHORT TERM GOALS:  (target date for Short term goals are 3 weeks 11/17/2022)   1.  Patient will demonstrate independent use of home exercise program to maintain progress from in clinic treatments.  Goal status: MET 11/17/22  LONG TERM GOALS: (target dates for all long term goals are 10 weeks  01/05/2023 )   1. Patient will demonstrate/report pain at worst less than or equal to 2/10 to facilitate minimal limitation in daily activity secondary to pain symptoms.  Goal status: ONGOING 12/01/2022   2. Patient will demonstrate independent use of home exercise program to facilitate ability to maintain/progress functional gains from skilled physical therapy services.  Goal status: Mostly met 12/01/2022   3.  Patient will demonstrate FOTO outcome > or = 69 % to indicate reduced disability due to condition.  Goal status: Met 12/01/2022   4.  Patient will demonstrate Lt LE MMT 5/5 throughout to faciltiate usual transfers, stairs, squatting at [facility] for daily life.   Goal status: Met 12/01/2022   5.  Patient will demonstrate independent ambulation community distances > 500 ft s deviation.  Goal status: Met 12/01/2022   6.  Patient will demonstrate Lt knee AROM 0-130 s symptoms to facilitate usual mobility at PLOF.  Goal status: mostly met 12/01/2022   7.  Patient will demonstrate ascending/descending stairs reciprocally without UE assist for living entry/exit.  Goal Status: Met 12/01/2022   PLAN:  PT FREQUENCY: 1-2x/week  PT DURATION: 10 weeks  PLANNED INTERVENTIONS: Can include 16109- PT Re-evaluation, 97110-Therapeutic exercises, 97530- Therapeutic activity, 97112- Neuromuscular re-education, 97535- Self Care, 97140- Manual therapy, [phone]- Gait training, [phone]- Orthotic Fit/training, [phone]- Canalith repositioning, [id number]- Aquatic Therapy, 97014- Electrical stimulation (unattended), [id number]- Electrical stimulation (manual), [id number]- Vasopneumatic device, [id number]- Ultrasound, [id number]- Traction (mechanical), [id number]-  Ionotophoresis 4mg /ml Dexamethasone, Patient/Family education, Balance training, Stair training, Taping, Dry Needling, Joint mobilization, Joint manipulation, Spinal manipulation, Spinal mobilization, Scar mobilization, Vestibular training, Visual/preceptual remediation/compensation, DME instructions, Cryotherapy, and Moist heat.  All performed as medically necessary.  All included unless contraindicated  PLAN FOR NEXT SESSION: Trial HEP.   Chyrel Masson, PT, DPT, OCS, ATC 12/01/22  3:02 PM     Date of referral: 10/12/2022 Referring provider: Persons, [place], [place] Referring diagnosis? M25.562 (ICD-10-CM) - Acute pain of left knee Treatment diagnosis? (if different than referring diagnosis) M25.562 (ICD-10-CM) - Acute pain of left knee  What was this (referring dx) caused by? Thana Ates of Condition: Initial Onset (within last 3 months)   Laterality: Lt  Current Functional Measure Score: FOTO 58  Objective measurements identify impairments when they are compared to normal values, the uninvolved extremity, and prior level of function.  [x]  Yes  []  No  Objective assessment of functional ability: Moderate functional limitations   Briefly describe symptoms: Symptoms noted in Lt knee during the day with sitting, standing, sleeping, WB activity.  Large limitation in functional activity and entry/exit on stairs for living environment.  Unable to work or exercise at this time.  Has to use cane in ambulation for safety.  Moderate symptom level noted.   How did symptoms start: Related to fall down stairs onto Lt knee  Average pain intensity:  Last 24 hours:  5/10   Past week: 5/10  How often does the pt experience symptoms? Frequently  How much have the symptoms interfered with usual daily activities? Extremely  How has condition changed since care began at this facility? NA - initial visit  In general, how is the patients overall health? Good   BACK PAIN (STarT Back Screening  Tool) No

## 2022-12-14 ENCOUNTER — Other Ambulatory Visit: Payer: Self-pay | Admitting: Nurse Practitioner

## 2022-12-14 DIAGNOSIS — I10 Essential (primary) hypertension: Secondary | ICD-10-CM

## 2023-01-11 ENCOUNTER — Other Ambulatory Visit: Payer: Self-pay | Admitting: Nurse Practitioner

## 2023-01-11 DIAGNOSIS — E782 Mixed hyperlipidemia: Secondary | ICD-10-CM

## 2023-01-13 ENCOUNTER — Other Ambulatory Visit: Payer: Self-pay | Admitting: Nurse Practitioner

## 2023-02-06 ENCOUNTER — Other Ambulatory Visit: Payer: Self-pay | Admitting: Nurse Practitioner

## 2023-02-06 DIAGNOSIS — M109 Gout, unspecified: Secondary | ICD-10-CM

## 2023-02-22 ENCOUNTER — Other Ambulatory Visit: Payer: Self-pay | Admitting: Nurse Practitioner

## 2023-02-22 DIAGNOSIS — I10 Essential (primary) hypertension: Secondary | ICD-10-CM

## 2023-02-22 MED ORDER — LISINOPRIL 20 MG PO TABS
20.0000 mg | ORAL_TABLET | Freq: Every day | ORAL | 0 refills | Status: DC
Start: 2023-02-22 — End: 2023-04-09

## 2023-02-22 NOTE — Telephone Encounter (Signed)
 Copied from CRM [id number]. Topic: Clinical - Medication Refill >> Feb 22, 2023  3:38 PM Denese Killings wrote: Most Recent Primary Care Visit:  Provider: [facility]  Department: [facility]  Visit Type: LAB  Date: 11/18/2022  Medication: amLODipine (NORVASC) 5 MG tablet, lisinopril (ZESTRIL) 20 MG tablet  Has the patient contacted their pharmacy? Yes (Agent: If no, request that the patient contact the pharmacy for the refill. If patient does not wish to contact the pharmacy document the reason why and proceed with request.) (Agent: If yes, when and what did the pharmacy advise?) Pharmacy keeps denying it because they are trying to request medication from her old pcp Dr. Broadus John. Patient needs Doctor Authorization on Lisinopril  Is this the correct pharmacy for this prescription? Yes If no, delete pharmacy and type the correct one.  This is the patient's preferred pharmacy:  [id number] [place], [place] - [address] AT [address] [place] [address] Phone: [phone] Fax: [phone]   Has the prescription been filled recently? No  Is the patient out of the medication? No  Has the patient been seen for an appointment in the last year OR does the patient have an upcoming appointment? Yes  Can we respond through MyChart? No  Agent: Please be advised that Rx refills may take up to 3 business days. We ask that you follow-up with your pharmacy.

## 2023-02-23 ENCOUNTER — Other Ambulatory Visit: Payer: Self-pay | Admitting: Nurse Practitioner

## 2023-02-23 DIAGNOSIS — I10 Essential (primary) hypertension: Secondary | ICD-10-CM

## 2023-03-08 ENCOUNTER — Other Ambulatory Visit: Payer: Self-pay | Admitting: Nurse Practitioner

## 2023-03-08 DIAGNOSIS — I10 Essential (primary) hypertension: Secondary | ICD-10-CM

## 2023-03-08 MED ORDER — AMLODIPINE BESYLATE 5 MG PO TABS: ORAL_TABLET | ORAL | 0 refills | Status: DC

## 2023-03-08 NOTE — Telephone Encounter (Signed)
 Copied from CRM [id number]. Topic: Clinical - Medication Refill >> Mar 08, 2023 10:44 AM Adelina Mings wrote: Most Recent Primary Care Visit:  Provider: [facility]  Department: [facility]  Visit Type: LAB  Date: 11/18/2022  Medication: amLODipine (NORVASC) 5 MG tablet   Has the patient contacted their pharmacy? Yes (Agent: If no, request that the patient contact the pharmacy for the refill. If patient does not wish to contact the pharmacy document the reason why and proceed with request.) (Agent: If yes, when and what did the pharmacy advise?)  Is this the correct pharmacy for this prescription? Yes If no, delete pharmacy and type the correct one.  This is the patient's preferred pharmacy:  [id number] [place] [address], [place] - [address] AT [address] [place] [address] Phone: [phone] Fax: [phone]   Has the prescription been filled recently? No  Is the patient out of the medication? Yes  Has the patient been seen for an appointment in the last year OR does the patient have an upcoming appointment? Yes  Can we respond through MyChart? No  Agent: Please be advised that Rx refills may take up to 3 business days. We ask that you follow-up with your pharmacy.

## 2023-03-22 ENCOUNTER — Telehealth: Payer: Self-pay | Admitting: Nurse Practitioner

## 2023-03-22 NOTE — Telephone Encounter (Signed)
 See note

## 2023-03-29 ENCOUNTER — Ambulatory Visit: Payer: Medicare Other | Admitting: Nurse Practitioner

## 2023-03-30 DIAGNOSIS — Z1211 Encounter for screening for malignant neoplasm of colon: Secondary | ICD-10-CM | POA: Diagnosis not present

## 2023-03-30 DIAGNOSIS — Z1212 Encounter for screening for malignant neoplasm of rectum: Secondary | ICD-10-CM | POA: Diagnosis not present

## 2023-04-04 LAB — COLOGUARD: COLOGUARD: NEGATIVE

## 2023-04-04 LAB — EXTERNAL GENERIC LAB PROCEDURE: COLOGUARD: NEGATIVE

## 2023-04-09 ENCOUNTER — Encounter: Payer: Self-pay | Admitting: Nurse Practitioner

## 2023-04-09 ENCOUNTER — Other Ambulatory Visit: Payer: Self-pay | Admitting: Nurse Practitioner

## 2023-04-09 ENCOUNTER — Ambulatory Visit: Admitting: Nurse Practitioner

## 2023-04-09 VITALS — BP 130/72 | HR 78 | Temp 98.0°F | Ht 63.0 in | Wt 179.8 lb

## 2023-04-09 DIAGNOSIS — E785 Hyperlipidemia, unspecified: Secondary | ICD-10-CM

## 2023-04-09 DIAGNOSIS — E039 Hypothyroidism, unspecified: Secondary | ICD-10-CM

## 2023-04-09 DIAGNOSIS — J0140 Acute pansinusitis, unspecified: Secondary | ICD-10-CM

## 2023-04-09 DIAGNOSIS — M109 Gout, unspecified: Secondary | ICD-10-CM

## 2023-04-09 DIAGNOSIS — I10 Essential (primary) hypertension: Secondary | ICD-10-CM | POA: Diagnosis not present

## 2023-04-09 DIAGNOSIS — E1169 Type 2 diabetes mellitus with other specified complication: Secondary | ICD-10-CM

## 2023-04-09 DIAGNOSIS — Z7984 Long term (current) use of oral hypoglycemic drugs: Secondary | ICD-10-CM | POA: Diagnosis not present

## 2023-04-09 DIAGNOSIS — Z604 Social exclusion and rejection: Secondary | ICD-10-CM

## 2023-04-09 DIAGNOSIS — E1165 Type 2 diabetes mellitus with hyperglycemia: Secondary | ICD-10-CM | POA: Diagnosis not present

## 2023-04-09 LAB — COMPREHENSIVE METABOLIC PANEL WITH GFR
ALT: 60 U/L — ABNORMAL HIGH (ref 0–35)
AST: 69 U/L — ABNORMAL HIGH (ref 0–37)
Albumin: 4.7 g/dL (ref 3.5–5.2)
Alkaline Phosphatase: 47 U/L (ref 39–117)
BUN: 18 mg/dL (ref 6–23)
CO2: 27 meq/L (ref 19–32)
Calcium: 9.4 mg/dL (ref 8.4–10.5)
Chloride: 102 meq/L (ref 96–112)
Creatinine, Ser: 0.75 mg/dL (ref 0.40–1.20)
GFR: 80.37 mL/min (ref >60.00)
Glucose, Bld: 95 mg/dL (ref 70–99)
Potassium: 4.5 meq/L (ref 3.5–5.1)
Sodium: 141 meq/L (ref 135–145)
Total Bilirubin: 0.3 mg/dL (ref 0.2–1.2)
Total Protein: 7.9 g/dL (ref 6.0–8.3)

## 2023-04-09 LAB — URIC ACID: Uric Acid, Serum: 7 mg/dL (ref 2.4–7.0)

## 2023-04-09 LAB — HEMOGLOBIN A1C: Hgb A1c MFr Bld: 6.2 % (ref 4.6–6.5)

## 2023-04-09 MED ORDER — FLUTICASONE PROPIONATE 50 MCG/ACT NA SUSP: 2.0000 | Freq: Every day | NASAL | 0 refills | Status: DC

## 2023-04-09 MED ORDER — ATORVASTATIN CALCIUM 80 MG PO TABS: 80.0000 mg | ORAL_TABLET | Freq: Every day | ORAL | 3 refills | Status: AC

## 2023-04-09 MED ORDER — ALLOPURINOL 100 MG PO TABS: 100.0000 mg | ORAL_TABLET | Freq: Every day | ORAL | 1 refills | Status: DC

## 2023-04-09 MED ORDER — AMLODIPINE BESYLATE 5 MG PO TABS: ORAL_TABLET | ORAL | 3 refills | Status: DC

## 2023-04-09 MED ORDER — LEVOTHYROXINE SODIUM 75 MCG PO TABS: 75.0000 ug | ORAL_TABLET | Freq: Every day | ORAL | 1 refills | Status: DC

## 2023-04-09 MED ORDER — LISINOPRIL 20 MG PO TABS: 20.0000 mg | ORAL_TABLET | Freq: Every day | ORAL | 1 refills | Status: DC

## 2023-04-09 MED ORDER — AZITHROMYCIN 250 MG PO TABS: 250.0000 mg | ORAL_TABLET | Freq: Every day | ORAL | 0 refills | Status: DC

## 2023-04-09 MED ORDER — LORATADINE 10 MG PO TABS: 10.0000 mg | ORAL_TABLET | Freq: Every day | ORAL | 0 refills | Status: DC

## 2023-04-09 NOTE — Assessment & Plan Note (Signed)
 No gout exacerbation Repeat CMP and uric acid Maintain allopurinol dose

## 2023-04-09 NOTE — Patient Instructions (Addendum)
 Go to lab

## 2023-04-09 NOTE — Assessment & Plan Note (Signed)
 Current use of metformin No glucose check at home No neuropathy, and retinopathy corrected previous UACr 273.58mg /g, current use of ACE-I LDL at 73, current use of zetia and atorvastatin   Maintain med dose Repeat UACr, hgbA1c CMP, and lipid panel today F/up in 3months

## 2023-04-09 NOTE — Assessment & Plan Note (Signed)
 Reports increased depressive symptoms due to lack of friends. This was exacerbated with recent oral surgery and need for dentures. Stopped attending church due to concern about image due to need for dentures. Has only social interaction with husband and will like to engage in other activities, but not sure how to navigate search.  She agreed to [facility] referral

## 2023-04-09 NOTE — Progress Notes (Signed)
 Established Patient Visit  Patient: Diamond Hughes   DOB: 1950/12/31   73 y.o. Female  MRN: [id number] Visit Date: 04/09/2023  Subjective:    Chief Complaint  Patient presents with   Follow-up    6 month f/u   Medical Management of Chronic Issues    Right ear clogged, light headedness and "off balance" feeling for 1 month    Sinusitis This is a new problem. The current episode started 1 to 4 weeks ago. The problem has been gradually worsening since onset. There has been no fever. Associated symptoms include congestion, coughing, ear pain and sinus pressure. Pertinent negatives include no chills, diaphoresis, headaches, hoarse voice, neck pain, shortness of breath, sneezing, sore throat or swollen glands. (dizziness) Past treatments include nothing.   Social isolation Reports increased depressive symptoms due to lack of friends. This was exacerbated with recent oral surgery and need for dentures. Stopped attending church due to concern about image due to need for dentures. Has only social interaction with husband and will like to engage in other activities, but not sure how to navigate search.  She agreed to [facility] referral  Gouty arthritis of right great toe No gout exacerbation Repeat CMP and uric acid Maintain allopurinol dose   Type 2 diabetes mellitus with hyperglycemia, without long-term current use of insulin (HCC) Current use of metformin No glucose check at home No neuropathy, and retinopathy corrected previous UACr 273.58mg /g, current use of ACE-I LDL at 73, current use of zetia and atorvastatin   Maintain med dose Repeat UACr, hgbA1c CMP, and lipid panel today F/up in 3months     04/09/2023   11:50 AM 08/24/2022    9:27 AM 07/02/2022   10:24 AM  Depression screen PHQ 2/9  Decreased Interest 1 0 0  Down, Depressed, Hopeless 1 0 0  PHQ - 2 Score 2 0 0  Altered sleeping 1 0   Tired, decreased energy 0 0   Change in appetite 0 0   Feeling bad or  failure about yourself  0 0   Trouble concentrating 0 0   Moving slowly or fidgety/restless 0 0   Suicidal thoughts 0 0   PHQ-9 Score 3 0   Difficult doing work/chores Not difficult at all Not difficult at all        04/09/2023   11:50 AM 03/13/2020    1:44 PM 02/13/2016    9:41 AM  GAD 7 : Generalized Anxiety Score  Nervous, Anxious, on Edge 0 0 0  Control/stop worrying 0 0 0  Worry too much - different things 0 0 0  Trouble relaxing 0 0 0  Restless 0 0 0  Easily annoyed or irritable 0 0 0  Afraid - awful might happen 0 0 0  Total GAD 7 Score 0 0 0  Anxiety Difficulty Not difficult at all       SDOH Interventions    Flowsheet Row Office Visit from 04/09/2023 in [facility] Clinical Support from 08/24/2022 in [facility] Clinical Support from 05/07/2020 in [facility] [employer] [facility]  SDOH Interventions     Food Insecurity Interventions -- Intervention Not Indicated --  Housing Interventions -- Intervention Not Indicated --  Transportation Interventions -- Intervention Not Indicated --  Utilities Interventions -- Intervention Not Indicated --  Alcohol Usage Interventions -- Intervention Not Indicated (Score <7) --  Depression Interventions/Treatment  -- ZOX0-9 Score <4 Follow-up Not Indicated --  Financial Strain Interventions -- Intervention Not Indicated --  Physical Activity Interventions -- Other (Comments)  [goes up and down stairs daily] Intervention Not Indicated  [pt has set a goal to increase activity]  Stress Interventions -- Intervention Not Indicated --  Social Connections Interventions [id number] Referral, Other (Comment)  [agreed to [facility]I referral] Intervention Not Indicated --  Health Literacy Interventions -- Intervention Not Indicated --       Reviewed medical, surgical, and social history today  Medications: Outpatient Medications Prior to Visit  Medication Sig    aspirin 81 MG tablet Take 1 tablet (81 mg total) by mouth daily.   clopidogrel (PLAVIX) 75 MG tablet TAKE 1 TABLET BY MOUTH EVERY DAY WITH BREAKFAST   co-enzyme Q-10 30 MG capsule Take 30 mg by mouth 3 (three) times daily.   ezetimibe (ZETIA) 10 MG tablet TAKE 1 TABLET BY MOUTH EVERY DAY   metFORMIN (GLUCOPHAGE) 500 MG tablet Take 0.5 tablets (250 mg total) by mouth daily with breakfast.   metoprolol tartrate (LOPRESSOR) 50 MG tablet TAKE 1 TABLET(50 MG) BY MOUTH TWICE DAILY   Misc Natural Products (BEET ROOT PO) Take by mouth. Powder mixed with water   nitroGLYCERIN (NITROSTAT) 0.4 MG SL tablet Place 1 tablet (0.4 mg total) under the tongue every 5 (five) minutes x 3 doses as needed for chest pain.   Omega-3 Fatty Acids (OMEGA-3 PO) Take 1 capsule by mouth daily.   VITAMIN D PO Take by mouth.   [DISCONTINUED] allopurinol (ZYLOPRIM) 100 MG tablet TAKE 1 TABLET(100 MG) BY MOUTH DAILY   [DISCONTINUED] amLODipine (NORVASC) 5 MG tablet TAKE 1 TABLET(5 MG) BY MOUTH DAILY   [DISCONTINUED] atorvastatin (LIPITOR) 80 MG tablet Take 1 tablet (80 mg total) by mouth daily at 6 PM.   [DISCONTINUED] indomethacin (INDOCIN) 25 MG capsule Take 1 capsule (25 mg total) by mouth 2 (two) times daily as needed. With food   [DISCONTINUED] levothyroxine (SYNTHROID) 75 MCG tablet TAKE 1 TABLET BY MOUTH EVERY DAY IN THE MORNING ON AN EMPTY STOMACH   [DISCONTINUED] lisinopril (ZESTRIL) 20 MG tablet Take 1 tablet (20 mg total) by mouth daily.   No facility-administered medications prior to visit.   Reviewed past medical and social history.   ROS per HPI above  Last CBC Lab Results  Component Value Date   WBC 6.9 03/13/2020   HGB 12.0 03/13/2020   HCT 37.0 03/13/2020   MCV 94.9 03/13/2020   MCH 30.3 08/17/2018   RDW 14.6 03/13/2020   PLT 421.0 (H) 03/13/2020   Last metabolic panel Lab Results  Component Value Date   GLUCOSE 95 07/02/2022   NA 140 07/02/2022   K 4.4 07/02/2022   CL 102 07/02/2022   CO2 24  07/02/2022   BUN 22 07/02/2022   CREATININE 0.60 07/02/2022   EGFR 97 07/02/2022   CALCIUM 9.8 07/02/2022   PROT 7.8 07/02/2022   ALBUMIN 4.5 03/13/2020   LABGLOB 3.0 08/17/2018   AGRATIO 1.6 08/17/2018   BILITOT 0.3 07/02/2022   ALKPHOS 50 03/13/2020   AST 28 07/02/2022   ALT 19 07/02/2022   ANIONGAP 10 02/12/2016   Last lipids Lab Results  Component Value Date   CHOL 178 11/18/2022   HDL 79.60 11/18/2022   LDLCALC 73 11/18/2022   TRIG 129.0 11/18/2022   CHOLHDL 2 11/18/2022   Last hemoglobin A1c Lab Results  Component Value Date   HGBA1C 6.1 11/18/2022   Last  thyroid functions Lab Results  Component Value Date   TSH 5.04 07/13/2022        Objective:  BP 130/72 (BP Location: Left Arm, Patient Position: Sitting, Cuff Size: Normal)   Pulse 78   Temp 98 F (36.7 C) (Temporal)   Ht 5\' 3"  (1.6 m)   Wt 179 lb 12.8 oz (81.6 kg)   SpO2 96%   BMI 31.85 kg/m      Physical Exam Vitals and nursing note reviewed.  Cardiovascular:     Rate and Rhythm: Normal rate and regular rhythm.     Pulses: Normal pulses.     Heart sounds: Normal heart sounds.  Pulmonary:     Effort: Pulmonary effort is normal.     Breath sounds: Normal breath sounds.  Neurological:     Mental Status: She is alert and oriented to person, place, and time.     No results found for any visits on 04/09/23.    Assessment & Plan:    Problem List Items Addressed This Visit     Acquired hypothyroidism   Relevant Medications   levothyroxine (SYNTHROID) 75 MCG tablet   Essential hypertension - Primary   Relevant Medications   atorvastatin (LIPITOR) 80 MG tablet   amLODipine (NORVASC) 5 MG tablet   lisinopril (ZESTRIL) 20 MG tablet   Other Relevant Orders   Comprehensive metabolic panel with GFR   Gouty arthritis of right great toe   No gout exacerbation Repeat CMP and uric acid Maintain allopurinol dose       Relevant Medications   allopurinol (ZYLOPRIM) 100 MG tablet   Other  Relevant Orders   Uric acid   Hyperlipidemia associated with type 2 diabetes mellitus (HCC)   Relevant Medications   atorvastatin (LIPITOR) 80 MG tablet   amLODipine (NORVASC) 5 MG tablet   lisinopril (ZESTRIL) 20 MG tablet   Other Relevant Orders   Comprehensive metabolic panel with GFR   Social isolation   Reports increased depressive symptoms due to lack of friends. This was exacerbated with recent oral surgery and need for dentures. Stopped attending church due to concern about image due to need for dentures. Has only social interaction with husband and will like to engage in other activities, but not sure how to navigate search.  She agreed to [facility] referral      Relevant Orders   AMB Referral VBCI Care Management   Type 2 diabetes mellitus with hyperglycemia, without long-term current use of insulin (HCC)   Current use of metformin No glucose check at home No neuropathy, and retinopathy corrected previous UACr 273.58mg /g, current use of ACE-I LDL at 73, current use of zetia and atorvastatin   Maintain med dose Repeat UACr, hgbA1c CMP, and lipid panel today F/up in 3months      Relevant Medications   atorvastatin (LIPITOR) 80 MG tablet   lisinopril (ZESTRIL) 20 MG tablet   Other Relevant Orders   Hemoglobin A1c   Comprehensive metabolic panel with GFR   Microalbumin / creatinine urine ratio   Other Visit Diagnoses       Acute non-recurrent pansinusitis       Relevant Medications   fluticasone (FLONASE) 50 MCG/ACT nasal spray   loratadine (CLARITIN) 10 MG tablet   azithromycin (ZITHROMAX Z-PAK) 250 MG tablet      Return in about 2 weeks (around 04/23/2023) for dizziness and unsteady gait.     Alysia Penna, NP

## 2023-04-12 NOTE — Telephone Encounter (Signed)
 Tried calling the requesting pharmacy to get clarification and kept getting hung up on. Will forward to prescribing provider.   What is equivalent to a 90 day supply?

## 2023-04-13 ENCOUNTER — Other Ambulatory Visit (INDEPENDENT_AMBULATORY_CARE_PROVIDER_SITE_OTHER)

## 2023-04-13 ENCOUNTER — Other Ambulatory Visit: Payer: Self-pay | Admitting: Nurse Practitioner

## 2023-04-13 DIAGNOSIS — E0821 Diabetes mellitus due to underlying condition with diabetic nephropathy: Secondary | ICD-10-CM

## 2023-04-13 DIAGNOSIS — E1165 Type 2 diabetes mellitus with hyperglycemia: Secondary | ICD-10-CM | POA: Diagnosis not present

## 2023-04-13 DIAGNOSIS — I10 Essential (primary) hypertension: Secondary | ICD-10-CM

## 2023-04-13 LAB — MICROALBUMIN / CREATININE URINE RATIO
Creatinine,U: 18 mg/dL
Microalb Creat Ratio: 1100.7 mg/g — ABNORMAL HIGH (ref 0.0–30.0)
Microalb, Ur: 19.8 mg/dL — ABNORMAL HIGH (ref 0.0–1.9)

## 2023-04-13 MED ORDER — LISINOPRIL 40 MG PO TABS: 40.0000 mg | ORAL_TABLET | Freq: Every day | ORAL | 1 refills | Status: DC

## 2023-04-13 NOTE — Telephone Encounter (Signed)
 Medication: Fluticasone (Flonase) 50 mcg/act nasal  Directions: Place 2 sprays into both nostrils daily.  Last given: 04/09/23 Number refills: 0 Last o/v: 04/09/23 Follow up: 2 weeks (around 04/23/23), 04/28/23 Labs:

## 2023-04-14 ENCOUNTER — Telehealth: Payer: Self-pay

## 2023-04-15 ENCOUNTER — Telehealth: Payer: Self-pay | Admitting: Nurse Practitioner

## 2023-04-15 NOTE — Telephone Encounter (Signed)
 Pt is requesting a call to hear her lab results. [phone]

## 2023-04-15 NOTE — Progress Notes (Signed)
 Complex Care Management Note  Care Guide Note 04/15/2023 Name: Diamond Hughes MRN: [id number] DOB: 1950-03-31  Diamond Hughes is a 73 y.o. year old female who sees Nche, Bonna Gains, NP for primary care. I reached out to Shelbie Hutching by phone today to offer complex care management services.  [name] was given information about Complex Care Management services today including:   The Complex Care Management services include support from the care team which includes your Nurse Care Manager, Clinical Social Worker, or Pharmacist.  The Complex Care Management team is here to help remove barriers to the health concerns and goals most important to you. Complex Care Management services are voluntary, and the patient may decline or stop services at any time by request to their care team member.   Complex Care Management Consent Status: Patient agreed to services and verbal consent obtained.   Follow up plan:  Telephone appointment with complex care management team member scheduled for:  04/20/23 at 10:00 a.m.   Encounter Outcome:  Patient Scheduled  [facility]  [facility], [facility] Health Care Management Assistant Direct Dial: [phone]  Fax: [phone]

## 2023-04-15 NOTE — Telephone Encounter (Signed)
 Results have been relayed to the patient. The patient verbalized understanding. No questions at this time.  Patient will continue lisinopril 20 mg taking 2 pills until she runs out before picking up new Rx

## 2023-04-15 NOTE — Telephone Encounter (Signed)
 07/01/2022 no show 03/29/2023 same day cancellation  Pt was seen 04/09/2023. Letter sent via mail and mychart.

## 2023-04-20 ENCOUNTER — Other Ambulatory Visit: Payer: Self-pay | Admitting: Licensed Clinical Social Worker

## 2023-04-21 NOTE — Patient Outreach (Signed)
 Complex Care Management   Visit Note  04/21/2023  Name:  Diamond Hughes MRN: [id number] DOB: October 24, 1950  Situation: Referral received for Complex Care Management related to  adjustment disorder unspecified  I obtained verbal consent from Patient.  Visit completed with L Gordin  on the phone  Background:   Past Medical History:  Diagnosis Date   CAD (coronary artery disease)    a. 2000 s/p MI and RCA PCI [facility]);  b. 02/2016 NSTEMI/PCI: LM nl, LAD nl, LCX 100p (3.5x18 Resolute Onyx DES), OM1/3 small, RCA 70p aneurysmal, 29m/d ISR-->attempted PCI, could not cross w/ wire-->med rx, EF 45-50%.   Hyperlipidemia    Hypertension    Ischemic cardiomyopathy    a. 02/2016 Echo: EF 45-50%, basal-mid inferior/posterior HK, Gr1 DD, triv MR, nl LA size, nl RV fxn.   Normocytic anemia     Assessment: Patient Reported Symptoms:  Cognitive        Neurological      HEENT        Cardiovascular      Respiratory      Endocrine      Gastrointestinal        Genitourinary      Integumentary      Musculoskeletal          Psychosocial       Do you feel physically threatened by others?: No      04/09/2023   11:50 AM  Depression screen PHQ 2/9  Decreased Interest 1  Down, Depressed, Hopeless 1  PHQ - 2 Score 2  Altered sleeping 1  Tired, decreased energy 0  Change in appetite 0  Feeling bad or failure about yourself  0  Trouble concentrating 0  Moving slowly or fidgety/restless 0  Suicidal thoughts 0  PHQ-9 Score 3  Difficult doing work/chores Not difficult at all    There were no vitals filed for this visit.  Medications Reviewed Today   Medications were not reviewed in this encounter     Recommendation:   Engage in self care activities, communicate needs for additional support w/ spouse, journal writing and consider therapy  Follow Up Plan:   Telephone follow-up 05/04/2023  Fletcher Humble MSW, LCSW Licensed Clinical Social Worker  [facility], Population Health Direct Dial: [phone]  Fax: [phone]

## 2023-04-28 ENCOUNTER — Encounter: Payer: Self-pay | Admitting: Nurse Practitioner

## 2023-04-28 ENCOUNTER — Telehealth: Payer: Self-pay | Admitting: Nurse Practitioner

## 2023-04-28 ENCOUNTER — Ambulatory Visit: Admitting: Nurse Practitioner

## 2023-04-28 VITALS — BP 162/82 | HR 75 | Temp 98.7°F | Ht 63.0 in | Wt 182.0 lb

## 2023-04-28 DIAGNOSIS — I10 Essential (primary) hypertension: Secondary | ICD-10-CM | POA: Diagnosis not present

## 2023-04-28 DIAGNOSIS — Z7985 Long-term (current) use of injectable non-insulin antidiabetic drugs: Secondary | ICD-10-CM

## 2023-04-28 DIAGNOSIS — E785 Hyperlipidemia, unspecified: Secondary | ICD-10-CM | POA: Diagnosis not present

## 2023-04-28 DIAGNOSIS — E1169 Type 2 diabetes mellitus with other specified complication: Secondary | ICD-10-CM | POA: Diagnosis not present

## 2023-04-28 DIAGNOSIS — E0821 Diabetes mellitus due to underlying condition with diabetic nephropathy: Secondary | ICD-10-CM

## 2023-04-28 MED ORDER — LANCET DEVICE MISC: 1.0000 | Freq: Every day | 0 refills | Status: AC

## 2023-04-28 MED ORDER — LANCETS MISC. MISC: 1.0000 | Freq: Every day | 2 refills | Status: AC

## 2023-04-28 MED ORDER — BLOOD GLUCOSE MONITORING SUPPL DEVI: 1.0000 | Freq: Every day | 0 refills | Status: AC

## 2023-04-28 MED ORDER — BLOOD GLUCOSE TEST VI STRP: 1.0000 | ORAL_STRIP | Freq: Every day | 3 refills | Status: AC

## 2023-04-28 MED ORDER — AMLODIPINE BESYLATE 10 MG PO TABS
10.0000 mg | ORAL_TABLET | Freq: Every evening | ORAL | 3 refills | Status: AC
Start: 2023-04-28 — End: ?

## 2023-04-28 MED ORDER — OZEMPIC (0.25 OR 0.5 MG/DOSE) 2 MG/3ML ~~LOC~~ SOPN: 0.2500 mg | PEN_INJECTOR | SUBCUTANEOUS | 5 refills | Status: DC

## 2023-04-28 NOTE — Assessment & Plan Note (Addendum)
 BP not at goal Associated with dizziness in AM only, resolves within 1hr of waking up. Negative ortho vital signs. BP Readings from Last 3 Encounters:  04/28/23 (!) 162/82  04/09/23 130/72  10/12/22 135/71    Increase amlodipine  dose to 10mg  Maintain lisinopril  and metoprolol  dose Send home BP readings via mychart in 2weeks Advised to maintain DASh diet and avoid ALCOHOL intake F/up in 3months

## 2023-04-28 NOTE — Assessment & Plan Note (Signed)
 She consumes 2-12oz cans of beer daily LDL not at goal and mild elevation in AST/ALT.  Advised to stop ALCOHOL consumption. Repeat labs in 3months (fasting)

## 2023-04-28 NOTE — Telephone Encounter (Signed)
 See note

## 2023-04-28 NOTE — Assessment & Plan Note (Signed)
 DIABETES controlled with hgbA1c at 6.2% She will like to resume ozempic  in place of metformin .  Ozempic  0.25mg  weekly sent Stop metfomin if able to obtain ozempic .

## 2023-04-28 NOTE — Progress Notes (Signed)
 Established Patient Visit  Patient: Diamond Hughes   DOB: [birth year]   73 y.o. Female  MRN: [id number] Visit Date: 04/28/2023  Subjective:    Chief Complaint  Patient presents with   Follow-up    States she still has dizzyness not as bad but still has it. States she was on ozempic  and was doing better on that.   HPI Essential hypertension BP not at goal Associated with dizziness in AM only, resolves within 1hr of waking up. Negative ortho vital signs. BP Readings from Last 3 Encounters:  04/28/23 (!) 162/82  04/09/23 130/72  10/12/22 135/71    Increase amlodipine  dose to 10mg  Maintain lisinopril  and metoprolol  dose Send home BP readings via mychart in 2weeks Advised to maintain DASh diet and avoid ALCOHOL intake F/up in 3months  DM (diabetes mellitus) (HCC) DIABETES controlled with hgbA1c at 6.2% She will like to resume ozempic  in place of metformin .  Ozempic  0.25mg  weekly sent Stop metfomin if able to obtain ozempic .  Orthostatic VS for the past 24 hrs (Last 3 readings):  BP- Lying Pulse- Lying BP- Sitting Pulse- Sitting BP- Standing at 0 minutes Pulse- Standing at 0 minutes BP- Standing at 3 minutes Pulse- Standing at 3 minutes  04/28/23 1126 161/90 75 162/86 77 (!) 151/94 78 157/79 78    Reviewed medical, surgical, and social history today  Medications: Outpatient Medications Prior to Visit  Medication Sig   allopurinol  (ZYLOPRIM ) 100 MG tablet Take 1 tablet (100 mg total) by mouth daily.   aspirin  81 MG tablet Take 1 tablet (81 mg total) by mouth daily.   atorvastatin  (LIPITOR ) 80 MG tablet Take 1 tablet (80 mg total) by mouth daily at 6 PM.   clopidogrel  (PLAVIX ) 75 MG tablet TAKE 1 TABLET BY MOUTH EVERY DAY WITH BREAKFAST   co-enzyme Q-10 30 MG capsule Take 30 mg by mouth 3 (three) times daily.   ezetimibe  (ZETIA ) 10 MG tablet TAKE 1 TABLET BY MOUTH EVERY DAY   fluticasone  (FLONASE ) 50 MCG/ACT nasal spray Place 2 sprays into both nostrils daily.    levothyroxine  (SYNTHROID ) 75 MCG tablet Take 1 tablet (75 mcg total) by mouth daily before breakfast.   lisinopril  (ZESTRIL ) 40 MG tablet Take 1 tablet (40 mg total) by mouth daily.   loratadine  (CLARITIN ) 10 MG tablet Take 1 tablet (10 mg total) by mouth daily.   metFORMIN  (GLUCOPHAGE ) 500 MG tablet Take 0.5 tablets (250 mg total) by mouth daily with breakfast.   metoprolol  tartrate (LOPRESSOR ) 50 MG tablet TAKE 1 TABLET(50 MG) BY MOUTH TWICE DAILY   Misc Natural Products (BEET ROOT PO) Take by mouth. Powder mixed with water   nitroGLYCERIN  (NITROSTAT ) 0.4 MG SL tablet Place 1 tablet (0.4 mg total) under the tongue every 5 (five) minutes x 3 doses as needed for chest pain.   Omega-3 Fatty Acids (OMEGA-3 PO) Take 1 capsule by mouth daily.   VITAMIN D  PO Take by mouth.   [DISCONTINUED] amLODipine  (NORVASC ) 5 MG tablet TAKE 1 TABLET(5 MG) BY MOUTH DAILY   [DISCONTINUED] azithromycin  (ZITHROMAX  Z-PAK) 250 MG tablet Take 1 tablet (250 mg total) by mouth daily. Take 2tabs on first day, then 1tab once a day till complete   No facility-administered medications prior to visit.   Reviewed past medical and social history.   ROS per HPI above  Last CBC Lab Results  Component Value Date   WBC 6.9 03/13/2020  HGB 12.0 03/13/2020   HCT 37.0 03/13/2020   MCV 94.9 03/13/2020   MCH 30.3 08/17/2018   RDW 14.6 03/13/2020   PLT 421.0 (H) 03/13/2020   Last metabolic panel Lab Results  Component Value Date   GLUCOSE 95 04/09/2023   NA 141 04/09/2023   K 4.5 04/09/2023   CL 102 04/09/2023   CO2 27 04/09/2023   BUN 18 04/09/2023   CREATININE 0.75 04/09/2023   GFR 80.37 04/09/2023   CALCIUM  9.4 04/09/2023   PROT 7.9 04/09/2023   ALBUMIN 4.7 04/09/2023   LABGLOB 3.0 08/17/2018   AGRATIO 1.6 08/17/2018   BILITOT 0.3 04/09/2023   ALKPHOS 47 04/09/2023   AST 69 (H) 04/09/2023   ALT 60 (H) 04/09/2023   ANIONGAP 10 02/12/2016   Last hemoglobin A1c Lab Results  Component Value Date   HGBA1C  6.2 04/09/2023   Last thyroid  functions Lab Results  Component Value Date   TSH 5.04 07/13/2022      Objective:  BP (!) 162/82   Pulse 75   Temp 98.7 F (37.1 C) (Temporal)   Ht 5\' 3"  (1.6 m)   Wt 182 lb (82.6 kg)   SpO2 95%   BMI 32.24 kg/m      Physical Exam Vitals and nursing note reviewed.  Cardiovascular:     Rate and Rhythm: Normal rate.     Pulses: Normal pulses.  Pulmonary:     Effort: Pulmonary effort is normal.  Musculoskeletal:     Right lower leg: No edema.     Left lower leg: No edema.  Neurological:     Mental Status: She is alert and oriented to person, place, and time.     No results found for any visits on 04/28/23.    Assessment & Plan:    Problem List Items Addressed This Visit     DM (diabetes mellitus) (HCC) - Primary   DIABETES controlled with hgbA1c at 6.2% She will like to resume ozempic  in place of metformin .  Ozempic  0.25mg  weekly sent Stop metfomin if able to obtain ozempic .      Relevant Medications   Semaglutide ,0.25 or 0.5MG /DOS, (OZEMPIC , 0.25 OR 0.5 MG/DOSE,) 2 MG/3ML SOPN   Blood Glucose Monitoring Suppl DEVI   Glucose Blood (BLOOD GLUCOSE TEST STRIPS) STRP   Lancet Device MISC   Lancets Misc. MISC   Essential hypertension   BP not at goal Associated with dizziness in AM only, resolves within 1hr of waking up. Negative ortho vital signs. BP Readings from Last 3 Encounters:  04/28/23 (!) 162/82  04/09/23 130/72  10/12/22 135/71    Increase amlodipine  dose to 10mg  Maintain lisinopril  and metoprolol  dose Send home BP readings via mychart in 2weeks Advised to maintain DASh diet and avoid ALCOHOL intake F/up in 3months      Relevant Medications   amLODipine  (NORVASC ) 10 MG tablet   Return in about 3 months (around 07/28/2023) for HTN, DM, hyperlipidemia (fasting).     Kathrene Parents, NP

## 2023-04-28 NOTE — Patient Instructions (Signed)
 Monitor BP and glucose in AM Send BP readings via mychart once a week. Decrease ALCOHOL consumption to 0-1drink daily. Maintain 60-64oz of water daily.

## 2023-04-28 NOTE — Telephone Encounter (Signed)
 Called and left a voice message per DPR for patient asking to give me a call back at the office at [phone]. I will try calling patient again.

## 2023-04-30 NOTE — Telephone Encounter (Signed)
 The patient has been notified of provider comments and verbalized understanding. All questions (if any) were answered.

## 2023-05-04 ENCOUNTER — Other Ambulatory Visit: Payer: Self-pay | Admitting: Licensed Clinical Social Worker

## 2023-05-04 NOTE — Patient Instructions (Signed)
 Visit Information  Thank you for taking time to visit with me today. Please don't hesitate to contact me if I can be of assistance to you before our next scheduled appointment.   Patient has met all care management goals. Care Management case will be closed. Patient has been provided contact information should new needs arise.   Please call the care guide team at [phone] if you need to cancel, schedule, or reschedule an appointment.   Please call 911 if you are experiencing a Mental Health or Behavioral Health Crisis or need someone to talk to.  Gwyndolyn Saxon MSW, LCSW Licensed Clinical Social Worker  [facility], Population Health Direct Dial: [phone]  Fax: [phone]

## 2023-05-04 NOTE — Patient Outreach (Signed)
 Complex Care Management   Visit Note  05/04/2023  Name:  Diamond Hughes MRN: [id number] DOB: 1950-07-19  Situation: Referral received for Complex Care Management related to [facility]/Behavioral Health diagnosis depressed mood  I obtained verbal consent from Patient.  Visit completed with L Hoe  on the phone  Background:   Past Medical History:  Diagnosis Date   CAD (coronary artery disease)    a. 2000 s/p MI and RCA PCI [facility]);  b. 02/2016 NSTEMI/PCI: LM nl, LAD nl, LCX 100p (3.5x18 Resolute Onyx DES), OM1/3 small, RCA 70p aneurysmal, 78m/d ISR-->attempted PCI, could not cross w/ wire-->med rx, EF 45-50%.   Hyperlipidemia    Hypertension    Ischemic cardiomyopathy    a. 02/2016 Echo: EF 45-50%, basal-mid inferior/posterior HK, Gr1 DD, triv MR, nl LA size, nl RV fxn.   Normocytic anemia     Assessment: Patient Reported Symptoms:  Cognitive Cognitive Status: Able to follow simple commands Cognitive/Intellectual Conditions Management [RPT]: Not Assessed   Health Maintenance Behaviors: None  Neurological Neurological Review of Symptoms: No symptoms reported    HEENT HEENT Symptoms Reported: Not assessed      Cardiovascular      Respiratory Respiratory Symptoms Reported: Not assesed    Endocrine Patient reports the following symptoms related to hypoglycemia or hyperglycemia : No symptoms reported    Gastrointestinal Gastrointestinal Symptoms Reported: No symptoms reported   Nutrition Risk Screen (CP): No indicators present  Genitourinary      Integumentary      Musculoskeletal          Psychosocial              04/28/2023   10:36 AM  Depression screen PHQ 2/9  Decreased Interest 1  Down, Depressed, Hopeless 1  PHQ - 2 Score 2  Altered sleeping 1  Tired, decreased energy 0  Change in appetite 0  Feeling bad or failure about yourself  0  Trouble concentrating 0  Moving slowly or fidgety/restless 0  Suicidal thoughts 0  PHQ-9 Score 3  Difficult doing  work/chores Not difficult at all    There were no vitals filed for this visit.  Medications Reviewed Today   Medications were not reviewed in this encounter     Recommendation:   Pt report mood has improved due to implementation of self care techniques   Follow Up Plan:   Patient has met all care management goals. Care Management case will be closed. Patient has been provided contact information should new needs arise.  Fletcher Humble MSW, LCSW Licensed Clinical Social Worker  [facility], Population Health Direct Dial: [phone]  Fax: [phone]

## 2023-05-08 ENCOUNTER — Other Ambulatory Visit: Payer: Self-pay | Admitting: Nurse Practitioner

## 2023-05-12 ENCOUNTER — Other Ambulatory Visit: Payer: Self-pay | Admitting: Nurse Practitioner

## 2023-05-12 NOTE — Telephone Encounter (Unsigned)
 Copied from CRM [id number]. Topic: Clinical - Medication Refill >> May 12, 2023  3:26 PM Alyse July wrote: Medication: metoprolol  tartrate (LOPRESSOR ) 50 MG tablet  Has the patient contacted their pharmacy? Yes (Agent: If no, request that the patient contact the pharmacy for the refill. If patient does not wish to contact the pharmacy document the reason why and proceed with request.) (Agent: If yes, when and what did the pharmacy advise?)  This is the patient's preferred pharmacy:  [id number] - [place], [place] - [address] AT [address] [address] [place] [address] Phone: [phone] Fax: [phone]  Is this the correct pharmacy for this prescription? Yes If no, delete pharmacy and type the correct one.   Has the prescription been filled recently? No  Is the patient out of the medication? No  Has the patient been seen for an appointment in the last year OR does the patient have an upcoming appointment? Yes  Can we respond through MyChart? No  Agent: Please be advised that Rx refills may take up to 3 business days. We ask that you follow-up with your pharmacy.

## 2023-05-12 NOTE — Telephone Encounter (Signed)
 Last Fill: 01/13/23  Last OV: 04/28/23 Next OV: 07/28/23  Routing to provider for review/authorization.

## 2023-05-13 ENCOUNTER — Other Ambulatory Visit: Payer: Self-pay | Admitting: Nurse Practitioner

## 2023-05-13 DIAGNOSIS — J0140 Acute pansinusitis, unspecified: Secondary | ICD-10-CM

## 2023-05-24 ENCOUNTER — Telehealth: Payer: Self-pay

## 2023-05-24 NOTE — Telephone Encounter (Signed)
 Copied from CRM [id number]. Topic: Clinical - Medical Advice >> May 24, 2023  9:18 AM Dimple Francis wrote: Reason for CRM: Patient wanting to send in her BP for the last couple weeks. 4/26-139/81 ; 4/28- 121/75  ;  4/29- 122/77  ; 4/30- 124/79    ;   5/2-129/81   ;  5/5-122/73   ; 5/6-127/78  ;  5/7-119/76  ;  5/8-124/78    ;   5/10- 126/80   ;  5/13 -123/79  ;  5/14 -134/83  ;  5/17-123/81

## 2023-05-24 NOTE — Telephone Encounter (Signed)
 Called and left a detailed voice message per DPR on file of Charlotte's comments of maintaining current BP medication doses and to give me a call back at the office at [phone] if she has any questions.

## 2023-05-27 ENCOUNTER — Other Ambulatory Visit: Payer: Self-pay | Admitting: Nurse Practitioner

## 2023-05-27 DIAGNOSIS — I10 Essential (primary) hypertension: Secondary | ICD-10-CM

## 2023-06-01 ENCOUNTER — Other Ambulatory Visit: Payer: Self-pay | Admitting: Nurse Practitioner

## 2023-06-29 ENCOUNTER — Ambulatory Visit: Payer: Self-pay

## 2023-06-29 NOTE — Telephone Encounter (Signed)
 FYI Only or Action Required?: FYI only for provider.  Patient was last seen in primary care on 04/28/2023 by Nche, Roselie Rockford, NP. Called Nurse Triage reporting Abdominal Pain. Symptoms began yesterday. Interventions attempted: Nothing. Symptoms are: stable.  Triage Disposition: See Physician Within 24 Hours  Patient/caregiver understands and will follow disposition?: Yes  No appts until 07/01/23, advised UC. Appt scheduled UC tomorrow 0830.   Copied from CRM [id number]. Topic: Clinical - Red Word Triage >> Jun 29, 2023  4:12 PM Burnard DEL wrote: Red Word that prompted transfer to Nurse Triage: stomach pain/bloody diarrhea Reason for Disposition  [1] MODERATE pain (e.g., interferes with normal activities) AND [2] pain comes and goes (cramps) AND [3] present > 24 hours  (Exception: Pain with Vomiting or Diarrhea - see that Guideline.)  Answer Assessment - Initial Assessment Questions 1. LOCATION: Where does it hurt?      L lower side  3. ONSET: When did the pain begin? (e.g., minutes, hours or days ago)      Last night  5. PATTERN Does the pain come and go, or is it constant?    - If it comes and goes: How long does it last? Do you have pain now?     (Note: Comes and goes means the pain is intermittent. It goes away completely between bouts.)    - If constant: Is it getting better, staying the same, or getting worse?      (Note: Constant means the pain never goes away completely; most serious pain is constant and gets worse.)      Comes and goes  6. SEVERITY: How bad is the pain?  (e.g., Scale 1-10; mild, moderate, or severe)    - MILD (1-3): Doesn't interfere with normal activities, abdomen soft and not tender to touch.     - MODERATE (4-7): Interferes with normal activities or awakens from sleep, abdomen tender to touch.     - SEVERE (8-10): Excruciating pain, doubled over, unable to do any normal activities.       6/10 7. RECURRENT SYMPTOM: Have you ever had this type of  stomach pain before? If Yes, ask: When was the last time? and What happened that time?      no 10. OTHER SYMPTOMS: Do you have any other symptoms? (e.g., back pain, diarrhea, fever, urination pain, vomiting)       Bloody diarrhea  Protocols used: Abdominal Pain - Female-A-AH

## 2023-06-29 NOTE — Telephone Encounter (Addendum)
 Noted. Patient advised to go to urgent care by nurse triage due to no available appointments.

## 2023-06-30 ENCOUNTER — Ambulatory Visit
Admission: RE | Admit: 2023-06-30 | Discharge: 2023-06-30 | Disposition: A | Discharge status: Home / Self Care | Source: Ambulatory Visit

## 2023-06-30 VITALS — BP 124/76 | HR 85 | Temp 98.2°F | Resp 16

## 2023-06-30 DIAGNOSIS — K648 Other hemorrhoids: Secondary | ICD-10-CM | POA: Diagnosis not present

## 2023-06-30 MED ORDER — HYDROCORTISONE (PERIANAL) 2.5 % EX CREA: 1.0000 | TOPICAL_CREAM | Freq: Two times a day (BID) | CUTANEOUS | 0 refills | Status: DC

## 2023-06-30 NOTE — Discharge Instructions (Signed)
  1. Internal hemorrhoid, bleeding (Primary) - hydrocortisone (ANUSOL-HC) 2.5 % rectal cream; Place 1 Application rectally 2 (two) times daily.  Dispense: 30 g; Refill: 0 - Continue to monitor current symptoms if there is any escalation of current symptoms or development of new symptoms follow-up in ER for further evaluation and management.

## 2023-06-30 NOTE — ED Provider Notes (Addendum)
 [facility]  Note:  This document was prepared using Dragon voice recognition software and may include unintentional dictation errors.  MRN: [id number] DOB: [birth year]  Subjective:   Diamond Hughes is a 73 y.o. female presenting for blood with stools since Monday.  Patient reports that Monday after eating she began having lower abdominal discomfort cramping after which she had a bloody bowel movement.  Patient describes bowel movement as being blood clots starting to the bottom of the throat.  Patient reports mild burning sensation to her rectum with bowel movements.  Patient denies any past history of colon cancer, diverticulosis, GI bleeds.  Patient reports that the blood only usually comes out when she strains to go to the bathroom.  Patient has not taken any over-the-counter patient to treat symptoms.  Patient denies any pain or rectal irritation at this time.  Patient reports that she has done Cologuard regularly for the past several years.  Patient states that recent Cologuard testing was negative.  No current facility-administered medications for this encounter.  Current Outpatient Medications:    hydrocortisone (ANUSOL-HC) 2.5 % rectal cream, Place 1 Application rectally 2 (two) times daily., Disp: 30 g, Rfl: 0   allopurinol  (ZYLOPRIM ) 100 MG tablet, Take 1 tablet (100 mg total) by mouth daily., Disp: 90 tablet, Rfl: 1   amLODipine  (NORVASC ) 10 MG tablet, Take 1 tablet (10 mg total) by mouth every evening., Disp: 90 tablet, Rfl: 3   aspirin  81 MG tablet, Take 1 tablet (81 mg total) by mouth daily., Disp: 30 tablet, Rfl: 6   atorvastatin  (LIPITOR ) 80 MG tablet, Take 1 tablet (80 mg total) by mouth daily at 6 PM., Disp: 90 tablet, Rfl: 3   Blood Glucose Monitoring Suppl DEVI, 1 each by Does not apply route daily before breakfast. May substitute to any manufacturer covered by patient's insurance., Disp: 1 each, Rfl: 0   clopidogrel  (PLAVIX ) 75 MG tablet, Take 1 tablet  (75 mg total) by mouth daily with breakfast., Disp: 90 tablet, Rfl: 1   co-enzyme Q-10 30 MG capsule, Take 30 mg by mouth 3 (three) times daily., Disp: , Rfl:    ezetimibe  (ZETIA ) 10 MG tablet, TAKE 1 TABLET BY MOUTH EVERY DAY, Disp: 90 tablet, Rfl: 1   fluticasone  (FLONASE ) 50 MCG/ACT nasal spray, SHAKE LIQUID AND USE 2 SPRAYS IN EACH NOSTRIL DAILY, Disp: 48 g, Rfl: 0   Glucose Blood (BLOOD GLUCOSE TEST STRIPS) STRP, 1 each by In Vitro route daily before breakfast. May substitute to any manufacturer covered by patient's insurance., Disp: 100 strip, Rfl: 3   levothyroxine  (SYNTHROID ) 75 MCG tablet, Take 1 tablet (75 mcg total) by mouth daily before breakfast., Disp: 90 tablet, Rfl: 1   lisinopril  (ZESTRIL ) 40 MG tablet, Take 1 tablet (40 mg total) by mouth daily., Disp: 90 tablet, Rfl: 1   loratadine  (CLARITIN ) 10 MG tablet, Take 1 tablet (10 mg total) by mouth daily., Disp: 30 tablet, Rfl: 0   metFORMIN  (GLUCOPHAGE ) 500 MG tablet, Take 0.5 tablets (250 mg total) by mouth daily with breakfast., Disp: 45 tablet, Rfl: 3   metoprolol  tartrate (LOPRESSOR ) 50 MG tablet, TAKE 1 TABLET(50 MG) BY MOUTH TWICE DAILY, Disp: 180 tablet, Rfl: 0   Misc Natural Products (BEET ROOT PO), Take by mouth. Powder mixed with water, Disp: , Rfl:    nitroGLYCERIN  (NITROSTAT ) 0.4 MG SL tablet, Place 1 tablet (0.4 mg total) under the tongue every 5 (five) minutes x 3 doses as needed for chest pain., Disp: 25  tablet, Rfl: 3   Omega-3 Fatty Acids (OMEGA-3 PO), Take 1 capsule by mouth daily., Disp: , Rfl:    Semaglutide ,0.25 or 0.5MG /DOS, (OZEMPIC , 0.25 OR 0.5 MG/DOSE,) 2 MG/3ML SOPN, Inject 0.25 mg into the skin once a week., Disp: 2 mL, Rfl: 5   VITAMIN D  PO, Take by mouth., Disp: , Rfl:    No Known Allergies  Past Medical History:  Diagnosis Date   CAD (coronary artery disease)    a. 2000 s/p MI and RCA PCI [facility]);  b. 02/2016 NSTEMI/PCI: LM nl, LAD nl, LCX 100p (3.5x18 Resolute Onyx DES), OM1/3 small, RCA 70p  aneurysmal, 25m/d ISR-->attempted PCI, could not cross w/ wire-->med rx, EF 45-50%.   Hyperlipidemia    Hypertension    Ischemic cardiomyopathy    a. 02/2016 Echo: EF 45-50%, basal-mid inferior/posterior HK, Gr1 DD, triv MR, nl LA size, nl RV fxn.   Normocytic anemia      Past Surgical History:  Procedure Laterality Date   ANGIOPLASTY  2000   CORONARY STENT INTERVENTION N/A 02/10/2016   Procedure: Coronary Stent Intervention;  Surgeon: Victory LELON Sharps, MD;  Location[facility] INVASIVE CV LAB;  Service: Cardiovascular;  Laterality: N/A;   LEFT HEART CATH AND CORONARY ANGIOGRAPHY N/A 02/10/2016   Procedure: Left Heart Cath and Coronary Angiography;  Surgeon: Victory LELON Sharps, MD;  Location: [facility] INVASIVE CV LAB;  Service: Cardiovascular;  Laterality: N/A;    Family History  Problem Relation Age of Onset   Heart disease Mother    Hyperlipidemia Mother    Hypertension Mother    Hyperlipidemia Sister    Hypertension Sister    Breast cancer Maternal Grandmother     Social History   Tobacco Use   Smoking status: Former    Current packs/day: 0.20    Average packs/day: 0.2 packs/day for 15.0 years (3.0 ttl pk-yrs)    Types: Cigarettes   Smokeless tobacco: Never  Vaping Use   Vaping status: Never Used  Substance Use Topics   Alcohol use: Yes    Alcohol/week: 7.0 standard drinks of alcohol    Types: 7 Cans of beer per week   Drug use: No    ROS Refer to HPI for ROS details.  Objective:    Vitals: BP 124/76 (BP Location: Right Arm)   Pulse 85   Temp 98.2 F (36.8 C) (Oral)   Resp 16   SpO2 96%   Physical Exam Vitals and nursing note reviewed.  Constitutional:      General: She is not in acute distress.    Appearance: She is well-developed. She is not ill-appearing or toxic-appearing.  HENT:     Head: Normocephalic and atraumatic.   Cardiovascular:     Rate and Rhythm: Normal rate.  Pulmonary:     Effort: Pulmonary effort is normal. No respiratory distress.  Abdominal:      General: There is no distension.     Palpations: Abdomen is soft.     Tenderness: There is no abdominal tenderness. There is no right CVA tenderness, left CVA tenderness, guarding or rebound.   Skin:    General: Skin is warm and dry.   Neurological:     General: No focal deficit present.     Mental Status: She is alert and oriented to person, place, and time.   Psychiatric:        Mood and Affect: Mood normal.        Behavior: Behavior normal.     Procedures  No results  found for this or any previous visit (from the past 24 hours).  Assessment and Plan :     Discharge Instructions       1. Internal hemorrhoid, bleeding (Primary) - hydrocortisone (ANUSOL-HC) 2.5 % rectal cream; Place 1 Application rectally 2 (two) times daily.  Dispense: 30 g; Refill: 0 - Continue to monitor current symptoms if there is any escalation of current symptoms or development of new symptoms follow-up in ER for further evaluation and management.      Ryleigh Buenger B Signe Tackitt   Corlene Sabia, [place] B, NP 06/30/23 0939    Jessa Stinson B, NP 06/30/23 [phone]

## 2023-06-30 NOTE — ED Triage Notes (Signed)
 Pt states on Monday 3hrs after eating she began to have lower abdominal pain. Since then she has been having bloody bowel movements.

## 2023-07-08 ENCOUNTER — Other Ambulatory Visit: Payer: Self-pay | Admitting: Nurse Practitioner

## 2023-07-08 DIAGNOSIS — E782 Mixed hyperlipidemia: Secondary | ICD-10-CM

## 2023-07-21 ENCOUNTER — Other Ambulatory Visit: Payer: Self-pay | Admitting: Nurse Practitioner

## 2023-07-21 DIAGNOSIS — M109 Gout, unspecified: Secondary | ICD-10-CM

## 2023-07-28 ENCOUNTER — Ambulatory Visit: Admitting: Nurse Practitioner

## 2023-08-04 NOTE — Telephone Encounter (Signed)
 Average home blood pressure readings for May

## 2023-08-09 ENCOUNTER — Encounter: Payer: Self-pay | Admitting: Nurse Practitioner

## 2023-08-09 ENCOUNTER — Ambulatory Visit (INDEPENDENT_AMBULATORY_CARE_PROVIDER_SITE_OTHER): Admitting: Nurse Practitioner

## 2023-08-09 VITALS — BP 136/66 | HR 78 | Temp 98.3°F | Ht 63.0 in | Wt 181.4 lb

## 2023-08-09 DIAGNOSIS — E790 Hyperuricemia without signs of inflammatory arthritis and tophaceous disease: Secondary | ICD-10-CM

## 2023-08-09 DIAGNOSIS — E1169 Type 2 diabetes mellitus with other specified complication: Secondary | ICD-10-CM | POA: Diagnosis not present

## 2023-08-09 DIAGNOSIS — E039 Hypothyroidism, unspecified: Secondary | ICD-10-CM | POA: Diagnosis not present

## 2023-08-09 DIAGNOSIS — Z7985 Long-term (current) use of injectable non-insulin antidiabetic drugs: Secondary | ICD-10-CM

## 2023-08-09 DIAGNOSIS — I10 Essential (primary) hypertension: Secondary | ICD-10-CM

## 2023-08-09 DIAGNOSIS — E785 Hyperlipidemia, unspecified: Secondary | ICD-10-CM | POA: Diagnosis not present

## 2023-08-09 LAB — COMPREHENSIVE METABOLIC PANEL WITH GFR
ALT: 17 U/L (ref 0–35)
AST: 21 U/L (ref 0–37)
Albumin: 4.6 g/dL (ref 3.5–5.2)
Alkaline Phosphatase: 41 U/L (ref 39–117)
BUN: 16 mg/dL (ref 6–23)
CO2: 28 meq/L (ref 19–32)
Calcium: 9.8 mg/dL (ref 8.4–10.5)
Chloride: 99 meq/L (ref 96–112)
Creatinine, Ser: 0.55 mg/dL (ref 0.40–1.20)
GFR: 92.32 mL/min (ref >60.00)
Glucose, Bld: 101 mg/dL — ABNORMAL HIGH (ref 70–99)
Potassium: 4 meq/L (ref 3.5–5.1)
Sodium: 136 meq/L (ref 135–145)
Total Bilirubin: 0.4 mg/dL (ref 0.2–1.2)
Total Protein: 7.7 g/dL (ref 6.0–8.3)

## 2023-08-09 LAB — MICROALBUMIN / CREATININE URINE RATIO
Creatinine,U: 32.7 mg/dL
Microalb Creat Ratio: 31.4 mg/g — ABNORMAL HIGH (ref 0.0–30.0)
Microalb, Ur: 1 mg/dL (ref 0.0–1.9)

## 2023-08-09 LAB — T4, FREE: Free T4: 0.75 ng/dL (ref 0.60–1.60)

## 2023-08-09 LAB — TSH: TSH: 2 u[IU]/mL (ref 0.35–5.50)

## 2023-08-09 LAB — HEMOGLOBIN A1C: Hgb A1c MFr Bld: 6.1 % (ref 4.6–6.5)

## 2023-08-09 LAB — URIC ACID: Uric Acid, Serum: 4.7 mg/dL (ref 2.4–7.0)

## 2023-08-09 LAB — LDL CHOLESTEROL, DIRECT: Direct LDL: 60 mg/dL

## 2023-08-09 NOTE — Assessment & Plan Note (Signed)
 Repeat uric acid No gout exacerbation

## 2023-08-09 NOTE — Assessment & Plan Note (Signed)
 Advised to have DIABETES eye exam reports faxed to me. Normal foot exam Positive UACr, current use of an ACE and GLP-1RA BP at goal No adverse effects with ozempic   Repeat hgbA1c, UACr, and CMP F/up in 19month

## 2023-08-09 NOTE — Progress Notes (Signed)
 Established Patient Visit  Patient: Diamond Hughes   DOB: 1950/08/21   73 y.o. Female  MRN: [id number] Visit Date: 08/09/2023  Subjective:    Chief Complaint  Patient presents with   Follow-up    3 month follow up for DM, HTN, Hyperlipidemia  Refill needed for Zetia     HPI Essential hypertension BP at goal BP Readings from Last 3 Encounters:  08/09/23 136/66  06/30/23 124/76  08/04/23 125/78    Maintain amlodipine , lisinopril , and metoprolol  dose Advised to maintain daily exercise, and DASh diet  F/up in 83months  Acquired hypothyroidism Repeat TSH and T4:  maintain levothyroxine  dose.   DM (diabetes mellitus) (HCC) Advised to have DIABETES eye exam reports faxed to me. Normal foot exam Positive UACr, current use of an ACE and GLP-1RA BP at goal No adverse effects with ozempic   Repeat hgbA1c, UACr, and CMP F/up in 83month  Hyperlipidemia associated with type 2 diabetes mellitus (HCC) Repeat CMP and LDL today  Hyperuricemia Repeat uric acid No gout exacerbation  Reviewed medical, surgical, and social history today  Medications: Outpatient Medications Prior to Visit  Medication Sig   allopurinol  (ZYLOPRIM ) 100 MG tablet TAKE 1 TABLET(100 MG) BY MOUTH DAILY   amLODipine  (NORVASC ) 10 MG tablet Take 1 tablet (10 mg total) by mouth every evening.   aspirin  81 MG tablet Take 1 tablet (81 mg total) by mouth daily.   atorvastatin  (LIPITOR ) 80 MG tablet Take 1 tablet (80 mg total) by mouth daily at 6 PM.   Blood Glucose Monitoring Suppl DEVI 1 each by Does not apply route daily before breakfast. May substitute to any manufacturer covered by patient's insurance.   clopidogrel  (PLAVIX ) 75 MG tablet Take 1 tablet (75 mg total) by mouth daily with breakfast.   co-enzyme Q-10 30 MG capsule Take 30 mg by mouth 3 (three) times daily.   ezetimibe  (ZETIA ) 10 MG tablet TAKE 1 TABLET BY MOUTH EVERY DAY   fluticasone  (FLONASE ) 50 MCG/ACT nasal spray SHAKE LIQUID AND  USE 2 SPRAYS IN EACH NOSTRIL DAILY   Glucose Blood (BLOOD GLUCOSE TEST STRIPS) STRP 1 each by In Vitro route daily before breakfast. May substitute to any manufacturer covered by patient's insurance.   hydrocortisone  (ANUSOL -HC) 2.5 % rectal cream Place 1 Application rectally 2 (two) times daily.   levothyroxine  (SYNTHROID ) 75 MCG tablet Take 1 tablet (75 mcg total) by mouth daily before breakfast.   lisinopril  (ZESTRIL ) 40 MG tablet Take 1 tablet (40 mg total) by mouth daily.   loratadine  (CLARITIN ) 10 MG tablet Take 1 tablet (10 mg total) by mouth daily.   metoprolol  tartrate (LOPRESSOR ) 50 MG tablet TAKE 1 TABLET(50 MG) BY MOUTH TWICE DAILY   Misc Natural Products (BEET ROOT PO) Take by mouth. Powder mixed with water   nitroGLYCERIN  (NITROSTAT ) 0.4 MG SL tablet Place 1 tablet (0.4 mg total) under the tongue every 5 (five) minutes x 3 doses as needed for chest pain.   Omega-3 Fatty Acids (OMEGA-3 PO) Take 1 capsule by mouth daily.   Semaglutide ,0.25 or 0.5MG /DOS, (OZEMPIC , 0.25 OR 0.5 MG/DOSE,) 2 MG/3ML SOPN Inject 0.25 mg into the skin once a week.   VITAMIN D  PO Take by mouth.   metFORMIN  (GLUCOPHAGE ) 500 MG tablet Take 0.5 tablets (250 mg total) by mouth daily with breakfast. (Patient not taking: Reported on 08/09/2023)   No facility-administered medications prior to visit.   Reviewed past medical and  social history.   ROS per HPI above      Objective:  BP 136/66 (BP Location: Left Arm, Patient Position: Sitting, Cuff Size: Large)   Pulse 78   Temp 98.3 F (36.8 C) (Oral)   Ht 5' 3 (1.6 m)   Wt 181 lb 6.4 oz (82.3 kg)   SpO2 99%   BMI 32.13 kg/m      Physical Exam Vitals and nursing note reviewed.  Cardiovascular:     Rate and Rhythm: Normal rate and regular rhythm.     Pulses: Normal pulses.     Heart sounds: Normal heart sounds.  Pulmonary:     Effort: Pulmonary effort is normal.     Breath sounds: Normal breath sounds.  Musculoskeletal:     Right lower leg: No  edema.     Left lower leg: No edema.  Neurological:     Mental Status: She is alert and oriented to person, place, and time.     No results found for any visits on 08/09/23.    Assessment & Plan:    Problem List Items Addressed This Visit     Acquired hypothyroidism   Repeat TSH and T4:  maintain levothyroxine  dose.       Relevant Orders   TSH   T4, free   DM (diabetes mellitus) (HCC) - Primary   Advised to have DIABETES eye exam reports faxed to me. Normal foot exam Positive UACr, current use of an ACE and GLP-1RA BP at goal No adverse effects with ozempic   Repeat hgbA1c, UACr, and CMP F/up in 75month      Relevant Orders   Hemoglobin A1c   Microalbumin / creatinine urine ratio   Comprehensive metabolic panel with GFR   Essential hypertension   BP at goal BP Readings from Last 3 Encounters:  08/09/23 136/66  06/30/23 124/76  08/04/23 125/78    Maintain amlodipine , lisinopril , and metoprolol  dose Advised to maintain daily exercise, and DASh diet  F/up in 75months      Hyperlipidemia associated with type 2 diabetes mellitus (HCC)   Repeat CMP and LDL today      Relevant Orders   Direct LDL   Comprehensive metabolic panel with GFR   Hyperuricemia   Repeat uric acid No gout exacerbation      Relevant Orders   Uric acid   Return in about 3 months (around 11/09/2023) for HTN, DM, hyperlipidemia (fasting).     Roselie Mood, NP

## 2023-08-09 NOTE — Assessment & Plan Note (Signed)
 BP at goal BP Readings from Last 3 Encounters:  08/09/23 136/66  06/30/23 124/76  08/04/23 125/78    Maintain amlodipine , lisinopril , and metoprolol  dose Advised to maintain daily exercise, and DASh diet  F/up in 3months

## 2023-08-09 NOTE — Assessment & Plan Note (Signed)
Repeat TSH and T4 °maintain levothyroxine dose °

## 2023-08-09 NOTE — Assessment & Plan Note (Signed)
 Repeat CMP and LDL today

## 2023-08-09 NOTE — Patient Instructions (Signed)
 Go to lab Maintain Heart healthy diet and daily exercise. Maintain current medications.

## 2023-08-12 ENCOUNTER — Other Ambulatory Visit: Payer: Self-pay | Admitting: Nurse Practitioner

## 2023-08-12 ENCOUNTER — Telehealth: Payer: Self-pay

## 2023-08-12 NOTE — Telephone Encounter (Signed)
 Called patient and made her aware that Roselie reviews  lab results as they come into her. Once she review I will give her a call with Charlotte's comments an/or recommendations. She thanked me for calling and all (if any) questions were answered.

## 2023-08-12 NOTE — Telephone Encounter (Signed)
 Copied from CRM [id number]. Topic: Clinical - Lab/Test Results >> Aug 12, 2023 10:24 AM Berneda FALCON wrote: Reason for CRM: Patient would like a callback for her lab results please.   Patient callback is [phone]

## 2023-08-13 ENCOUNTER — Ambulatory Visit: Payer: Self-pay | Admitting: Nurse Practitioner

## 2023-08-13 DIAGNOSIS — E039 Hypothyroidism, unspecified: Secondary | ICD-10-CM

## 2023-08-13 DIAGNOSIS — E0821 Diabetes mellitus due to underlying condition with diabetic nephropathy: Secondary | ICD-10-CM

## 2023-08-13 MED ORDER — LEVOTHYROXINE SODIUM 75 MCG PO TABS: 75.0000 ug | ORAL_TABLET | Freq: Every day | ORAL | 1 refills | Status: AC

## 2023-08-13 MED ORDER — OZEMPIC (0.25 OR 0.5 MG/DOSE) 2 MG/3ML ~~LOC~~ SOPN: 0.5000 mg | PEN_INJECTOR | SUBCUTANEOUS | 5 refills | Status: AC

## 2023-08-13 NOTE — Assessment & Plan Note (Signed)
 Improved urine microalbumin, but not at goal. Increase ozempic  dose to 0.5mg  weekly. Stable hgbA1c at 6.1% Stable renal, liver, and thyroid  function Normal uric uric acid LDL at goal F/up in 3months as discussed

## 2023-08-27 ENCOUNTER — Ambulatory Visit (INDEPENDENT_AMBULATORY_CARE_PROVIDER_SITE_OTHER): Payer: Medicare Other

## 2023-08-27 ENCOUNTER — Other Ambulatory Visit: Payer: Self-pay

## 2023-08-27 VITALS — BP 122/68 | HR 87 | Temp 98.2°F | Ht 62.5 in | Wt 180.4 lb

## 2023-08-27 DIAGNOSIS — Z0000 Encounter for general adult medical examination without abnormal findings: Secondary | ICD-10-CM

## 2023-08-27 DIAGNOSIS — E782 Mixed hyperlipidemia: Secondary | ICD-10-CM

## 2023-08-27 NOTE — Patient Instructions (Signed)
 Ms. Fleer , Thank you for taking time out of your busy schedule to complete your Annual Wellness Visit with me. I enjoyed our conversation and look forward to speaking with you again next year. I, as well as your care team,  appreciate your ongoing commitment to your health goals. Please review the following plan we discussed and let me know if I can assist you in the future. Your Game plan/ To Do List    Referrals: If you haven't heard from the office you've been referred to, please reach out to them at the phone provided.   Follow up Visits: We will see or speak with you next year for your Next Medicare AWV with our clinical staff Have you seen your provider in the last 6 months (3 months if uncontrolled diabetes)? Yes  Clinician Recommendations:  Aim for 30 minutes of exercise or brisk walking, 6-8 glasses of water, and 5 servings of fruits and vegetables each day.       This is a list of the screenings recommended for you:  Health Maintenance  Topic Date Due   Pneumococcal Vaccine for age over 29 (1 of 2 - PCV) Never done   Zoster (Shingles) Vaccine (1 of 2) Never done   COVID-19 Vaccine (5 - 2024-25 season) 09/06/2022   Eye exam for diabetics  03/06/2023   Flu Shot  08/06/2023   DTaP/Tdap/Td vaccine (1 - Tdap) 04/27/2024*   Mammogram  11/16/2023   Hemoglobin A1C  02/09/2024   Yearly kidney function blood test for diabetes  08/08/2024   Yearly kidney health urinalysis for diabetes  08/08/2024   Complete foot exam   08/08/2024   Medicare Annual Wellness Visit  08/26/2024   Cologuard (Stool DNA test)  03/30/2026   DEXA scan (bone density measurement)  Completed   Hepatitis C Screening  Completed   HPV Vaccine  Aged Out   Meningitis B Vaccine  Aged Out   Colon Cancer Screening  Discontinued  *Topic was postponed. The date shown is not the original due date.    Advanced directives: (Declined) Advance directive discussed with you today. Even though you declined this today, please  call our office should you change your mind, and we can give you the proper paperwork for you to fill out. Advance Care Planning is important because it:  [x]  Makes sure you receive the medical care that is consistent with your values, goals, and preferences  [x]  It provides guidance to your family and loved ones and reduces their decisional burden about whether or not they are making the right decisions based on your wishes.  Follow the link provided in your after visit summary or read over the paperwork we have mailed to you to help you started getting your Advance Directives in place. If you need assistance in completing these, please reach out to us  so that we can help you!  See attachments for Preventive Care and Fall Prevention Tips.

## 2023-08-27 NOTE — Progress Notes (Signed)
 Subjective:   Diamond Hughes is a 73 y.o. who presents for a Medicare Wellness preventive visit.  As a reminder, Annual Wellness Visits don't include a physical exam, and some assessments may be limited, especially if this visit is performed virtually. We may recommend an in-person follow-up visit with your provider if needed.  Visit Complete: In person    Persons Participating in Visit: Patient.  AWV Questionnaire: No: Patient Medicare AWV questionnaire was not completed prior to this visit.  Cardiac Risk Factors include: advanced age (>82men, >39 women);diabetes mellitus;dyslipidemia;hypertension;obesity (BMI >30kg/m2)     Objective:    Today's Vitals   08/27/23 0829  BP: 122/68  Pulse: 87  Temp: 98.2 F (36.8 C)  TempSrc: Oral  SpO2: 97%  Weight: 180 lb 6.4 oz (81.8 kg)  Height: 5' 2.5 (1.588 m)   Body mass index is 32.47 kg/m.     08/27/2023    8:37 AM 10/27/2022   11:01 AM 08/24/2022    9:22 AM 05/07/2020    9:01 AM 01/25/2019    8:52 AM 02/13/2016    9:40 AM 02/08/2016   10:31 AM  Advanced Directives  Does Patient Have a Medical Advance Directive? No No Yes No No No  No   Does patient want to make changes to medical advance directive?   Yes (MAU/Ambulatory/Procedural Areas - Information given)      Would patient like information on creating a medical advance directive? No - Patient declined No - Patient declined  No - Patient declined No - Patient declined No - Patient declined  No - Patient declined      Data saved with a previous flowsheet row definition    Current Medications (verified) Outpatient Encounter Medications as of 08/27/2023  Medication Sig   allopurinol  (ZYLOPRIM ) 100 MG tablet TAKE 1 TABLET(100 MG) BY MOUTH DAILY   amLODipine  (NORVASC ) 10 MG tablet Take 1 tablet (10 mg total) by mouth every evening.   aspirin  81 MG tablet Take 1 tablet (81 mg total) by mouth daily.   atorvastatin  (LIPITOR ) 80 MG tablet Take 1 tablet (80 mg total) by mouth  daily at 6 PM.   Blood Glucose Monitoring Suppl DEVI 1 each by Does not apply route daily before breakfast. May substitute to any manufacturer covered by patient's insurance.   clopidogrel  (PLAVIX ) 75 MG tablet Take 1 tablet (75 mg total) by mouth daily with breakfast.   co-enzyme Q-10 30 MG capsule Take 30 mg by mouth 3 (three) times daily.   fluticasone  (FLONASE ) 50 MCG/ACT nasal spray SHAKE LIQUID AND USE 2 SPRAYS IN EACH NOSTRIL DAILY   Glucose Blood (BLOOD GLUCOSE TEST STRIPS) STRP 1 each by In Vitro route daily before breakfast. May substitute to any manufacturer covered by patient's insurance.   hydrocortisone  (ANUSOL -HC) 2.5 % rectal cream Place 1 Application rectally 2 (two) times daily.   levothyroxine  (SYNTHROID ) 75 MCG tablet Take 1 tablet (75 mcg total) by mouth daily before breakfast.   lisinopril  (ZESTRIL ) 40 MG tablet Take 1 tablet (40 mg total) by mouth daily.   loratadine  (CLARITIN ) 10 MG tablet Take 1 tablet (10 mg total) by mouth daily.   metoprolol  tartrate (LOPRESSOR ) 50 MG tablet TAKE 1 TABLET(50 MG) BY MOUTH TWICE DAILY   Misc Natural Products (BEET ROOT PO) Take by mouth. Powder mixed with water   nitroGLYCERIN  (NITROSTAT ) 0.4 MG SL tablet Place 1 tablet (0.4 mg total) under the tongue every 5 (five) minutes x 3 doses as needed for chest pain.  Omega-3 Fatty Acids (OMEGA-3 PO) Take 1 capsule by mouth daily.   Semaglutide ,0.25 or 0.5MG /DOS, (OZEMPIC , 0.25 OR 0.5 MG/DOSE,) 2 MG/3ML SOPN Inject 0.5 mg into the skin once a week.   ezetimibe  (ZETIA ) 10 MG tablet TAKE 1 TABLET BY MOUTH EVERY DAY (Patient not taking: Reported on 08/27/2023)   metFORMIN  (GLUCOPHAGE ) 500 MG tablet Take 0.5 tablets (250 mg total) by mouth daily with breakfast. (Patient not taking: Reported on 08/27/2023)   VITAMIN D  PO Take by mouth.   No facility-administered encounter medications on file as of 08/27/2023.    Allergies (verified) Patient has no known allergies.   History: Past Medical History:   Diagnosis Date   CAD (coronary artery disease)    a. 2000 s/p MI and RCA PCI [facility]);  b. 02/2016 NSTEMI/PCI: LM nl, LAD nl, LCX 100p (3.5x18 Resolute Onyx DES), OM1/3 small, RCA 70p aneurysmal, 73m/d ISR-->attempted PCI, could not cross w/ wire-->med rx, EF 45-50%.   Hyperlipidemia    Hypertension    Ischemic cardiomyopathy    a. 02/2016 Echo: EF 45-50%, basal-mid inferior/posterior HK, Gr1 DD, triv MR, nl LA size, nl RV fxn.   Normocytic anemia    Past Surgical History:  Procedure Laterality Date   ANGIOPLASTY  2000   CORONARY STENT INTERVENTION N/A 02/10/2016   Procedure: Coronary Stent Intervention;  Surgeon: Victory LELON Sharps, MD;  Location: [facility] INVASIVE CV LAB;  Service: Cardiovascular;  Laterality: N/A;   LEFT HEART CATH AND CORONARY ANGIOGRAPHY N/A 02/10/2016   Procedure: Left Heart Cath and Coronary Angiography;  Surgeon: Victory LELON Sharps, MD;  Location: [facility] INVASIVE CV LAB;  Service: Cardiovascular;  Laterality: N/A;   Family History  Problem Relation Age of Onset   Heart disease Mother    Hyperlipidemia Mother    Hypertension Mother    Hyperlipidemia Sister    Hypertension Sister    Breast cancer Maternal Grandmother    Social History   Socioeconomic History   Marital status: Married    Spouse name: Not on file   Number of children: 1   Years of education: Not on file   Highest education level: Not on file  Occupational History   Occupation: retired  Tobacco Use   Smoking status: Former    Current packs/day: 0.20    Average packs/day: 0.2 packs/day for 15.0 years (3.0 ttl pk-yrs)    Types: Cigarettes   Smokeless tobacco: Never  Vaping Use   Vaping status: Never Used  Substance and Sexual Activity   Alcohol use: Yes    Alcohol/week: 7.0 standard drinks of alcohol    Types: 7 Cans of beer per week   Drug use: No   Sexual activity: Yes    Birth control/protection: None  Other Topics Concern   Not on file  Social History Narrative   Not on file   Social Drivers  of Health   Financial Resource Strain: Low Risk  (08/27/2023)   Overall Financial Resource Strain (CARDIA)    Difficulty of Paying Living Expenses: Not hard at all  Food Insecurity: No Food Insecurity (08/27/2023)   Hunger Vital Sign    Worried About Running Out of Food in the Last Year: Never true    Ran Out of Food in the Last Year: Never true  Transportation Needs: No Transportation Needs (08/27/2023)   PRAPARE - Transportation    Lack of Transportation (Medical): No    Lack of Transportation (Non-Medical): No  Physical Activity: Sufficiently Active (08/27/2023)   Exercise Vital Sign  Days of Exercise per Week: 3 days    Minutes of Exercise per Session: 60 min  Stress: No Stress Concern Present (08/27/2023)   [employer] of Occupational Health - Occupational Stress Questionnaire    Feeling of Stress: Not at all  Social Connections: Moderately Isolated (08/27/2023)   Social Connection and Isolation Panel    Frequency of Communication with Friends and Family: Once a week    Frequency of Social Gatherings with Friends and Family: Never    Attends Religious Services: 1 to 4 times per year    Active Member of [employer] or Organizations: No    Attends Engineer, structural: Never    Marital Status: Married    Tobacco Counseling Counseling given: Not Answered    Clinical Intake:  Pre-visit preparation completed: Yes  Pain : No/denies pain     Nutritional Status: BMI > 30  Obese Nutritional Risks: None Diabetes: Yes CBG done?: No Did pt. bring in CBG monitor from home?: No  Lab Results  Component Value Date   HGBA1C 6.1 08/09/2023   HGBA1C 6.2 04/09/2023   HGBA1C 6.1 11/18/2022     How often do you need to have someone help you when you read instructions, pamphlets, or other written materials from your doctor or pharmacy?: 1 - Never  Interpreter Needed?: No  Information entered by :: NAllen LPN   Activities of Daily Living     08/27/2023    8:31 AM   In your present state of health, do you have any difficulty performing the following activities:  Hearing? 0  Vision? 0  Difficulty concentrating or making decisions? 0  Walking or climbing stairs? 0  Dressing or bathing? 0  Doing errands, shopping? 0  Preparing Food and eating ? N  Using the Toilet? N  In the past six months, have you accidently leaked urine? N  Do you have problems with loss of bowel control? N  Managing your Medications? N  Managing your Finances? N  Housekeeping or managing your Housekeeping? N    Patient Care Team: Nche, Roselie Rockford, NP as PCP - General (Internal Medicine) Verlin Lonni BIRCH, MD as PCP - Cardiology (Cardiology)  I have updated your Care Teams any recent Medical Services you may have received from other providers in the past year.     Assessment:   This is a routine wellness examination for Diamond Hughes.  Hearing/Vision screen Hearing Screening - Comments:: Denies hearing issues Vision Screening - Comments:: Regular eye exams, [facility]   Goals Addressed             This Visit's Progress    Patient Stated       08/27/2023, wants to work on toning and eat right       Depression Screen     08/27/2023    8:40 AM 04/28/2023   10:36 AM 04/09/2023   11:50 AM 08/24/2022    9:27 AM 07/02/2022   10:24 AM 05/07/2020    9:11 AM 03/13/2020    1:44 PM  PHQ 2/9 Scores  PHQ - 2 Score 0 2 2 0 0 1 0  PHQ- 9 Score 1 3 3  0       Fall Risk     08/27/2023    8:38 AM 04/28/2023   10:35 AM 04/09/2023   11:50 AM 08/24/2022    9:26 AM 07/02/2022   10:24 AM  Fall Risk   Falls in the past year? 1 0 1 0 0  Comment  feel down steps      Number falls in past yr: 0 0 0 0 0  Injury with Fall? 1 0 1 0 0  Comment tore ACL      Risk for fall due to : Medication side effect No Fall Risks History of fall(s) Medication side effect No Fall Risks  Follow up Falls prevention discussed;Falls evaluation completed Falls evaluation completed Falls evaluation  completed;Falls prevention discussed Falls prevention discussed;Falls evaluation completed Falls evaluation completed    MEDICARE RISK AT HOME:  Medicare Risk at Home Any stairs in or around the home?: Yes If so, are there any without handrails?: No Home free of loose throw rugs in walkways, pet beds, electrical cords, etc?: Yes Adequate lighting in your home to reduce risk of falls?: Yes Life alert?: No Use of a cane, walker or w/c?: No Grab bars in the bathroom?: No Shower chair or bench in shower?: No Elevated toilet seat or a handicapped toilet?: No  TIMED UP AND GO:  Was the test performed?  Yes  Length of time to ambulate 10 feet: 5 sec Gait steady and fast without use of assistive device  Cognitive Function: 6CIT completed        08/27/2023    8:40 AM 08/24/2022    9:30 AM  6CIT Screen  What Year? 0 points 0 points  What month? 0 points 0 points  What time? 0 points 0 points  Count back from 20 0 points 0 points  Months in reverse 0 points 0 points  Repeat phrase 0 points 2 points  Total Score 0 points 2 points    Immunizations Immunization History  Administered Date(s) Administered   PFIZER(Purple Top)SARS-COV-2 Vaccination 01/23/2019, 02/11/2019, 03/04/2019, 12/04/2019    Screening Tests Health Maintenance  Topic Date Due   Pneumococcal Vaccine: 50+ Years (1 of 2 - PCV) Never done   Zoster Vaccines- Shingrix (1 of 2) Never done   COVID-19 Vaccine (5 - 2024-25 season) 09/06/2022   OPHTHALMOLOGY EXAM  03/06/2023   INFLUENZA VACCINE  08/06/2023   DTaP/Tdap/Td (1 - Tdap) 04/27/2024 (Originally 05/03/1969)   MAMMOGRAM  11/16/2023   HEMOGLOBIN A1C  02/09/2024   Diabetic kidney evaluation - eGFR measurement  08/08/2024   Diabetic kidney evaluation - Urine ACR  08/08/2024   FOOT EXAM  08/08/2024   Medicare Annual Wellness (AWV)  08/26/2024   Fecal DNA (Cologuard)  03/30/2026   DEXA SCAN  Completed   Hepatitis C Screening  Completed   HPV VACCINES  Aged Out    Meningococcal B Vaccine  Aged Out   Colonoscopy  Discontinued    Health Maintenance  Health Maintenance Due  Topic Date Due   Pneumococcal Vaccine: 50+ Years (1 of 2 - PCV) Never done   Zoster Vaccines- Shingrix (1 of 2) Never done   COVID-19 Vaccine (5 - 2024-25 season) 09/06/2022   OPHTHALMOLOGY EXAM  03/06/2023   INFLUENZA VACCINE  08/06/2023   Health Maintenance Items Addressed: Declines vaccines. Going to get eyes checked soon.  Additional Screening:  Vision Screening: Recommended annual ophthalmology exams for early detection of glaucoma and other disorders of the eye. Would you like a referral to an eye doctor? No    Dental Screening: Recommended annual dental exams for proper oral hygiene  Community Resource Referral / Chronic Care Management: CRR required this visit?  No   CCM required this visit?  No   Plan:    I have personally reviewed and noted the following in the patient's  chart:   Medical and social history Use of alcohol, tobacco or illicit drugs  Current medications and supplements including opioid prescriptions. Patient is not currently taking opioid prescriptions. Functional ability and status Nutritional status Physical activity Advanced directives List of other physicians Hospitalizations, surgeries, and ER visits in previous 12 months Vitals Screenings to include cognitive, depression, and falls Referrals and appointments  In addition, I have reviewed and discussed with patient certain preventive protocols, quality metrics, and best practice recommendations. A written personalized care plan for preventive services as well as general preventive health recommendations were provided to patient.   Ardella FORBES Dawn, LPN   [id number]   After Visit Summary: (In Person-Printed) AVS printed and given to the patient  Notes: Nothing significant to report at this time.

## 2023-08-27 NOTE — Telephone Encounter (Signed)
 Patient needs refills of ezetimibe  and nitroglycerin  tabs

## 2023-08-31 NOTE — Telephone Encounter (Signed)
 Refill for Nitroglcerin LR  HX provider LOV 08/09/23 FOV  11/10/23   Please review and advise.  Thanks. Dm/cma

## 2023-08-31 NOTE — Addendum Note (Signed)
 Addended by: KYM KARNA CROME on: 08/31/2023 02:57 PM   Modules accepted: Orders

## 2023-09-01 MED ORDER — NITROGLYCERIN 0.4 MG SL SUBL
0.4000 mg | SUBLINGUAL_TABLET | SUBLINGUAL | 3 refills | Status: AC | PRN
Start: 2023-09-01 — End: ?

## 2023-09-01 NOTE — Addendum Note (Signed)
 Addended by: Mykela Mewborn on: 09/01/2023 10:05 AM   Modules accepted: Orders

## 2023-09-29 LAB — HM DIABETES EYE EXAM

## 2023-10-14 ENCOUNTER — Other Ambulatory Visit: Payer: Self-pay | Admitting: Nurse Practitioner

## 2023-10-14 DIAGNOSIS — I10 Essential (primary) hypertension: Secondary | ICD-10-CM

## 2023-10-14 DIAGNOSIS — E0821 Diabetes mellitus due to underlying condition with diabetic nephropathy: Secondary | ICD-10-CM

## 2023-10-18 ENCOUNTER — Encounter: Payer: Self-pay | Admitting: Nurse Practitioner

## 2023-11-08 ENCOUNTER — Other Ambulatory Visit: Payer: Self-pay | Admitting: Nurse Practitioner

## 2023-11-08 DIAGNOSIS — I10 Essential (primary) hypertension: Secondary | ICD-10-CM

## 2023-11-10 ENCOUNTER — Ambulatory Visit: Admitting: Nurse Practitioner

## 2023-11-10 ENCOUNTER — Encounter: Payer: Self-pay | Admitting: Nurse Practitioner

## 2023-11-10 VITALS — BP 134/72 | HR 81 | Temp 96.9°F | Ht 63.0 in | Wt 180.4 lb

## 2023-11-10 DIAGNOSIS — K5909 Other constipation: Secondary | ICD-10-CM

## 2023-11-10 DIAGNOSIS — I10 Essential (primary) hypertension: Secondary | ICD-10-CM | POA: Diagnosis not present

## 2023-11-10 DIAGNOSIS — E669 Obesity, unspecified: Secondary | ICD-10-CM | POA: Diagnosis not present

## 2023-11-10 DIAGNOSIS — M109 Gout, unspecified: Secondary | ICD-10-CM

## 2023-11-10 DIAGNOSIS — Z6831 Body mass index (BMI) 31.0-31.9, adult: Secondary | ICD-10-CM | POA: Diagnosis not present

## 2023-11-10 DIAGNOSIS — E785 Hyperlipidemia, unspecified: Secondary | ICD-10-CM

## 2023-11-10 DIAGNOSIS — E1169 Type 2 diabetes mellitus with other specified complication: Secondary | ICD-10-CM | POA: Diagnosis not present

## 2023-11-10 LAB — POCT GLYCOSYLATED HEMOGLOBIN (HGB A1C): Hemoglobin A1C: 5.6 % (ref 4.0–5.6)

## 2023-11-10 LAB — MICROALBUMIN / CREATININE URINE RATIO
Creatinine,U: 18.6 mg/dL
Microalb Creat Ratio: 276.8 mg/g — ABNORMAL HIGH (ref 0.0–30.0)
Microalb, Ur: 5.2 mg/dL — ABNORMAL HIGH (ref 0.0–1.9)

## 2023-11-10 MED ORDER — EZETIMIBE 10 MG PO TABS: 10.0000 mg | ORAL_TABLET | Freq: Every day | ORAL | 3 refills | Status: AC

## 2023-11-10 MED ORDER — ALLOPURINOL 100 MG PO TABS: 100.0000 mg | ORAL_TABLET | Freq: Every day | ORAL | 3 refills | Status: AC

## 2023-11-10 MED ORDER — METOPROLOL TARTRATE 50 MG PO TABS: 50.0000 mg | ORAL_TABLET | Freq: Two times a day (BID) | ORAL | 3 refills | Status: AC

## 2023-11-10 NOTE — Assessment & Plan Note (Addendum)
 Ongoing for several years, but worse with use of ozempic  She admits to low fiber diet. And has not used any OVER THE COUNTER med. No previous colonoscopy Last cologuard 03/2023, 2022: negative.  Advised to maintain a high fiber diet, try OVER THE COUNTER colace or miralax. Consider use of linzess or amitiza if no improvement

## 2023-11-10 NOTE — Assessment & Plan Note (Signed)
 Hx of CAD, HYPERTENSION, hyperlipidemia, and DIABETES. BP at goal Controlled Dm with ozempic  LDL not at goal with atorvastatin  Wt Readings from Last 3 Encounters:  11/10/23 180 lb 6.4 oz (81.8 kg)  08/27/23 180 lb 6.4 oz (81.8 kg)  08/09/23 181 lb 6.4 oz (82.3 kg)    We discussed the importance of heart healthy diet-e.g. mediterranean diet and daily exercise .e.g. low impact cardio and resistant training.

## 2023-11-10 NOTE — Assessment & Plan Note (Signed)
 Repeat hgbA1c at 5.6% with ozempic  Reports constipation LDL at goal with atorvastatin , has not taken zetia  in last 3months UTD with eye exam BP at goal Repeat UACr today

## 2023-11-10 NOTE — Progress Notes (Signed)
 Established Patient Visit  Patient: Diamond Hughes   DOB: 1950/07/29   73 y.o. Female  MRN: [id number] Visit Date: 11/10/2023  Subjective:    Chief Complaint  Patient presents with   Follow-up    3 month follow up for HTN, DM, Hyperlipidemia  Constipation issues    HPI Chronic constipation Ongoing for several years, but worse with use of ozempic  She admits to low fiber diet. And has not used any OVER THE COUNTER med. No previous colonoscopy Last cologuard 03/2023, 2022: negative.  Advised to maintain a high fiber diet, try OVER THE COUNTER colace or miralax. Consider use of linzess or amitiza if no improvement  Morbid obesity (HCC) Hx of CAD, HYPERTENSION, hyperlipidemia, and DIABETES. BP at goal Controlled Dm with ozempic  LDL not at goal with atorvastatin  Wt Readings from Last 3 Encounters:  11/10/23 180 lb 6.4 oz (81.8 kg)  08/27/23 180 lb 6.4 oz (81.8 kg)  08/09/23 181 lb 6.4 oz (82.3 kg)    We discussed the importance of heart healthy diet-e.g. mediterranean diet and daily exercise .e.g. low impact cardio and resistant training.  DM (diabetes mellitus) (HCC) Repeat hgbA1c at 5.6% with ozempic  Reports constipation LDL at goal with atorvastatin , has not taken zetia  in last 3months UTD with eye exam BP at goal Repeat UACr today  Wt Readings from Last 3 Encounters:  11/10/23 180 lb 6.4 oz (81.8 kg)  08/27/23 180 lb 6.4 oz (81.8 kg)  08/09/23 181 lb 6.4 oz (82.3 kg)    BP Readings from Last 3 Encounters:  11/10/23 134/72  08/27/23 122/68  08/09/23 136/66    Reviewed medical, surgical, and social history today  Medications: Outpatient Medications Prior to Visit  Medication Sig   amLODipine  (NORVASC ) 10 MG tablet Take 1 tablet (10 mg total) by mouth every evening.   aspirin  81 MG tablet Take 1 tablet (81 mg total) by mouth daily.   atorvastatin  (LIPITOR ) 80 MG tablet Take 1 tablet (80 mg total) by mouth daily at 6 PM.   Blood Glucose  Monitoring Suppl DEVI 1 each by Does not apply route daily before breakfast. May substitute to any manufacturer covered by patient's insurance.   clopidogrel  (PLAVIX ) 75 MG tablet Take 1 tablet (75 mg total) by mouth daily with breakfast.   co-enzyme Q-10 30 MG capsule Take 30 mg by mouth 3 (three) times daily.   Glucose Blood (BLOOD GLUCOSE TEST STRIPS) STRP 1 each by In Vitro route daily before breakfast. May substitute to any manufacturer covered by patient's insurance.   levothyroxine  (SYNTHROID ) 75 MCG tablet Take 1 tablet (75 mcg total) by mouth daily before breakfast.   lisinopril  (ZESTRIL ) 40 MG tablet TAKE 1 TABLET(40 MG) BY MOUTH DAILY   Misc Natural Products (BEET ROOT PO) Take by mouth. Powder mixed with water   nitroGLYCERIN  (NITROSTAT ) 0.4 MG SL tablet Place 1 tablet (0.4 mg total) under the tongue every 5 (five) minutes x 3 doses as needed for chest pain.   Omega-3 Fatty Acids (OMEGA-3 PO) Take 1 capsule by mouth daily.   Semaglutide ,0.25 or 0.5MG /DOS, (OZEMPIC , 0.25 OR 0.5 MG/DOSE,) 2 MG/3ML SOPN Inject 0.5 mg into the skin once a week.   VITAMIN D  PO Take by mouth.   [DISCONTINUED] allopurinol  (ZYLOPRIM ) 100 MG tablet TAKE 1 TABLET(100 MG) BY MOUTH DAILY   [DISCONTINUED] metoprolol  tartrate (LOPRESSOR ) 50 MG tablet TAKE 1 TABLET(50 MG) BY MOUTH TWICE DAILY   [  DISCONTINUED] ezetimibe  (ZETIA ) 10 MG tablet TAKE 1 TABLET BY MOUTH EVERY DAY   [DISCONTINUED] fluticasone  (FLONASE ) 50 MCG/ACT nasal spray SHAKE LIQUID AND USE 2 SPRAYS IN EACH NOSTRIL DAILY (Patient not taking: Reported on 11/10/2023)   [DISCONTINUED] hydrocortisone  (ANUSOL -HC) 2.5 % rectal cream Place 1 Application rectally 2 (two) times daily. (Patient not taking: Reported on 11/10/2023)   [DISCONTINUED] loratadine  (CLARITIN ) 10 MG tablet Take 1 tablet (10 mg total) by mouth daily. (Patient not taking: Reported on 11/10/2023)   [DISCONTINUED] metFORMIN  (GLUCOPHAGE ) 500 MG tablet Take 0.5 tablets (250 mg total) by mouth daily  with breakfast. (Patient not taking: Reported on 11/10/2023)   No facility-administered medications prior to visit.   Reviewed past medical and social history.   ROS per HPI above      Objective:  BP 134/72 (BP Location: Left Arm, Patient Position: Sitting, Cuff Size: Large)   Pulse 81   Temp (!) 96.9 F (36.1 C) (Temporal)   Ht 5' 3 (1.6 m)   Wt 180 lb 6.4 oz (81.8 kg)   SpO2 99%   BMI 31.96 kg/m      Physical Exam Vitals and nursing note reviewed.  Cardiovascular:     Rate and Rhythm: Normal rate and regular rhythm.     Pulses: Normal pulses.     Heart sounds: Normal heart sounds.  Pulmonary:     Effort: Pulmonary effort is normal.     Breath sounds: Normal breath sounds.  Neurological:     Mental Status: She is alert and oriented to person, place, and time.     Results for orders placed or performed in visit on 11/10/23  POCT glycosylated hemoglobin (Hb A1C)  Result Value Ref Range   Hemoglobin A1C 5.6 4.0 - 5.6 %   HbA1c POC (<> result, manual entry)     HbA1c, POC (prediabetic range)     HbA1c, POC (controlled diabetic range)        Assessment & Plan:    Problem List Items Addressed This Visit     Chronic constipation   Ongoing for several years, but worse with use of ozempic  She admits to low fiber diet. And has not used any OVER THE COUNTER med. No previous colonoscopy Last cologuard 03/2023, 2022: negative.  Advised to maintain a high fiber diet, try OVER THE COUNTER colace or miralax. Consider use of linzess or amitiza if no improvement      DM (diabetes mellitus) (HCC) - Primary   Repeat hgbA1c at 5.6% with ozempic  Reports constipation LDL at goal with atorvastatin , has not taken zetia  in last 3months UTD with eye exam BP at goal Repeat UACr today      Relevant Orders   Microalbumin / creatinine urine ratio   POCT glycosylated hemoglobin (Hb A1C) (Completed)   Essential hypertension   Relevant Medications   metoprolol  tartrate  (LOPRESSOR ) 50 MG tablet   ezetimibe  (ZETIA ) 10 MG tablet   Gouty arthritis of right great toe   Relevant Medications   allopurinol  (ZYLOPRIM ) 100 MG tablet   Hyperlipidemia associated with type 2 diabetes mellitus (HCC)   Relevant Medications   metoprolol  tartrate (LOPRESSOR ) 50 MG tablet   ezetimibe  (ZETIA ) 10 MG tablet   Morbid obesity (HCC)   Hx of CAD, HYPERTENSION, hyperlipidemia, and DIABETES. BP at goal Controlled Dm with ozempic  LDL not at goal with atorvastatin  Wt Readings from Last 3 Encounters:  11/10/23 180 lb 6.4 oz (81.8 kg)  08/27/23 180 lb 6.4 oz (81.8 kg)  08/09/23  181 lb 6.4 oz (82.3 kg)    We discussed the importance of heart healthy diet-e.g. mediterranean diet and daily exercise .e.g. low impact cardio and resistant training.      Return in about 6 months (around 05/09/2024) for Hypothyroidism, DM, HTN, hyperlipidemia (fasting).     Roselie Mood, NP

## 2023-11-10 NOTE — Patient Instructions (Signed)
 Try colace 100mg  1-2tabs daily or miralax 17g daily for constipation Maintain a high fiber diet and adequate oral hydration. Call office if no improvement in 1week.  Go to lab for urine collection Maintain current med doses

## 2023-11-16 ENCOUNTER — Ambulatory Visit: Payer: Self-pay | Admitting: Nurse Practitioner

## 2023-11-18 ENCOUNTER — Other Ambulatory Visit: Payer: Self-pay | Admitting: Nurse Practitioner

## 2023-11-18 DIAGNOSIS — Z1231 Encounter for screening mammogram for malignant neoplasm of breast: Secondary | ICD-10-CM

## 2023-11-22 ENCOUNTER — Other Ambulatory Visit: Payer: Self-pay | Admitting: Physician Assistant

## 2023-11-22 ENCOUNTER — Ambulatory Visit
Admission: RE | Admit: 2023-11-22 | Discharge: 2023-11-22 | Disposition: A | Payer: Medicare Other | Source: Ambulatory Visit | Attending: Nurse Practitioner

## 2023-11-22 DIAGNOSIS — Z1231 Encounter for screening mammogram for malignant neoplasm of breast: Secondary | ICD-10-CM

## 2023-11-25 ENCOUNTER — Other Ambulatory Visit: Payer: Self-pay | Admitting: Nurse Practitioner

## 2023-11-25 DIAGNOSIS — R928 Other abnormal and inconclusive findings on diagnostic imaging of breast: Secondary | ICD-10-CM

## 2023-11-29 ENCOUNTER — Encounter: Payer: Self-pay | Admitting: Cardiovascular Disease

## 2023-11-29 ENCOUNTER — Ambulatory Visit: Attending: Cardiovascular Disease | Admitting: Cardiovascular Disease

## 2023-11-29 VITALS — BP 148/88 | HR 78 | Ht 63.0 in | Wt 178.0 lb

## 2023-11-29 DIAGNOSIS — I255 Ischemic cardiomyopathy: Secondary | ICD-10-CM | POA: Diagnosis not present

## 2023-11-29 DIAGNOSIS — I10 Essential (primary) hypertension: Secondary | ICD-10-CM | POA: Diagnosis not present

## 2023-11-29 DIAGNOSIS — I2510 Atherosclerotic heart disease of native coronary artery without angina pectoris: Secondary | ICD-10-CM

## 2023-11-29 DIAGNOSIS — E785 Hyperlipidemia, unspecified: Secondary | ICD-10-CM | POA: Diagnosis not present

## 2023-11-29 MED ORDER — CLOPIDOGREL BISULFATE 75 MG PO TABS: 75.0000 mg | ORAL_TABLET | Freq: Every day | ORAL | 3 refills | Status: AC

## 2023-11-29 NOTE — Patient Instructions (Signed)

## 2023-11-29 NOTE — Progress Notes (Signed)
 Chief Complaint  Diamond Hughes presents with   Follow-up    CAD    History of Present Illness: 73 yo female with history of CAD, ischemic cardiomyopathy, PVCs, HTN and HLD here today for follow up. I have not met her before today. She has been followed in our office by multiple APPs since 2018. 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 She was admitted to [facility] in February 2018 with a NSTEMI. 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 This lesion could not be crossed by Dr. Claudene. Her Circumflex had a severe stenosis treated with a drug eluting stent. The LAD had no obstructive disease. LVEF around 45-50% in February 2018. Echo August 2020 with LVEF=55-60%. Mild MR.   She is here today for follow up. The Diamond Hughes denies any chest pain, dyspnea, palpitations, lower extremity edema, orthopnea, PND, dizziness, near syncope or syncope.   Primary Care Physician: Katheen Roselie Rockford, NP   Past Medical History:  Diagnosis Date   CAD (coronary artery disease)    a. 2000 s/p MI and RCA PCI [facility]);  b. 02/2016 NSTEMI/PCI: LM nl, LAD nl, LCX 100p (3.5x18 Resolute Onyx DES), OM1/3 small, RCA 70p aneurysmal, 50m/d ISR-->attempted PCI, could not cross w/ wire-->med rx, EF 45-50%.   Hyperlipidemia    Hypertension    Ischemic cardiomyopathy    a. 02/2016 Echo: EF 45-50%, basal-mid inferior/posterior HK, Gr1 DD, triv MR, nl LA size, nl RV fxn.   Normocytic anemia     Past Surgical History:  Procedure Laterality Date   ANGIOPLASTY  2000   CORONARY STENT INTERVENTION N/A 02/10/2016   Procedure: Coronary Stent Intervention;  Surgeon: Victory LELON Claudene, MD;  Location: [facility] INVASIVE CV LAB;  Service: Cardiovascular;  Laterality: N/A;   LEFT HEART CATH AND CORONARY ANGIOGRAPHY N/A 02/10/2016   Procedure: Left Heart Cath and Coronary Angiography;  Surgeon: Victory LELON Claudene, MD;   Location: [facility] INVASIVE CV LAB;  Service: Cardiovascular;  Laterality: N/A;    Current Outpatient Medications  Medication Sig Dispense Refill   allopurinol  (ZYLOPRIM ) 100 MG tablet Take 1 tablet (100 mg total) by mouth daily. 90 tablet 3   amLODipine  (NORVASC ) 10 MG tablet Take 1 tablet (10 mg total) by mouth every evening. 90 tablet 3   Ascorbic Acid (VITA-C PO) Take 1 capsule by mouth daily at 6 (six) AM.     aspirin  81 MG tablet Take 1 tablet (81 mg total) by mouth daily. 30 tablet 6   atorvastatin  (LIPITOR ) 80 MG tablet Take 1 tablet (80 mg total) by mouth daily at 6 PM. 90 tablet 3   Blood Glucose Monitoring Suppl DEVI 1 each by Does not apply route daily before breakfast. May substitute to any manufacturer covered by Diamond Hughes's insurance. 1 each 0   co-enzyme Q-10 30 MG capsule Take 30 mg by mouth 3 (three) times daily.     ezetimibe  (ZETIA ) 10 MG tablet Take 1 tablet (10 mg total) by mouth daily. 90 tablet 3   Glucose Blood (BLOOD GLUCOSE TEST STRIPS) STRP 1 each by In Vitro route daily before breakfast. May substitute to any manufacturer covered by Diamond Hughes's insurance. 100 strip 3   levothyroxine  (SYNTHROID ) 75 MCG tablet Take 1 tablet (75 mcg total) by mouth daily before breakfast. 90 tablet 1   lisinopril  (ZESTRIL ) 40 MG tablet TAKE 1  TABLET(40 MG) BY MOUTH DAILY 90 tablet 1   metoprolol  tartrate (LOPRESSOR ) 50 MG tablet Take 1 tablet (50 mg total) by mouth 2 (two) times daily. 180 tablet 3   Misc Natural Products (BEET ROOT PO) Take by mouth. Powder mixed with water     nitroGLYCERIN  (NITROSTAT ) 0.4 MG SL tablet Place 1 tablet (0.4 mg total) under the tongue every 5 (five) minutes x 3 doses as needed for chest pain. 25 tablet 3   Omega-3 Fatty Acids (OMEGA-3 PO) Take 1 capsule by mouth daily.     OVER THE COUNTER MEDICATION Take 1 Scoop by mouth daily at 6 (six) AM. (Diamond Hughes taking differently: Take 1 Scoop by mouth daily at 6 (six) AM. BEET ROOT SUPPLEMENT FOR HEART HEALTH.)      Semaglutide ,0.25 or 0.5MG /DOS, (OZEMPIC , 0.25 OR 0.5 MG/DOSE,) 2 MG/3ML SOPN Inject 0.5 mg into the skin once a week. 2 mL 5   VITAMIN D  PO Take by mouth.     clopidogrel  (PLAVIX ) 75 MG tablet Take 1 tablet (75 mg total) by mouth daily with breakfast. 90 tablet 3   No current facility-administered medications for this visit.    No Known Allergies  Social History   Socioeconomic History   Marital status: Married    Spouse name: Not on file   Number of children: 1   Years of education: Not on file   Highest education level: Not on file  Occupational History   Occupation: retired  Tobacco Use   Smoking status: Former    Current packs/day: 0.20    Average packs/day: 0.2 packs/day for 15.0 years (3.0 ttl pk-yrs)    Types: Cigarettes   Smokeless tobacco: Never  Vaping Use   Vaping status: Never Used  Substance and Sexual Activity   Alcohol use: Yes    Alcohol/week: 7.0 standard drinks of alcohol    Types: 7 Cans of beer per week   Drug use: No   Sexual activity: Yes    Birth control/protection: None  Other Topics Concern   Not on file  Social History Narrative   Not on file   Social Drivers of Health   Financial Resource Strain: Low Risk  (08/27/2023)   Overall Financial Resource Strain (CARDIA)    Difficulty of Paying Living Expenses: Not hard at all  Food Insecurity: No Food Insecurity (08/27/2023)   Hunger Vital Sign    Worried About Running Out of Food in the Last Year: Never true    Ran Out of Food in the Last Year: Never true  Transportation Needs: No Transportation Needs (08/27/2023)   PRAPARE - Transportation    Lack of Transportation (Medical): No    Lack of Transportation (Non-Medical): No  Physical Activity: Sufficiently Active (08/27/2023)   Exercise Vital Sign    Days of Exercise per Week: 3 days    Minutes of Exercise per Session: 60 min  Stress: No Stress Concern Present (08/27/2023)   [employer] of Occupational Health - Occupational Stress  Questionnaire    Feeling of Stress: Not at all  Social Connections: Moderately Isolated (08/27/2023)   Social Connection and Isolation Panel    Frequency of Communication with Friends and Family: Once a week    Frequency of Social Gatherings with Friends and Family: Never    Attends Religious Services: 1 to 4 times per year    Active Member of [employer] or Organizations: No    Attends Banker Meetings: Never    Marital Status: Married  Intimate  Partner Violence: Not At Risk (08/27/2023)   Humiliation, Afraid, Rape, and Kick questionnaire    Fear of Current or Ex-Partner: No    Emotionally Abused: No    Physically Abused: No    Sexually Abused: No    Family History  Problem Relation Age of Onset   Heart disease Mother    Hyperlipidemia Mother    Hypertension Mother    Hyperlipidemia Sister    Hypertension Sister    Breast cancer Maternal Grandmother     Review of Systems:  As stated in the HPI and otherwise negative.   BP (!) 148/88   Pulse 78   Ht 5' 3 (1.6 m)   Wt 178 lb (80.7 kg)   SpO2 95%   BMI 31.53 kg/m   Physical Examination: General: Well developed, well nourished, NAD  HEENT: OP clear, mucus membranes moist  SKIN: warm, dry. No rashes. Neuro: No focal deficits  Musculoskeletal: Muscle strength 5/5 all ext  Psychiatric: Mood and affect normal  Neck: No JVD, no carotid bruits, no thyromegaly, no lymphadenopathy.  Lungs:Clear bilaterally, no wheezes, rhonci, crackles Cardiovascular: Regular rate and rhythm. No murmurs, gallops or rubs. Abdomen:Soft. Bowel sounds present. Non-tender.  Extremities: No lower extremity edema. Pulses are 2 + in the bilateral DP/PT.  EKG:  EKG is ordered today. The ekg ordered today demonstrates  EKG Interpretation Date/Time:  Monday November 29 2023 10:20:26 EST Ventricular Rate:  78 PR Interval:  186 QRS Duration:  72 QT Interval:  410 QTC Calculation: 467 R Axis:   -10  Text Interpretation: Normal sinus rhythm  Normal ECG Confirmed by Verlin Bruckner [phone]) on 11/29/2023 10:34:43 AM   Recent Labs: 08/09/2023: ALT 17; BUN 16; Creatinine, Ser 0.55; Potassium 4.0; Sodium 136; TSH 2.00   Lipid Panel    Component Value Date/Time   CHOL 178 11/18/2022 1009   CHOL 161 11/30/2018 0923   TRIG 129.0 11/18/2022 1009   HDL 79.60 11/18/2022 1009   HDL 72 11/30/2018 0923   CHOLHDL 2 11/18/2022 1009   VLDL 25.8 11/18/2022 1009   LDLCALC 73 11/18/2022 1009   LDLCALC 70 11/30/2018 0923   LDLDIRECT 60.0 08/09/2023 1128    Wt Readings from Last 3 Encounters:  11/29/23 178 lb (80.7 kg)  11/10/23 180 lb 6.4 oz (81.8 kg)  08/27/23 180 lb 6.4 oz (81.8 kg)    Assessment and Plan:   1. CAD without angina: No chest pain suggestive of angina. Continue ASA, Plavix , Lipitor , Zetia  and metoprolol .   2. HTN: BP is well controlled at home. Continue Norvasc , Lisinopril  and metoprolol .   3. HLD: LDL 60 in August 2025.   4. Ischemic cardiomyopathy: LVEF normal by echo in 2020.   5. Mitral regurgitation: Mild by echo in 2020. No loud murmurs on exam.   Labs/ tests ordered today include:   Orders Placed This Encounter  Procedures   EKG 12-Lead   Disposition:   F/U with me in 12 months  Signed, Bruckner Verlin, MD, [facility] 11/29/2023 10:55 AM    [facility] [address] [place], [place], [place]  [address] Phone: [phone]; Fax: [phone]

## 2023-12-10 ENCOUNTER — Other Ambulatory Visit

## 2023-12-10 ENCOUNTER — Encounter

## 2023-12-20 ENCOUNTER — Inpatient Hospital Stay: Admission: RE | Admit: 2023-12-20 | Discharge: 2023-12-20 | Attending: Nurse Practitioner

## 2023-12-20 DIAGNOSIS — R928 Other abnormal and inconclusive findings on diagnostic imaging of breast: Secondary | ICD-10-CM

## 2024-01-11 NOTE — Progress Notes (Signed)
 Diamond Hughes                                          MRN: [id number]   01/11/2024   The [facility] Quality Team Specialist reviewed this patient medical record for the purposes of chart review for care gap closure. The following were reviewed: abstraction for care gap closure-glycemic status assessment.    [facility] Quality Team

## 2024-01-13 ENCOUNTER — Encounter

## 2024-01-13 ENCOUNTER — Other Ambulatory Visit

## 2024-05-09 ENCOUNTER — Ambulatory Visit: Admitting: Nurse Practitioner

## 2024-09-01 ENCOUNTER — Ambulatory Visit
